# Patient Record
Sex: Male | Born: 1970 | Race: White | Hispanic: No | Smoking: Current some day smoker
Health system: Southern US, Community
[De-identification: ages and names within clinical notes are randomized; demographics above are authoritative.]

## PROBLEM LIST (undated history)

## (undated) ENCOUNTER — Emergency Department (HOSPITAL_COMMUNITY): Admission: EM | Payer: Self-pay

## (undated) DIAGNOSIS — E669 Obesity, unspecified: Secondary | ICD-10-CM

## (undated) DIAGNOSIS — C801 Malignant (primary) neoplasm, unspecified: Secondary | ICD-10-CM

## (undated) DIAGNOSIS — Z720 Tobacco use: Secondary | ICD-10-CM

## (undated) DIAGNOSIS — E119 Type 2 diabetes mellitus without complications: Secondary | ICD-10-CM

## (undated) HISTORY — PX: KNEE SURGERY: SHX244

## (undated) HISTORY — PX: TOE SURGERY: SHX1073

---

## 1999-06-02 ENCOUNTER — Encounter: Payer: Self-pay | Admitting: *Deleted

## 1999-06-02 ENCOUNTER — Emergency Department (HOSPITAL_COMMUNITY): Admission: EM | Admit: 1999-06-02 | Discharge: 1999-06-02 | Payer: Self-pay | Admitting: Podiatry

## 1999-06-13 ENCOUNTER — Emergency Department (HOSPITAL_COMMUNITY): Admission: EM | Admit: 1999-06-13 | Discharge: 1999-06-13 | Payer: Self-pay | Admitting: Emergency Medicine

## 1999-06-13 ENCOUNTER — Encounter: Payer: Self-pay | Admitting: Emergency Medicine

## 2000-01-15 ENCOUNTER — Encounter: Payer: Self-pay | Admitting: Emergency Medicine

## 2000-01-15 ENCOUNTER — Emergency Department (HOSPITAL_COMMUNITY): Admission: EM | Admit: 2000-01-15 | Discharge: 2000-01-15 | Payer: Self-pay | Admitting: Emergency Medicine

## 2000-10-24 ENCOUNTER — Emergency Department (HOSPITAL_COMMUNITY): Admission: EM | Admit: 2000-10-24 | Discharge: 2000-10-24 | Payer: Self-pay | Admitting: Emergency Medicine

## 2004-08-10 ENCOUNTER — Emergency Department: Payer: Self-pay | Admitting: General Practice

## 2004-08-27 ENCOUNTER — Emergency Department: Payer: Self-pay | Admitting: Emergency Medicine

## 2004-11-08 ENCOUNTER — Emergency Department: Payer: Self-pay | Admitting: Emergency Medicine

## 2005-02-14 ENCOUNTER — Emergency Department: Payer: Self-pay | Admitting: Emergency Medicine

## 2005-02-21 ENCOUNTER — Ambulatory Visit: Payer: Self-pay | Admitting: Orthopaedic Surgery

## 2005-03-16 ENCOUNTER — Ambulatory Visit: Payer: Self-pay | Admitting: Orthopaedic Surgery

## 2005-12-02 ENCOUNTER — Emergency Department: Payer: Self-pay | Admitting: Emergency Medicine

## 2006-05-10 ENCOUNTER — Emergency Department: Payer: Self-pay | Admitting: Emergency Medicine

## 2006-09-14 ENCOUNTER — Emergency Department: Payer: Self-pay

## 2007-07-15 ENCOUNTER — Emergency Department: Payer: Self-pay | Admitting: Emergency Medicine

## 2009-01-14 ENCOUNTER — Ambulatory Visit: Payer: Self-pay | Admitting: Internal Medicine

## 2009-02-21 ENCOUNTER — Emergency Department: Payer: Self-pay | Admitting: Emergency Medicine

## 2009-07-03 ENCOUNTER — Emergency Department: Payer: Self-pay | Admitting: Emergency Medicine

## 2010-04-17 ENCOUNTER — Emergency Department: Payer: Self-pay | Admitting: Emergency Medicine

## 2010-07-19 ENCOUNTER — Emergency Department: Payer: Self-pay | Admitting: Emergency Medicine

## 2011-04-12 ENCOUNTER — Emergency Department: Payer: Self-pay | Admitting: Emergency Medicine

## 2011-05-01 ENCOUNTER — Emergency Department (HOSPITAL_COMMUNITY): Payer: Self-pay

## 2011-05-01 ENCOUNTER — Observation Stay (HOSPITAL_COMMUNITY)
Admission: EM | Admit: 2011-05-01 | Discharge: 2011-05-02 | Disposition: A | Discharge status: Home / Self Care | Payer: Self-pay | Attending: Internal Medicine | Admitting: Internal Medicine

## 2011-05-01 DIAGNOSIS — R079 Chest pain, unspecified: Principal | ICD-10-CM | POA: Insufficient documentation

## 2011-05-01 DIAGNOSIS — F17200 Nicotine dependence, unspecified, uncomplicated: Secondary | ICD-10-CM | POA: Insufficient documentation

## 2011-05-01 DIAGNOSIS — E119 Type 2 diabetes mellitus without complications: Secondary | ICD-10-CM | POA: Insufficient documentation

## 2011-05-01 DIAGNOSIS — Z23 Encounter for immunization: Secondary | ICD-10-CM | POA: Insufficient documentation

## 2011-05-01 DIAGNOSIS — E785 Hyperlipidemia, unspecified: Secondary | ICD-10-CM | POA: Insufficient documentation

## 2011-05-01 LAB — CBC
HCT: 43.3 % (ref 39.0–52.0)
MCV: 81.7 fL (ref 78.0–100.0)
RBC: 5.3 MIL/uL (ref 4.22–5.81)
RDW: 14.3 % (ref 11.5–15.5)
WBC: 10.5 10*3/uL (ref 4.0–10.5)

## 2011-05-01 LAB — DIFFERENTIAL
Basophils Absolute: 0 10*3/uL (ref 0.0–0.1)
Basophils Relative: 0 % (ref 0–1)
Eosinophils Absolute: 0.1 10*3/uL (ref 0.0–0.7)
Eosinophils Relative: 1 % (ref 0–5)
Lymphocytes Relative: 26 % (ref 12–46)
Lymphs Abs: 2.7 10*3/uL (ref 0.7–4.0)
Monocytes Absolute: 0.5 10*3/uL (ref 0.1–1.0)
Monocytes Relative: 5 % (ref 3–12)
Neutro Abs: 7.2 10*3/uL (ref 1.7–7.7)
Neutrophils Relative %: 69 % (ref 43–77)

## 2011-05-01 LAB — POCT I-STAT TROPONIN I
Troponin i, poc: 0 ng/mL (ref 0.00–0.08)
Troponin i, poc: 0 ng/mL (ref 0.00–0.08)

## 2011-05-01 LAB — COMPREHENSIVE METABOLIC PANEL
BUN: 9 mg/dL (ref 6–23)
CO2: 23 mEq/L (ref 19–32)
Chloride: 96 mEq/L (ref 96–112)
Creatinine, Ser: 0.62 mg/dL (ref 0.50–1.35)
GFR calc non Af Amer: >90 mL/min (ref >90)
Total Bilirubin: 0.5 mg/dL (ref 0.3–1.2)

## 2011-05-01 MED ORDER — IOHEXOL 300 MG/ML  SOLN
100.0000 mL | Freq: Once | INTRAMUSCULAR | Status: AC | PRN
  Administered 2011-05-01: 100 mL via INTRAVENOUS

## 2011-05-02 DIAGNOSIS — R072 Precordial pain: Secondary | ICD-10-CM

## 2011-05-02 LAB — BASIC METABOLIC PANEL
BUN: 11 mg/dL (ref 6–23)
Chloride: 99 mEq/L (ref 96–112)
Creatinine, Ser: 0.77 mg/dL (ref 0.50–1.35)
GFR calc Af Amer: >90 mL/min (ref >90)
Glucose, Bld: 159 mg/dL — ABNORMAL HIGH (ref 70–99)

## 2011-05-02 LAB — CBC
HCT: 42.1 % (ref 39.0–52.0)
Hemoglobin: 14.2 g/dL (ref 13.0–17.0)
MCV: 82.2 fL (ref 78.0–100.0)
RDW: 14.5 % (ref 11.5–15.5)
WBC: 11.2 10*3/uL — ABNORMAL HIGH (ref 4.0–10.5)

## 2011-05-02 LAB — LIPID PANEL
Cholesterol: 203 mg/dL — ABNORMAL HIGH (ref 0–200)
LDL Cholesterol: 100 mg/dL — ABNORMAL HIGH (ref 0–99)
Triglycerides: 316 mg/dL — ABNORMAL HIGH (ref <150)

## 2011-05-02 LAB — CARDIAC PANEL(CRET KIN+CKTOT+MB+TROPI)
CK, MB: 1.3 ng/mL (ref 0.3–4.0)
Relative Index: INVALID (ref 0.0–2.5)
Troponin I: <0.3 ng/mL (ref <0.30)

## 2011-05-02 LAB — TSH: TSH: 1.104 u[IU]/mL (ref 0.350–4.500)

## 2011-05-02 LAB — DIFFERENTIAL
Eosinophils Relative: 2 % (ref 0–5)
Lymphocytes Relative: 35 % (ref 12–46)
Lymphs Abs: 3.9 10*3/uL (ref 0.7–4.0)
Monocytes Absolute: 0.8 10*3/uL (ref 0.1–1.0)

## 2011-05-02 LAB — APTT: aPTT: 29 seconds (ref 24–37)

## 2011-05-02 LAB — PROTIME-INR: INR: 0.96 (ref 0.00–1.49)

## 2011-05-03 NOTE — H&P (Signed)
NAME:  [name], [name] NO.:  [id number]  MEDICAL RECORD NO.:  [id number]  LOCATION:  WLED                         FACILITY:  [facility]  PHYSICIAN:  Ramiro Harvest, MD    DATE OF BIRTH:  1971/01/28  DATE OF ADMISSION:  05/01/2011 DATE OF DISCHARGE:                             HISTORY & PHYSICAL   The patient goes to the [facility] in [place].  CHIEF COMPLAINT:  Chest pain.  HISTORY OF PRESENT ILLNESS:  Zachary Schneider is a 40 year old Caucasian gentleman with history of occasional tobacco abuse, presenting to the ED with midsternal to left substernal chest pain, described as a pressure sensation, which occurred while at work, with some numbness and tingling in the right arm and forearm.  The patient states that the pain lasted anywhere from about 3 to 4 hours and occurred while he was sitting at his computer.  It started off in the midsternal region, then went to the left substernal region.  It was nonradiating and had some associated palpitations and shortness of breath.  The patient denies any paroxysmal nocturnal dyspnea.  No orthopnea.  No diaphoresis.  No nausea.  No vomiting.  No recent travel.  No recent surgeries.  No heavy lifting. No fever.  No chills.  No nausea.  No vomiting.  No abdominal pain.  No diarrhea.  No constipation.  No dysuria.  No generalized weakness.  The patient, however, states that he had been having upper respiratory symptoms for the past 2 to 3 weeks.  He has some associated fatigue, however, those have since resolved.  The patient presented to the ED, was given some aspirin.  The patient on the time of this admission and at the time of the interview, was chest pain free.  EKG showed a normal sinus rhythm.  Initial point of care troponin was negative.  Chest x-ray was negative.  We were called to admit the patient for further evaluation and management.  ALLERGIES:  No known drug allergies.  PAST MEDICAL  HISTORY: 1. History of testicular cancer at age 32 year old and treated at     [facility]. 2. He has had bilateral knee arthroscopies. 3. He had plastic surgery on his ears bilaterally when he was 6-year-     old.  SOCIAL HISTORY:  Occasional tobacco use.  Occasional alcohol use.  No IV drug use.  Patient works as a Clinical biochemist.  FAMILY HISTORY:  Mother alive, age 9 and healthy.  Father alive, age 30 with a questionable multiple sclerosis.  REVIEW OF SYSTEMS:  As per HPI, otherwise negative.  PHYSICAL EXAMINATION:  VITAL SIGNS:  Temperature 98.8; blood pressure 158/82, down to 136/73; pulse of 98; respirations 24, down to 18; saturating 98% on room air. GENERAL:  The patient is a well-developed, well-nourished gentleman, obese in no acute cardiopulmonary distress. HEENT:  Normocephalic, atraumatic.  Pupils equal, round, reactive to light and accommodation.  Extraocular movements intact.  Oropharynx is clear.  No lesions.  No exudates. Neck:  Supple.  No lymphadenopathy. Respiratory:  Lungs are clear to auscultation bilaterally.  No wheezes. No crackles.  No rhonchi. Cardiovascular:  Regular rhythm.  No murmurs, rubs, or gallops. Abdomen:  Soft, nontender, nondistended.  Positive bowel sounds. Extremities:  No clubbing, cyanosis, or edema. Neurologic:  The patient is alert and oriented x3.  Cranial nerves II- XII are grossly intact with no focal deficits.  ADMISSION LABORATORY DATA:  CMET:  Sodium 131, potassium 3.9, chloride 96, bicarb 23, glucose 239, BUN 9, creatinine 0.62, bilirubin 0.5, alkaline phosphatase 86, AST 14, ALT 23, protein 7.4, albumin 3.5, calcium of 9.3.  CBC with a white count of 10.5, hemoglobin 14.9, hematocrit 43.3, platelet count of 245, with an ANC of 7.2.  Point of care troponin I was 0.00.  EKG shows a normal sinus rhythm.  Chest x-ray shows no active lung disease.  Minimal atelectasis at the lung bases. CT of the head without contrast shows  normal noncontrast CT appearance of the brain.  Early metastatic disease to the brain cannot be excluded in the absence of IV contrast.  ASSESSMENT AND PLAN:  Mr. Zachary Schneider is a 40 year old gentleman, history of occasional tobacco abuse, history of obesity, presented to the ED with mid to left substernal chest pain. 1. Chest pain, questionable etiology.  Patient's risk factors include     occasional tobacco abuse and obesity.  The patient does not have     any family history of premature coronary artery disease.  The     patient denies any hypertension, hyperlipidemia, or diabetes;     however, on the patient's CMET, he does have an elevated glucose     level.  We will admit for observation, cycle cardiac enzymes q.8     hours x3.  Check a fasting lipid panel.  Check a D-dimer.  Check a     TSH.  Check a hemoglobin A1c.  Check a magnesium level.  Check     coags.  Check a 2D echo.  Place on oxygen, aspirin, morphine     sulfate, nitroglycerin and a proton pump inhibitor.  If the     patient's workup is negative, we will likely benefit from     outpatient stress test. 2. Hyperglycemia.  Check a hemoglobin A1c, follow. 3. Prophylaxis.  Lovenox for DVT prophylaxis.  Protonix for GI     prophylaxis.  It has been a pleasure taking care of Mr. Zachary Schneider.     Ramiro Harvest, MD     DT/MEDQ  D:  05/01/2011  T:  05/01/2011  Job:  [id number]  cc:   [facility]  Electronically Signed by Ramiro Harvest MD on 05/03/2011 10:39:38 AM

## 2011-05-05 NOTE — Discharge Summary (Signed)
NAMEISADOR, Zachary Schneider NO.:  [id number]  MEDICAL RECORD NO.:  [id number]  LOCATION:  1428                         FACILITY:  [facility]  PHYSICIAN:  Debbora Presto, MD DATE OF BIRTH:  [birth year]  DATE OF ADMISSION:  05/01/2011 DATE OF DISCHARGE:  05/02/2011                              DISCHARGE SUMMARY   DISCHARGE MEDICATIONS: 1. Aspirin 325 mg tablet daily. 2. Lipitor 20 mg tablet daily. 3. Metformin 500 mg tablet twice daily. 4. Nitroglycerin sublingual 0.4 mg tablet every 5 minutes as needed up     to 3 doses under the tongue.  DISCHARGE DIAGNOSES: 1. Chest pain - Acute chest syndrome ruled out. 2. Hyperlipidemia. 3. Diabetes.  DISPOSITION/FOLLOWUP:  The patient was discharged from the hospital in stable condition and will need to follow up with primary care physician in approximately 4 weeks.  He actually has a physician in [place], [facility].  On followup appointment, the patient will have to discuss with primary care physician, diabetes control.  Ensure that is EKGs are within normal limits and that no further adjustment in metformin if indicated.  In addition, he will have to follow up on blood pressure control in hospital.  There was no indication for initiating treatment, but this may be required in the future.  CONSULTATIONS:  None.  HISTORY OF PRESENT ILLNESS:  The patient is very pleasant, young Caucasian male with history of tobacco abuse who presents to emergency department with substernal chest pain that occurred while at work, with numbness and tingling in the right arm.  Pain lasted anywhere from about 3-4 hours, occurred at rest and it subsided.  No specific alleviating or aggravating factors.  No shortness of breath.  No PND.  No orthopnea. No diaphoresis.  No abdominal or urinary concerns.  PHYSICAL EXAMINATION:  VITAL SIGNS:  Temperature 97.7, blood pressure 116/72, heart rate 62, saturating 97% on room  air. GENERAL:  Not in acute distress. CARDIOVASCULAR:  Regular rate rhythm.  S1, S2 present. LUNGS:  Clear to auscultation bilaterally. ABDOMEN:  Soft, nontender, nondistended.  Bowel sounds present. EXTREMITIES:  No edema. NEUROLOGIC:  Grossly nonfocal.  LABORATORY DATA:  A1c 8.5.  Sodium 133, potassium 3.7, chloride 99, bicarb 24, BUN 11, creatinine 0.77, glucose 159.  WBC 11.2, hemoglobin 14.2, and platelets 244.  CTA negative for chest or acute cardiopulmonary events.  ASSESSMENT AND PLAN: 1. Chest pain, mostly consistent with stable angina.  ACS was ruled     out.  No events on tele overnight.  Cardiac enzymes x3 were within     normal limits.  No changes on EKG noted during the hospitalization.     The patient was provided nitroglycerin and aspirin and will need to     follow up with primary care physician for potential stress test to     be done for further evaluation, given his risk factors of tobacco     abuse, diabetes, and obesity. 2. Diabetes.  This is newly diagnosed, diabetes with A1c noted above     was 8.5.  We will initiate the patient on metformin as he is     reluctant to start with insulin.  This time  we will start 500 mg     tablet twice daily, and he will need to follow up in outpatient     setting to ensure that CBG is well controlled on current medication     regimen.  We will also provided diabetic education. 3. Hyperlipidemia.  LDL goal discussed with the patient of less than     70.  We will start Lipitor 20 mg tablet once daily.  The patient     will need a follow up to check liver function tests.  The patient     was stable to be discharged. Over 30 minutes was spent on discharging the patient.     Debbora Presto, MD     IM/MEDQ  D:  05/02/2011  T:  05/02/2011  Job:  [id number]  Electronically Signed by Debbora Presto MD on 05/05/2011 10:42:46 PM

## 2011-11-01 ENCOUNTER — Emergency Department (HOSPITAL_COMMUNITY)
Admission: EM | Admit: 2011-11-01 | Discharge: 2011-11-01 | Discharge status: Against Medical Advice | Payer: Self-pay | Attending: Emergency Medicine | Admitting: Emergency Medicine

## 2011-11-01 ENCOUNTER — Encounter (HOSPITAL_COMMUNITY): Payer: Self-pay | Admitting: Emergency Medicine

## 2011-11-01 DIAGNOSIS — Z87891 Personal history of nicotine dependence: Secondary | ICD-10-CM | POA: Insufficient documentation

## 2011-11-01 DIAGNOSIS — Z859 Personal history of malignant neoplasm, unspecified: Secondary | ICD-10-CM | POA: Insufficient documentation

## 2011-11-01 DIAGNOSIS — H538 Other visual disturbances: Secondary | ICD-10-CM | POA: Insufficient documentation

## 2011-11-01 HISTORY — DX: Obesity, unspecified: E66.9

## 2011-11-01 HISTORY — DX: Malignant (primary) neoplasm, unspecified: C80.1

## 2011-11-01 NOTE — ED Notes (Signed)
States that he has blurry vision since he woke up this am no pain he states no head injury no recent loc thought it would go away it has not

## 2011-11-01 NOTE — ED Notes (Signed)
Pt presents with onset of blurred vision to L eye when he awoke this morning.  Pt describes "like a film over my eye".  Pt denies any pain, denies any numbness to face.

## 2011-11-01 NOTE — ED Notes (Signed)
Checked patient eyes pts was 20/30 in both eyes and 20/30 in the right eye both patient was 20/70 left eye

## 2011-11-01 NOTE — ED Provider Notes (Signed)
History  Scribed for Hilario Quarry, MD, the patient was seen in room STRE7/STRE7. This chart was scribed by Candelaria Stagers. The patient's care started at 1:20 PM    CSN: [id number]  Arrival date & time 11/01/11  1202   First MD Initiated Contact with Patient 11/01/11 1319      Chief Complaint  Patient presents with  . Eye Pain    HPI Zachary Schneider is a 41 y.o. male who presents to the Emergency Department complaining of blurred vision of the left eye that started this morning.  He denies injury or eye pain.  He has h/o diabetes.  He currently does not have HTN.    Past Medical History  Diagnosis Date  . Cancer   . Obese     History reviewed. No pertinent past surgical history.  No family history on file.  History  Substance Use Topics  . Smoking status: Former Games developer  . Smokeless tobacco: Not on file  . Alcohol Use: Yes      Review of Systems  Eyes: Positive for visual disturbance (blurry). Negative for pain.  All other systems reviewed and are negative.    Allergies  Review of patient's allergies indicates no known allergies.  Home Medications  No current outpatient prescriptions on file.  BP 139/80  Pulse 76  Temp 98.5 F (36.9 C)  Resp 16  SpO2 96%  Physical Exam  Nursing note and vitals reviewed. Constitutional: He appears well-developed and well-nourished.  HENT:  Head: Normocephalic and atraumatic.  Eyes: EOM are normal. Pupils are equal, round, and reactive to light. Right eye exhibits no discharge. Left eye exhibits no discharge.  Neck: Normal range of motion. Neck supple.  Musculoskeletal: Normal range of motion.  Neurological: He is alert.  Psychiatric: He has a normal mood and affect. His behavior is normal.    ED Course  Procedures   DIAGNOSTIC STUDIES: Oxygen Saturation is 96% on room air, normal by my interpretation.    COORDINATION OF CARE:  Visual Acuity test: 20/70 Left; 20/30 Right 20/30 Both    Labs Reviewed - No  data to display No results found.   No diagnosis found.   I personally performed the services described in this documentation, which was scribed in my presence. The recorded information has been reviewed and considered.  MDM   Patient with order to remove to eye room. Patient left prior to being moved to eye room for full ophthalmologic exam      Hilario Quarry, MD 11/01/11 [phone]

## 2012-04-04 ENCOUNTER — Emergency Department: Payer: Self-pay | Admitting: Emergency Medicine

## 2012-04-23 ENCOUNTER — Encounter (HOSPITAL_COMMUNITY): Payer: Self-pay | Admitting: Emergency Medicine

## 2012-04-23 ENCOUNTER — Emergency Department (HOSPITAL_COMMUNITY)
Admission: EM | Admit: 2012-04-23 | Discharge: 2012-04-23 | Disposition: A | Discharge status: Home / Self Care | Payer: Self-pay | Attending: Emergency Medicine | Admitting: Emergency Medicine

## 2012-04-23 DIAGNOSIS — Y929 Unspecified place or not applicable: Secondary | ICD-10-CM | POA: Insufficient documentation

## 2012-04-23 DIAGNOSIS — L0390 Cellulitis, unspecified: Secondary | ICD-10-CM

## 2012-04-23 DIAGNOSIS — Y939 Activity, unspecified: Secondary | ICD-10-CM | POA: Insufficient documentation

## 2012-04-23 DIAGNOSIS — D499 Neoplasm of unspecified behavior of unspecified site: Secondary | ICD-10-CM | POA: Insufficient documentation

## 2012-04-23 DIAGNOSIS — X58XXXA Exposure to other specified factors, initial encounter: Secondary | ICD-10-CM | POA: Insufficient documentation

## 2012-04-23 DIAGNOSIS — Z87891 Personal history of nicotine dependence: Secondary | ICD-10-CM | POA: Insufficient documentation

## 2012-04-23 DIAGNOSIS — IMO0002 Reserved for concepts with insufficient information to code with codable children: Secondary | ICD-10-CM | POA: Insufficient documentation

## 2012-04-23 DIAGNOSIS — F1010 Alcohol abuse, uncomplicated: Secondary | ICD-10-CM | POA: Insufficient documentation

## 2012-04-23 MED ORDER — DIPHENHYDRAMINE HCL 25 MG PO CAPS
50.0000 mg | ORAL_CAPSULE | Freq: Once | ORAL | Status: AC
  Administered 2012-04-23: 50 mg via ORAL
  Filled 2012-04-23: qty 2

## 2012-04-23 MED ORDER — HYDROCODONE-ACETAMINOPHEN 5-325 MG PO TABS: 2.0000 | ORAL_TABLET | ORAL | Status: DC | PRN

## 2012-04-23 MED ORDER — DIPHENHYDRAMINE HCL 25 MG PO TABS: 25.0000 mg | ORAL_TABLET | Freq: Four times a day (QID) | ORAL | Status: DC

## 2012-04-23 MED ORDER — SULFAMETHOXAZOLE-TRIMETHOPRIM 800-160 MG PO TABS: 1.0000 | ORAL_TABLET | Freq: Two times a day (BID) | ORAL | Status: DC

## 2012-04-23 NOTE — ED Notes (Signed)
Pt states that around 1430 today, he began noticing a red, raised area on his arms that he thinks is an insect bite.  Pt does not remember being stung.

## 2012-04-23 NOTE — ED Provider Notes (Signed)
History  This chart was scribed for non-physician practitioner working with Richardean Canal, MD by Shari Heritage. This patient was seen in room WTR6/WTR6 and the patient's care was started at 1832.   CSN: [id number]  Arrival date & time 04/23/12  1557   First MD Initiated Contact with Patient 04/23/12 1832      Chief Complaint  Patient presents with  . Insect Bite    The history is provided by the patient. No language interpreter was used.   HPI Comments: Zachary Schneider is a 41 y.o. male who presents to the Emergency Department complaining of erythematous area to the right forearm with associated burning, moderate pain onset 4 hours ago. Patient states that the pain is localized to the forearm. Patient denies throat closing, trouble swallowing, SOB or wheezing. He may have been stung by something, but he did not see an insect so is unsure of the exact mechanism. Pain is aggravated mildly with palpation. Patient hasn't tried anything for symptom relief. Patient states that he has had a similar, more severe reaction after a spider bite. He was eventually treated with antibiotics at that time. Patient has a medical history of cancer. Patient has an allergy to bee venom.  Past Medical History  Diagnosis Date  . Cancer   . Obese     Past Surgical History  Procedure Date  . Knee surgery     No family history on file.  History  Substance Use Topics  . Smoking status: Former Games developer  . Smokeless tobacco: Not on file  . Alcohol Use: Yes      Review of Systems  HENT: Negative for trouble swallowing.   Respiratory: Negative for shortness of breath and wheezing.   Skin: Positive for rash.  All other systems reviewed and are negative.    Allergies  Review of patient's allergies indicates no known allergies.  Home Medications   Current Outpatient Rx  Name Route Sig Dispense Refill  . OVER THE COUNTER MEDICATION Oral Take 1 tablet by mouth once. OTC pain med.      BP 118/71   Pulse 103  Temp 99.1 F (37.3 C) (Oral)  Resp 23  SpO2 97%  Physical Exam  Nursing note and vitals reviewed. Constitutional: He is oriented to person, place, and time. He appears well-developed and well-nourished. No distress.  HENT:  Head: Normocephalic and atraumatic.  Eyes: Conjunctivae normal and EOM are normal.  Neck: Normal range of motion. Neck supple.  Cardiovascular: Normal rate and regular rhythm.  Exam reveals no gallop and no friction rub.   No murmur heard. Pulmonary/Chest: Effort normal and breath sounds normal. He has no wheezes. He has no rales. He exhibits no tenderness.  Abdominal: Soft. He exhibits no distension.  Musculoskeletal: Normal range of motion.       Strength and sensation is equal and intact bilaterally of upper extremities.  Neurological: He is alert and oriented to person, place, and time. Coordination normal.       Speech is goal-oriented. Moves limbs without ataxia.   Skin: Skin is warm and dry. He is not diaphoretic. There is erythema.       4x4 cm area of erythema to volar aspect of right forearm.  Psychiatric: He has a normal mood and affect. His behavior is normal.    ED Course  Procedures (including critical care time) DIAGNOSTIC STUDIES: Oxygen Saturation is 97% on room air, adequate by my interpretation.    COORDINATION OF CARE: 7:19pm- Patient informed  of current plan for treatment and evaluation and agrees with plan at this time. Will administer Benadryl in the ED and discharge patient home with prescriptions for Vicodin 5-325mg , Benadryl 25 mg and Septra DS 800-160 mg.   Labs Reviewed - No data to display  No results found.   1. Cellulitis       MDM  Patient likely has cellulitis without abscess. I will treat him with Bactrim and her should return with worsening or concerning symptoms. Patient is afebrile. No further evaluation needed at this time.   This chart was written by a scribe in my presence. I have reviewed and agree  with the note.    Emilia Beck, PA-C 04/24/12 0136

## 2012-04-24 NOTE — ED Provider Notes (Signed)
Medical screening examination/treatment/procedure(s) were performed by non-physician practitioner and as supervising physician I was immediately available for consultation/collaboration.   Richardean Canal, MD 04/24/12 [phone]

## 2012-07-10 ENCOUNTER — Emergency Department (HOSPITAL_COMMUNITY)
Admission: EM | Admit: 2012-07-10 | Discharge: 2012-07-10 | Disposition: A | Discharge status: Home / Self Care | Payer: Worker's Compensation | Attending: Emergency Medicine | Admitting: Emergency Medicine

## 2012-07-10 ENCOUNTER — Encounter (HOSPITAL_COMMUNITY): Payer: Self-pay | Admitting: Emergency Medicine

## 2012-07-10 ENCOUNTER — Emergency Department (HOSPITAL_COMMUNITY): Payer: Worker's Compensation

## 2012-07-10 DIAGNOSIS — S8390XA Sprain of unspecified site of unspecified knee, initial encounter: Secondary | ICD-10-CM

## 2012-07-10 DIAGNOSIS — Z87891 Personal history of nicotine dependence: Secondary | ICD-10-CM | POA: Insufficient documentation

## 2012-07-10 DIAGNOSIS — Y9389 Activity, other specified: Secondary | ICD-10-CM | POA: Insufficient documentation

## 2012-07-10 DIAGNOSIS — Y929 Unspecified place or not applicable: Secondary | ICD-10-CM | POA: Insufficient documentation

## 2012-07-10 DIAGNOSIS — W010XXA Fall on same level from slipping, tripping and stumbling without subsequent striking against object, initial encounter: Secondary | ICD-10-CM | POA: Insufficient documentation

## 2012-07-10 DIAGNOSIS — IMO0002 Reserved for concepts with insufficient information to code with codable children: Secondary | ICD-10-CM | POA: Insufficient documentation

## 2012-07-10 DIAGNOSIS — Z859 Personal history of malignant neoplasm, unspecified: Secondary | ICD-10-CM | POA: Insufficient documentation

## 2012-07-10 DIAGNOSIS — E669 Obesity, unspecified: Secondary | ICD-10-CM | POA: Insufficient documentation

## 2012-07-10 MED ORDER — IBUPROFEN 600 MG PO TABS: 600.0000 mg | ORAL_TABLET | Freq: Four times a day (QID) | ORAL | Status: DC | PRN

## 2012-07-10 NOTE — ED Provider Notes (Signed)
History     CSN: [id number]  Arrival date & time 07/10/12  1201   First MD Initiated Contact with Patient 07/10/12 1256      Chief Complaint  Patient presents with  . Knee Pain    (Consider location/radiation/quality/duration/timing/severity/associated sxs/prior treatment) HPI Comments: Patient presents with complaint of right knee pain beginning acutely yesterday after he tripped on a rug. Patient has been ambulatory. He states that his knee was swollen last night. He denies numbness, tingling, weakness of his lower extremity. He has been using ice and Tylenol without relief of pain. Onset acute. Course is constant. Walking and movement makes the pain worse. Nothing makes it better.  Patient is a 42 y.o. male presenting with knee pain. The history is provided by the patient.  Knee Pain Associated symptoms include arthralgias and joint swelling. Pertinent negatives include no neck pain, numbness or weakness.    Past Medical History  Diagnosis Date  . Cancer   . Obese     Past Surgical History  Procedure Date  . Knee surgery     History reviewed. No pertinent family history.  History  Substance Use Topics  . Smoking status: Former Games developer  . Smokeless tobacco: Not on file  . Alcohol Use: Yes      Review of Systems  Constitutional: Negative for activity change.  HENT: Negative for neck pain.   Musculoskeletal: Positive for joint swelling, arthralgias and gait problem. Negative for back pain.  Skin: Negative for wound.  Neurological: Negative for weakness and numbness.    Allergies  Bee venom and Mushroom extract complex  Home Medications   Current Outpatient Rx  Name  Route  Sig  Dispense  Refill  . ACETAMINOPHEN 500 MG PO TABS   Oral   Take 500 mg by mouth every 6 (six) hours as needed. pain         . IBUPROFEN 600 MG PO TABS   Oral   Take 1 tablet (600 mg total) by mouth every 6 (six) hours as needed for pain.   20 tablet   0     BP 148/81   Pulse 75  Temp 98.2 F (36.8 C) (Oral)  Resp 18  SpO2 96%  Physical Exam  Nursing note and vitals reviewed. Constitutional: He appears well-developed and well-nourished.  HENT:  Head: Normocephalic and atraumatic.  Eyes: Conjunctivae normal are normal.  Neck: Normal range of motion. Neck supple.  Cardiovascular: Normal pulses.   Musculoskeletal: He exhibits tenderness. He exhibits no edema.       Right hip: Normal.       Right knee: He exhibits effusion (Mild). He exhibits normal range of motion and no swelling. tenderness found. Medial joint line and lateral joint line tenderness noted.       Right ankle: Normal.  Neurological: He is alert. No sensory deficit.       Motor, sensation, and vascular distal to the injury is fully intact.   Skin: Skin is warm and dry.  Psychiatric: He has a normal mood and affect.    ED Course  Procedures (including critical care time)  Labs Reviewed - No data to display Dg Knee Complete 4 Views Right  07/10/2012  *RADIOLOGY REPORT*  Clinical Data: Knee pain and swelling  RIGHT KNEE - COMPLETE 4+ VIEW  Comparison: None.  Findings:  Frontal, lateral, and bilateral oblique views were obtained.  There is no fracture, dislocation, or effusion.  There is extensive calcification in the medial collateral ligament. There  is patellofemoral joint spurring and narrowing.  No erosive change or intra-articular calcifications.  IMPRESSION:  Medial collateral ligament calcification.  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  No fracture or effusion.   Original Report Authenticated By: Bretta Bang, M.D.      1. Knee sprain     1:17 PM Patient seen and examined. Work-up initiated. Medications ordered.   Vital signs reviewed and are as follows: Filed Vitals:   07/10/12 1219  BP: 148/81  Pulse: 75  Temp: 98.2 F (36.8 C)  Resp: 18   1:18 PM Patient was counseled on RICE protocol and told to rest  injury, use ice for no longer than 15 minutes every hour, compress the area, and elevate above the level of their heart as much as possible to reduce swelling.  Questions answered.  Patient verbalized understanding.    Knee sleeve by orthopedic tech.  MDM  Knee injury, x-rays negative, patient is ambulatory. He is refused crutches. Rice protocol and conservative treatment indicated. Orthopedic followup if not improved in one week.        Renne Crigler, [place] 07/10/12 1320

## 2012-07-10 NOTE — ED Notes (Signed)
Tripped and fell yesterday landing on right knee.

## 2012-07-11 NOTE — ED Provider Notes (Signed)
Medical screening examination/treatment/procedure(s) were performed by non-physician practitioner and as supervising physician I was immediately available for consultation/collaboration.   Ger Ringenberg, MD 07/11/12 0705 

## 2012-09-12 ENCOUNTER — Emergency Department: Payer: Self-pay | Admitting: Emergency Medicine

## 2017-03-18 ENCOUNTER — Emergency Department (HOSPITAL_BASED_OUTPATIENT_CLINIC_OR_DEPARTMENT_OTHER)
Admission: EM | Admit: 2017-03-18 | Discharge: 2017-03-18 | Disposition: A | Discharge status: Home / Self Care | Payer: Self-pay | Attending: Emergency Medicine | Admitting: Emergency Medicine

## 2017-03-18 ENCOUNTER — Encounter (HOSPITAL_BASED_OUTPATIENT_CLINIC_OR_DEPARTMENT_OTHER): Payer: Self-pay | Admitting: Emergency Medicine

## 2017-03-18 ENCOUNTER — Emergency Department (HOSPITAL_BASED_OUTPATIENT_CLINIC_OR_DEPARTMENT_OTHER): Payer: Self-pay

## 2017-03-18 DIAGNOSIS — J209 Acute bronchitis, unspecified: Secondary | ICD-10-CM | POA: Insufficient documentation

## 2017-03-18 DIAGNOSIS — Z8547 Personal history of malignant neoplasm of testis: Secondary | ICD-10-CM | POA: Insufficient documentation

## 2017-03-18 DIAGNOSIS — E119 Type 2 diabetes mellitus without complications: Secondary | ICD-10-CM | POA: Insufficient documentation

## 2017-03-18 DIAGNOSIS — Z79899 Other long term (current) drug therapy: Secondary | ICD-10-CM | POA: Insufficient documentation

## 2017-03-18 DIAGNOSIS — Z87891 Personal history of nicotine dependence: Secondary | ICD-10-CM | POA: Insufficient documentation

## 2017-03-18 DIAGNOSIS — Z794 Long term (current) use of insulin: Secondary | ICD-10-CM | POA: Insufficient documentation

## 2017-03-18 HISTORY — DX: Type 2 diabetes mellitus without complications: E11.9

## 2017-03-18 MED ORDER — IPRATROPIUM-ALBUTEROL 0.5-2.5 (3) MG/3ML IN SOLN
3.0000 mL | Freq: Four times a day (QID) | RESPIRATORY_TRACT | Status: DC
  Administered 2017-03-18: 3 mL via RESPIRATORY_TRACT
  Filled 2017-03-18: qty 3

## 2017-03-18 MED ORDER — ALBUTEROL SULFATE HFA 108 (90 BASE) MCG/ACT IN AERS: 1.0000 | INHALATION_SPRAY | Freq: Four times a day (QID) | RESPIRATORY_TRACT | 0 refills | Status: DC | PRN

## 2017-03-18 MED ORDER — PREDNISONE 10 MG PO TABS: 60.0000 mg | ORAL_TABLET | Freq: Every day | ORAL | 0 refills | Status: DC

## 2017-03-18 MED ORDER — PREDNISONE 50 MG PO TABS
60.0000 mg | ORAL_TABLET | Freq: Once | ORAL | Status: AC
  Administered 2017-03-18: 60 mg via ORAL
  Filled 2017-03-18: qty 1

## 2017-03-18 MED FILL — predniSONE 10 MG TABS: 10 | 5 days supply | Qty: 30 | Fill #0

## 2017-03-18 NOTE — ED Triage Notes (Signed)
Patient states that he has had a cough x 2 -3 days. The patient reports that he has a "wheeze" and tries to cough stuff up but sometimes it will not come up

## 2017-03-18 NOTE — ED Provider Notes (Signed)
[facility] DEPT [facility] Provider Note   CSN: [id number] Arrival date & time: 03/18/17  0757     History   Chief Complaint Chief Complaint  Patient presents with  . Cough    HPI Zachary Schneider is a 46 y.o. male.  HPI  SUBJECTIVE:  Zachary Schneider is a 46 y.o. male seen urgently with cough and shortness of breath for several days that is getting worse. Pt has hx of DM, remote hx of testicular CA and smokes 1/2 ppd. Pt denies any primary lung disease or cardiac disease. Pt has no hx of PE, DVT and denies any exogenous hormone (testosterone / estrogen) use, long distance travels or surgery in the past 6 weeks, active cancer, recent immobilization.   Past Medical History:  Diagnosis Date  . Cancer ([place])   . Diabetes mellitus without complication ([place])   . Obese     There are no active problems to display for this patient.   Past Surgical History:  Procedure Laterality Date  . KNEE SURGERY         Home Medications    Prior to Admission medications   Medication Sig Start Date End Date Taking? Authorizing Provider  insulin NPH-regular Human (NOVOLIN 70/30) (70-30) 100 UNIT/ML injection Inject into the skin.   Yes [provider]  acetaminophen (TYLENOL) 500 MG tablet Take 500 mg by mouth every 6 (six) hours as needed. pain    [provider]  albuterol (PROVENTIL HFA;VENTOLIN HFA) 108 (90 Base) MCG/ACT inhaler Inhale 1-2 puffs into the lungs every 6 (six) hours as needed for wheezing or shortness of breath. 03/18/17   Varney Biles, MD  ibuprofen (ADVIL,MOTRIN) 600 MG tablet Take 1 tablet (600 mg total) by mouth every 6 (six) hours as needed for pain. 07/10/12   Carlisle Cater, PA-C  predniSONE (DELTASONE) 10 MG tablet Take 6 tablets (60 mg total) by mouth daily. 03/18/17   Varney Biles, MD    Family History History reviewed. No pertinent family history.  Social History Social History  Substance Use Topics  . Smoking status: Former Research scientist (life sciences)  .  Smokeless tobacco: Never Used  . Alcohol use Yes     Allergies   Bee venom and Mushroom extract complex   Review of Systems Review of Systems  Constitutional: Positive for activity change.  Respiratory: Positive for cough and shortness of breath. Negative for chest tightness.   Cardiovascular: Negative for chest pain and leg swelling.  Allergic/Immunologic: Negative for immunocompromised state.  All other systems reviewed and are negative.    Physical Exam Updated Vital Signs BP (!) 146/86 (BP Location: Left Arm)   Pulse 96   Temp 98.4 F (36.9 C) (Oral)   Resp 20   Ht 6' (1.829 m)   Wt (!) 143 kg (315 lb 4.1 oz)   SpO2 98%   BMI 42.76 kg/m   Physical Exam  Constitutional: He is oriented to person, place, and time. He appears well-developed.  HENT:  Head: Atraumatic.  Neck: Neck supple.  Cardiovascular: Normal rate.   Pulmonary/Chest: Effort normal. He has wheezes. He exhibits no tenderness.  Musculoskeletal: He exhibits no edema or tenderness.  Neurological: He is alert and oriented to person, place, and time.  Skin: Skin is warm.  Nursing note and vitals reviewed. OBJECTIVE:  The patient appears alert, well appearing, and in no distress, overweight, acyanotic, in no respiratory distress and well hydrated. ENT: ENT exam normal, no neck nodes or sinus tenderness CHEST:wheezing noted in all lung  fields   ED Treatments / Results  Labs (all labs ordered are listed, but only abnormal results are displayed) Labs Reviewed - No data to display  EKG  EKG Interpretation None       Radiology Dg Chest 2 View  Result Date: 03/18/2017 CLINICAL DATA:  Cough and chest congestion.  Shortness of breath. EXAM: CHEST  2 VIEW COMPARISON:  05/01/2011 FINDINGS: The heart size and mediastinal contours are within normal limits. Both lungs are clear. The visualized skeletal structures are unremarkable. IMPRESSION: Normal exam. Electronically Signed   By: Lorriane Shire M.D.    On: 03/18/2017 08:30    Procedures Procedures (including critical care time) Smoking cessation instruction/counseling given:  counseled patient on the dangers of tobacco use, advised patient to stop smoking, and reviewed strategies to maximize success. Discussion 2-3 min    Medications Ordered in ED Medications  ipratropium-albuterol (DUONEB) 0.5-2.5 (3) MG/3ML nebulizer solution 3 mL (3 mLs Nebulization Given 03/18/17 0921)  predniSONE (DELTASONE) tablet 60 mg (60 mg Oral Given 03/18/17 0919)     Initial Impression / Assessment and Plan / ED Course  I have reviewed the triage vital signs and the nursing notes.  Pertinent labs & imaging results that were available during my care of the patient were reviewed by me and considered in my medical decision making (see chart for details).  Clinical Course as of Mar 19 1015  Mon Mar 18, 2017  1016 Repeat exam reveals clearing of wheezing in all lung fields. Patient is not in any respiratory distress nor is there hypoxia.  Strict ER return precautions have been discussed, and patient is agreeing with the plan and is comfortable with the workup done and the recommendations from the ER.   [AN]    Clinical Course User Index [AN] Varney Biles, MD   Pt comes in with cc of cough. He is noted to be wheezing. Pt is a smoker and has hx of HTN, DM. No cardiac dz. No rales on exam. Pt has no PE risk factors, pretest probability for PE is low and he is PERC neg. Given the smoking hx, this could be new diagnosis of COPD. Will give nebs, get CXR and reassess.    Final Clinical Impressions(s) / ED Diagnoses   Final diagnoses:  Acute bronchitis, unspecified organism    New Prescriptions New Prescriptions   ALBUTEROL (PROVENTIL HFA;VENTOLIN HFA) 108 (90 BASE) MCG/ACT INHALER    Inhale 1-2 puffs into the lungs every 6 (six) hours as needed for wheezing or shortness of breath.   PREDNISONE (DELTASONE) 10 MG TABLET    Take 6 tablets (60 mg total) by  mouth daily.     Varney Biles, MD 03/18/17 1019

## 2017-03-18 NOTE — Discharge Instructions (Signed)
We saw you in the ER for your breathing related complains. We gave you some breathing treatments in the ER, and seems like your symptoms have improved. °Please take albuterol as needed every 4 hours. °Please take the medications prescribed. °Please refrain from smoking or smoke exposure. °Please see a primary care doctor in 1 week. °Return to the ER if your symptoms worsen. ° °

## 2017-07-14 ENCOUNTER — Encounter (HOSPITAL_COMMUNITY): Payer: Self-pay | Admitting: Emergency Medicine

## 2017-07-14 ENCOUNTER — Observation Stay (HOSPITAL_COMMUNITY)
Admission: EM | Admit: 2017-07-14 | Discharge: 2017-07-15 | Disposition: A | Discharge status: Home / Self Care | Payer: Self-pay | Attending: Internal Medicine | Admitting: Internal Medicine

## 2017-07-14 ENCOUNTER — Other Ambulatory Visit: Payer: Self-pay

## 2017-07-14 ENCOUNTER — Emergency Department (HOSPITAL_COMMUNITY): Payer: No Typology Code available for payment source

## 2017-07-14 DIAGNOSIS — R739 Hyperglycemia, unspecified: Secondary | ICD-10-CM

## 2017-07-14 DIAGNOSIS — K567 Ileus, unspecified: Secondary | ICD-10-CM | POA: Diagnosis present

## 2017-07-14 DIAGNOSIS — Z794 Long term (current) use of insulin: Secondary | ICD-10-CM | POA: Insufficient documentation

## 2017-07-14 DIAGNOSIS — Z23 Encounter for immunization: Secondary | ICD-10-CM | POA: Insufficient documentation

## 2017-07-14 DIAGNOSIS — Z79899 Other long term (current) drug therapy: Secondary | ICD-10-CM | POA: Insufficient documentation

## 2017-07-14 DIAGNOSIS — K8020 Calculus of gallbladder without cholecystitis without obstruction: Secondary | ICD-10-CM | POA: Insufficient documentation

## 2017-07-14 DIAGNOSIS — F17210 Nicotine dependence, cigarettes, uncomplicated: Secondary | ICD-10-CM | POA: Insufficient documentation

## 2017-07-14 DIAGNOSIS — E1165 Type 2 diabetes mellitus with hyperglycemia: Secondary | ICD-10-CM | POA: Insufficient documentation

## 2017-07-14 DIAGNOSIS — E119 Type 2 diabetes mellitus without complications: Secondary | ICD-10-CM

## 2017-07-14 DIAGNOSIS — K56600 Partial intestinal obstruction, unspecified as to cause: Principal | ICD-10-CM | POA: Insufficient documentation

## 2017-07-14 DIAGNOSIS — Z859 Personal history of malignant neoplasm, unspecified: Secondary | ICD-10-CM | POA: Insufficient documentation

## 2017-07-14 DIAGNOSIS — K56609 Unspecified intestinal obstruction, unspecified as to partial versus complete obstruction: Secondary | ICD-10-CM | POA: Diagnosis present

## 2017-07-14 LAB — URINALYSIS, ROUTINE W REFLEX MICROSCOPIC
BACTERIA UA: NONE SEEN
Bilirubin Urine: NEGATIVE
Glucose, UA: >=500 mg/dL — AB
HGB URINE DIPSTICK: NEGATIVE
Ketones, ur: 80 mg/dL — AB
Leukocytes, UA: NEGATIVE
NITRITE: NEGATIVE
PH: 5 (ref 5.0–8.0)
Protein, ur: 30 mg/dL — AB
RBC / HPF: NONE SEEN RBC/hpf (ref 0–5)
SPECIFIC GRAVITY, URINE: 1.028 (ref 1.005–1.030)

## 2017-07-14 LAB — COMPREHENSIVE METABOLIC PANEL
ALBUMIN: 3.5 g/dL (ref 3.5–5.0)
ALK PHOS: 74 U/L (ref 38–126)
ALT: 48 U/L (ref 17–63)
AST: 35 U/L (ref 15–41)
BILIRUBIN TOTAL: 1.9 mg/dL — AB (ref 0.3–1.2)
BUN: 18 mg/dL (ref 6–20)
CO2: 20 mmol/L — ABNORMAL LOW (ref 22–32)
CREATININE: 0.97 mg/dL (ref 0.61–1.24)
Calcium: 8.3 mg/dL — ABNORMAL LOW (ref 8.9–10.3)
Chloride: 88 mmol/L — ABNORMAL LOW (ref 101–111)
GFR calc Af Amer: >60 mL/min (ref >60)
GFR calc non Af Amer: >60 mL/min (ref >60)
Glucose, Bld: 397 mg/dL — ABNORMAL HIGH (ref 65–99)
Potassium: 3.6 mmol/L (ref 3.5–5.1)
Sodium: 129 mmol/L — ABNORMAL LOW (ref 135–145)
TOTAL PROTEIN: 7.6 g/dL (ref 6.5–8.1)

## 2017-07-14 LAB — BLOOD GAS, VENOUS
Acid-base deficit: 6.3 mmol/L — ABNORMAL HIGH (ref 0.0–2.0)
BICARBONATE: 20.6 mmol/L (ref 20.0–28.0)
FIO2: 21
O2 SAT: 97.3 %
PCO2 VEN: 26.2 mmHg — AB (ref 44.0–60.0)
PO2 VEN: 94.5 mmHg — AB (ref 32.0–45.0)
pH, Ven: 7.433 — ABNORMAL HIGH (ref 7.250–7.430)

## 2017-07-14 LAB — CBC WITH DIFFERENTIAL/PLATELET
BASOS ABS: 0 10*3/uL (ref 0.0–0.1)
BASOS PCT: 0 %
EOS ABS: 0 10*3/uL (ref 0.0–0.7)
EOS PCT: 0 %
HEMATOCRIT: 50.2 % (ref 39.0–52.0)
Hemoglobin: 16.8 g/dL (ref 13.0–17.0)
Lymphocytes Relative: 7 %
Lymphs Abs: 0.7 10*3/uL (ref 0.7–4.0)
MCH: 27.6 pg (ref 26.0–34.0)
MCHC: 33.5 g/dL (ref 30.0–36.0)
MCV: 82.4 fL (ref 78.0–100.0)
MONO ABS: 0.7 10*3/uL (ref 0.1–1.0)
MONOS PCT: 8 %
Neutro Abs: 8 10*3/uL — ABNORMAL HIGH (ref 1.7–7.7)
Neutrophils Relative %: 85 %
PLATELETS: 186 10*3/uL (ref 150–400)
RBC: 6.09 MIL/uL — ABNORMAL HIGH (ref 4.22–5.81)
RDW: 14.9 % (ref 11.5–15.5)
WBC: 9.5 10*3/uL (ref 4.0–10.5)

## 2017-07-14 LAB — ETHANOL

## 2017-07-14 LAB — LIPASE, BLOOD: Lipase: 23 U/L (ref 11–51)

## 2017-07-14 MED ORDER — SODIUM CHLORIDE 0.9 % IV SOLN
INTRAVENOUS | Status: DC
  Administered 2017-07-14: 21:00:00 via INTRAVENOUS

## 2017-07-14 MED ORDER — ONDANSETRON HCL 4 MG/2ML IJ SOLN
4.0000 mg | Freq: Once | INTRAMUSCULAR | Status: AC
  Administered 2017-07-14: 4 mg via INTRAVENOUS
  Filled 2017-07-14: qty 2

## 2017-07-14 MED ORDER — LACTATED RINGERS IV BOLUS (SEPSIS)
500.0000 mL | Freq: Once | INTRAVENOUS | Status: AC
  Administered 2017-07-14: 500 mL via INTRAVENOUS

## 2017-07-14 MED ORDER — ONDANSETRON HCL 4 MG/2ML IJ SOLN: 4.0000 mg | Freq: Four times a day (QID) | INTRAMUSCULAR | Status: DC | PRN

## 2017-07-14 MED ORDER — SODIUM CHLORIDE 0.9 % IV BOLUS (SEPSIS)
1000.0000 mL | Freq: Once | INTRAVENOUS | Status: AC
  Administered 2017-07-14: 1000 mL via INTRAVENOUS

## 2017-07-14 MED ORDER — MORPHINE SULFATE (PF) 4 MG/ML IV SOLN
4.0000 mg | Freq: Once | INTRAVENOUS | Status: AC
Start: 2017-07-14 — End: 2017-07-14
  Administered 2017-07-14: 4 mg via INTRAVENOUS
  Filled 2017-07-14: qty 1

## 2017-07-14 MED ORDER — INSULIN ASPART 100 UNIT/ML ~~LOC~~ SOLN
0.0000 [IU] | Freq: Three times a day (TID) | SUBCUTANEOUS | Status: DC
  Administered 2017-07-15: 5 [IU] via SUBCUTANEOUS
  Administered 2017-07-15: 4 [IU] via SUBCUTANEOUS

## 2017-07-14 MED ORDER — IOPAMIDOL (ISOVUE-300) INJECTION 61%
100.0000 mL | Freq: Once | INTRAVENOUS | Status: AC | PRN
  Administered 2017-07-14: 100 mL via INTRAVENOUS

## 2017-07-14 MED ORDER — FAMOTIDINE IN NACL 20-0.9 MG/50ML-% IV SOLN
20.0000 mg | Freq: Once | INTRAVENOUS | Status: AC
  Administered 2017-07-14: 20 mg via INTRAVENOUS
  Filled 2017-07-14: qty 50

## 2017-07-14 MED ORDER — ENOXAPARIN SODIUM 40 MG/0.4ML ~~LOC~~ SOLN
40.0000 mg | SUBCUTANEOUS | Status: DC
  Filled 2017-07-14: qty 0.4

## 2017-07-14 MED ORDER — ACETAMINOPHEN 325 MG PO TABS: 650.0000 mg | ORAL_TABLET | Freq: Four times a day (QID) | ORAL | Status: DC | PRN

## 2017-07-14 MED ORDER — GI COCKTAIL ~~LOC~~
30.0000 mL | Freq: Once | ORAL | Status: AC
  Administered 2017-07-14: 30 mL via ORAL
  Filled 2017-07-14: qty 30

## 2017-07-14 MED ORDER — PNEUMOCOCCAL VAC POLYVALENT 25 MCG/0.5ML IJ INJ
0.5000 mL | INJECTION | INTRAMUSCULAR | Status: AC
  Administered 2017-07-15: 0.5 mL via INTRAMUSCULAR
  Filled 2017-07-14: qty 0.5

## 2017-07-14 MED ORDER — ONDANSETRON HCL 4 MG PO TABS: 4.0000 mg | ORAL_TABLET | Freq: Four times a day (QID) | ORAL | Status: DC | PRN

## 2017-07-14 MED ORDER — ACETAMINOPHEN 650 MG RE SUPP: 650.0000 mg | Freq: Four times a day (QID) | RECTAL | Status: DC | PRN

## 2017-07-14 MED ORDER — INFLUENZA VAC SPLIT QUAD 0.5 ML IM SUSY
0.5000 mL | PREFILLED_SYRINGE | INTRAMUSCULAR | Status: AC
  Administered 2017-07-15: 0.5 mL via INTRAMUSCULAR
  Filled 2017-07-14: qty 0.5

## 2017-07-14 NOTE — H&P (Signed)
[facility] H&P    Patient Demographics:    Zachary Schneider, is a 47 y.o. male  MRN: [id number]  DOB - Jan 31, 1971  Admit Date - 07/14/2017  Referring MD/NP/PA: Dr. Vanita Panda  Outpatient Primary MD for the patient is Default, Provider, MD  Patient coming from: home  Chief Complaint  Patient presents with  . Emesis      HPI:    Zachary Schneider  is a 47 y.o. male, with history of diabetes mellitus, insulin-dependent, came to hospital with chief complaints of nausea vomiting and diarrhea for the past three days. Patient says that the symptoms started on Friday when he developed nausea vomiting with diarrhea. Both vomiting and diarrhea  have resolved. He did not have any episodes since last night. But continues to have crampy abdominal pain and also felt dehydrated. He denies fever or chills. No chest pain or shortness of breath. He does complain of cough No dysuria urgency or frequency of urination. No history of syncope or dizziness.  In the ED, CT scan of the abdomen and pelvis showed partial SPO versus ileus Gen. surgery was consulted by the ED physician   Review of systems:     All other systems reviewed and are negative.   With Past History of the following :    Past Medical History:  Diagnosis Date  . Cancer ([place])   . Diabetes mellitus without complication ([place])   . Obese       Past Surgical History:  Procedure Laterality Date  . KNEE SURGERY    . TOE SURGERY Right       Social History:      Social History   Tobacco Use  . Smoking status: Current Every Day Smoker    Packs/day: 0.50    Types: Cigarettes  . Smokeless tobacco: Never Used  Substance Use Topics  . Alcohol use: No    Frequency: Never       Family History :   No family history of cancer or heart disease   Home Medications:   Prior to Admission medications   Medication Sig Start Date End Date Taking? Authorizing  Provider  ibuprofen (ADVIL,MOTRIN) 200 MG tablet Take 400 mg by mouth every 6 (six) hours as needed for fever or headache.   Yes [provider]  insulin NPH-regular Human (NOVOLIN 70/30) (70-30) 100 UNIT/ML injection Inject 50 Units into the skin 2 (two) times daily.     [provider]     Allergies:     Allergies  Allergen Reactions  . Bee Venom   . Mushroom Extract Complex      Physical Exam:   Vitals  Blood pressure 125/68, pulse 78, temperature 98.6 F (37 C), temperature source Oral, resp. rate 18, height 6\' 1"  (1.854 m), weight 132.7 kg (292 lb 8.8 oz), SpO2 94 %.  1.  General: appears in no acute distress  2. Psychiatric:  Intact judgement and  insight, awake alert, oriented x 3.  3. Neurologic: No focal neurological deficits, all cranial nerves intact.Strength 5/5 all 4 extremities,  sensation intact all 4 extremities, plantars down going.  4. Eyes :  anicteric sclerae, moist conjunctivae with no lid lag. PERRLA.  5. ENMT:  Oropharynx clear with moist mucous membranes and good dentition  6. Neck:  supple, no cervical lymphadenopathy appriciated, No thyromegaly  7. Respiratory : Normal respiratory effort, good air movement bilaterally,clear to  auscultation bilaterally  8. Cardiovascular : RRR, no gallops, rubs or murmurs, no leg edema  9. Gastrointestinal:  Positive bowel sounds, abdomen soft, non-tender to palpation,no hepatosplenomegaly, no rigidity or guarding       10. Skin:  No cyanosis, normal texture and turgor, no rash, lesions or ulcers  11.Musculoskeletal:  Good muscle tone,  joints appear normal , no effusions,  normal range of motion    Data Review:    CBC Recent Labs  Lab 07/14/17 1238  WBC 9.5  HGB 16.8  HCT 50.2  PLT 186  MCV 82.4  MCH 27.6  MCHC 33.5  RDW 14.9  LYMPHSABS 0.7  MONOABS 0.7  EOSABS 0.0  BASOSABS 0.0    ------------------------------------------------------------------------------------------------------------------  Chemistries  Recent Labs  Lab 07/14/17 1238  NA 129*  K 3.6  CL 88*  CO2 20*  GLUCOSE 397*  BUN 18  CREATININE 0.97  CALCIUM 8.3*  AST 35  ALT 48  ALKPHOS 74  BILITOT 1.9*   ------------------------------------------------------------------------------------------------------------------  ------------------------------------------------------------------------------------------------------------------ GFR: Estimated Creatinine Clearance: 135.9 mL/min (by C-G formula based on SCr of 0.97 mg/dL). Liver Function Tests: Recent Labs  Lab 07/14/17 1238  AST 35  ALT 48  ALKPHOS 74  BILITOT 1.9*  PROT 7.6  ALBUMIN 3.5   Recent Labs  Lab 07/14/17 1238  LIPASE 23    --------------------------------------------------------------------------------------------------------------- Urine analysis:    Component Value Date/Time   COLORURINE YELLOW 07/14/2017 1322   APPEARANCEUR CLEAR 07/14/2017 1322   LABSPEC 1.028 07/14/2017 1322   PHURINE 5.0 07/14/2017 1322   GLUCOSEU >=500 (A) 07/14/2017 1322   HGBUR NEGATIVE 07/14/2017 1322   BILIRUBINUR NEGATIVE 07/14/2017 1322   KETONESUR 80 (A) 07/14/2017 1322   PROTEINUR 30 (A) 07/14/2017 1322   NITRITE NEGATIVE 07/14/2017 1322   LEUKOCYTESUR NEGATIVE 07/14/2017 1322      Imaging Results:    Ct Abdomen Pelvis W Contrast  Result Date: 07/14/2017 CLINICAL DATA:  Nausea, vomiting, diarrhea, cough, chills and myalgias. Upper abdominal pain. EXAM: CT ABDOMEN AND PELVIS WITH CONTRAST TECHNIQUE: Multidetector CT imaging of the abdomen and pelvis was performed using the standard protocol following bolus administration of intravenous contrast. CONTRAST:  180mL ISOVUE-300 IOPAMIDOL (ISOVUE-300) INJECTION 61% COMPARISON:  07/19/2010. FINDINGS: Lower chest: Clear lung bases. Hepatobiliary: Faint, lobulated soft tissue  density in the dependent portion of the gallbladder. No gallbladder wall thickening or pericholecystic fluid. Normal appearing liver. Pancreas: Unremarkable. No pancreatic ductal dilatation or surrounding inflammatory changes. Spleen: Normal in size without focal abnormality. Adrenals/Urinary Tract: Adrenal glands are unremarkable. Kidneys are normal, without renal calculi, focal lesion, or hydronephrosis. Bladder is unremarkable. Stomach/Bowel: Mildly dilated stomach, proximal small bowel and mid small bowel. Normal caliber distal small bowel. Small number of colonic diverticula. Normal appearing appendix. Vascular/Lymphatic: Minimal atheromatous arterial calcification without aneurysm no enlarged lymph nodes. Reproductive: Prostate is unremarkable. Other: 3.5 cm lipoma posterolateral to the distal common femoral vein on the right. Tiny umbilical hernia containing fat. Musculoskeletal: Lumbar and lower thoracic spine degenerative changes. IMPRESSION: 1. Mild partial small bowel obstruction or ileus. 2. Probable sludge and/or noncalcified gallstones in the gallbladder without evidence of cholecystitis. A gallbladder mass is less likely. This  could be better delineated with a limited right upper quadrant abdomen ultrasound. 3. Mild colonic diverticulosis. Electronically Signed   By: Claudie Revering M.D.   On: 07/14/2017 17:48       Assessment & Plan:    Active Problems:   SBO (small bowel obstruction) ([place])   Ileus ([place])   1. Ileus versus partial SBO-likely from viral illness, symptoms have resolved. Patient does have positive bowel sounds and passing flatus. Will keep NPO at midnight. Continue IV fluids. Check KUB in a.m. If SBO has resolved. Consider starting him on clear liquid diet.  General surgery will see the patient in a.m. 2. Gallstones-CT scan shows noncalcified gallstones. Will obtain abdominal ultrasound in a.m. 3. Diabetes mellitus-keep NPO, check CBG Q6 hours. Hold insulin 70/30. Start  sliding scale insulin with NovoLog.   DVT Prophylaxis-   Lovenox   AM Labs Ordered, also please review Full Orders  Family Communication: Admission, patients condition and plan of care including tests being ordered have been discussed with the patient and his father on phone who indicate understanding and agree with the plan and Code Status.  Code Status: full code  Admission status: observation  Time spent in minutes : 60 minutes   Oswald Hillock M.D on 07/14/2017 at 8:39 PM  Between 7am to 7pm - Pager - [phone]. After 7pm go to [email or web address] - password [facility]  [facility] - Office  [phone]

## 2017-07-14 NOTE — ED Notes (Signed)
ED Provider at bedside. 

## 2017-07-14 NOTE — ED Provider Notes (Signed)
[facility] EMERGENCY DEPARTMENT Provider Note   CSN: [id number] Arrival date & time: 07/14/17  1151     History   Chief Complaint Chief Complaint  Patient presents with  . Emesis    HPI [name] is a 47 y.o. male.  HPI  Patient with a history of insulin dependent diabetes presents with 3 days of illness. Patient was well prior to the onset of illness. Patient states that since onset he has had persistent anorexia, nausea, multiple episodes of vomiting, and inability to tolerate oral intake of any kind. Discomfort of the abdomen is primarily in the epigastrium, nonradiating, sore, crampy, severe, worse after eating. There is associated fever, subjective, chills, diaphoresis. No chest pain, no dyspnea. No clear relief with anything currently patient has been unable to take his oral medication since onset of the illness. Patient does not drink, smoke cigarettes, but is trying to quit.   Past Medical History:  Diagnosis Date  . Cancer ([place])   . Diabetes mellitus without complication ([place])   . Obese     There are no active problems to display for this patient.   Past Surgical History:  Procedure Laterality Date  . KNEE SURGERY    . TOE SURGERY Right        Home Medications    Prior to Admission medications   Medication Sig Start Date End Date Taking? Authorizing Provider  ibuprofen (ADVIL,MOTRIN) 200 MG tablet Take 400 mg by mouth every 6 (six) hours as needed for fever or headache.   Yes [provider]  insulin NPH-regular Human (NOVOLIN 70/30) (70-30) 100 UNIT/ML injection Inject 50 Units into the skin 2 (two) times daily.     [provider]    Family History No family history on file.  Social History Social History   Tobacco Use  . Smoking status: Current Every Day Smoker    Packs/day: 0.50    Types: Cigarettes  . Smokeless tobacco: Never Used  Substance Use Topics  . Alcohol use: No    Frequency: Never  . Drug use: No       Allergies   Bee venom and Mushroom extract complex   Review of Systems Review of Systems  Constitutional:       Per HPI, otherwise negative  HENT:       Per HPI, otherwise negative  Respiratory:       Per HPI, otherwise negative  Cardiovascular:       Per HPI, otherwise negative  Gastrointestinal: Positive for abdominal pain, nausea and vomiting.  Endocrine:       Negative aside from HPI  Genitourinary:       Neg aside from HPI   Musculoskeletal:       Per HPI, otherwise negative  Skin: Negative.   Neurological: Positive for weakness. Negative for syncope.     Physical Exam Updated Vital Signs BP 123/72   Pulse 67   Temp 98.1 F (36.7 C) (Oral)   Resp 18   Ht 6\' 1"  (1.854 m)   Wt (!) 142.9 kg (315 lb)   SpO2 95%   BMI 41.56 kg/m   Physical Exam  Constitutional: He is oriented to person, place, and time. He appears well-developed. No distress.  Obese, well-appearing male speaking clearly, interacting appropriately  HENT:  Head: Normocephalic and atraumatic.  Eyes: Conjunctivae and EOM are normal.  Cardiovascular: Normal rate and regular rhythm.  Pulmonary/Chest: Effort normal. No stridor. No respiratory distress.  Abdominal: He exhibits no distension.  Musculoskeletal: He exhibits no edema.  Neurological: He is alert and oriented to person, place, and time.  Skin: Skin is warm and dry.  Psychiatric: He has a normal mood and affect.  Nursing note and vitals reviewed.    ED Treatments / Results  Labs (all labs ordered are listed, but only abnormal results are displayed) Labs Reviewed  COMPREHENSIVE METABOLIC PANEL - Abnormal; Notable for the following components:      Result Value   Sodium 129 (*)    Chloride 88 (*)    CO2 20 (*)    Glucose, Bld 397 (*)    Calcium 8.3 (*)    Total Bilirubin 1.9 (*)    All other components within normal limits  CBC WITH DIFFERENTIAL/PLATELET - Abnormal; Notable for the following components:   RBC 6.09 (*)     Neutro Abs 8.0 (*)    All other components within normal limits  URINALYSIS, ROUTINE W REFLEX MICROSCOPIC - Abnormal; Notable for the following components:   Glucose, UA >=500 (*)    Ketones, ur 80 (*)    Protein, ur 30 (*)    Squamous Epithelial / LPF 0-5 (*)    All other components within normal limits  BLOOD GAS, VENOUS - Abnormal; Notable for the following components:   pH, Ven 7.433 (*)    pCO2, Ven 26.2 (*)    pO2, Ven 94.5 (*)    Acid-base deficit 6.3 (*)    All other components within normal limits  ETHANOL  LIPASE, BLOOD    Radiology Ct Abdomen Pelvis W Contrast  Result Date: 07/14/2017 CLINICAL DATA:  Nausea, vomiting, diarrhea, cough, chills and myalgias. Upper abdominal pain. EXAM: CT ABDOMEN AND PELVIS WITH CONTRAST TECHNIQUE: Multidetector CT imaging of the abdomen and pelvis was performed using the standard protocol following bolus administration of intravenous contrast. CONTRAST:  151mL ISOVUE-300 IOPAMIDOL (ISOVUE-300) INJECTION 61% COMPARISON:  07/19/2010. FINDINGS: Lower chest: Clear lung bases. Hepatobiliary: Faint, lobulated soft tissue density in the dependent portion of the gallbladder. No gallbladder wall thickening or pericholecystic fluid. Normal appearing liver. Pancreas: Unremarkable. No pancreatic ductal dilatation or surrounding inflammatory changes. Spleen: Normal in size without focal abnormality. Adrenals/Urinary Tract: Adrenal glands are unremarkable. Kidneys are normal, without renal calculi, focal lesion, or hydronephrosis. Bladder is unremarkable. Stomach/Bowel: Mildly dilated stomach, proximal small bowel and mid small bowel. Normal caliber distal small bowel. Small number of colonic diverticula. Normal appearing appendix. Vascular/Lymphatic: Minimal atheromatous arterial calcification without aneurysm no enlarged lymph nodes. Reproductive: Prostate is unremarkable. Other: 3.5 cm lipoma posterolateral to the distal common femoral vein on the right. Tiny  umbilical hernia containing fat. Musculoskeletal: Lumbar and lower thoracic spine degenerative changes. IMPRESSION: 1. Mild partial small bowel obstruction or ileus. 2. Probable sludge and/or noncalcified gallstones in the gallbladder without evidence of cholecystitis. A gallbladder mass is less likely. This could be better delineated with a limited right upper quadrant abdomen ultrasound. 3. Mild colonic diverticulosis. Electronically Signed   By: Claudie Revering M.D.   On: 07/14/2017 17:48    Procedures Procedures (including critical care time)  Medications Ordered in ED Medications  lactated ringers bolus 500 mL (500 mLs Intravenous New Bag/Given 07/14/17 1657)  sodium chloride 0.9 % bolus 1,000 mL (0 mLs Intravenous Stopped 07/14/17 1359)  ondansetron (ZOFRAN) injection 4 mg (4 mg Intravenous Given 07/14/17 1236)  morphine 4 MG/ML injection 4 mg (4 mg Intravenous Given 07/14/17 1236)  sodium chloride 0.9 % bolus 1,000 mL (0 mLs Intravenous Stopped 07/14/17 1706)  famotidine (  PEPCID) IVPB 20 mg premix (0 mg Intravenous Stopped 07/14/17 1642)  gi cocktail (Maalox,Lidocaine,Donnatal) (30 mLs Oral Given 07/14/17 1658)  iopamidol (ISOVUE-300) 61 % injection 100 mL (100 mLs Intravenous Contrast Given 07/14/17 1712)     Initial Impression / Assessment and Plan / ED Course  I have reviewed the triage vital signs and the nursing notes.  Pertinent labs & imaging results that were available during my care of the patient were reviewed by me and considered in my medical decision making (see chart for details).     6:21 PM Patient in no distress, but continues to complain of pain in his upper abdomen. Labs notable for hyperglycemia, hypochloremia, hyponatremia, and substantial ketonuria.  6:21 PM On repeat exam the patient continues to complain of pain in the upper abdomen. CT results reviewed with the patient. No evidence for diverticulitis, but there is some evidence for ileus versus bowel obstruction.   On given the patient's persistent nausea, pain, p.o. intolerance beyond minor sips of water, the patient will be admitted. I discussed patient's case with our surgeon on call, and he will evaluate the patient tomorrow morning. Patient has received multiple liters of fluid, both normal saline and lactated Ringer's, has received antiemetics, GI cocktail, morphine, but will require continued fluid resuscitation given his hyperglycemia, ketonuria as well.  Final Clinical Impressions(s) / ED Diagnoses  Partial bowel obstruction Hyperglycemia Abdominal pain   Carmin Muskrat, MD 07/14/17 [place]

## 2017-07-14 NOTE — ED Triage Notes (Signed)
Patient c/o nausea, vomiting, diarrhea, cough, chills, and body aches.Patient states symptoms started on Friday. Unsure of any fevers.

## 2017-07-15 ENCOUNTER — Observation Stay (HOSPITAL_COMMUNITY): Payer: No Typology Code available for payment source

## 2017-07-15 DIAGNOSIS — K56600 Partial intestinal obstruction, unspecified as to cause: Principal | ICD-10-CM

## 2017-07-15 LAB — GLUCOSE, CAPILLARY
GLUCOSE-CAPILLARY: 248 mg/dL — AB (ref 65–99)
GLUCOSE-CAPILLARY: 294 mg/dL — AB (ref 65–99)
Glucose-Capillary: 272 mg/dL — ABNORMAL HIGH (ref 65–99)
Glucose-Capillary: 294 mg/dL — ABNORMAL HIGH (ref 65–99)

## 2017-07-15 LAB — CBC
HCT: 47.1 % (ref 39.0–52.0)
HEMOGLOBIN: 15 g/dL (ref 13.0–17.0)
MCH: 27.2 pg (ref 26.0–34.0)
MCHC: 31.8 g/dL (ref 30.0–36.0)
MCV: 85.3 fL (ref 78.0–100.0)
Platelets: 198 10*3/uL (ref 150–400)
RBC: 5.52 MIL/uL (ref 4.22–5.81)
RDW: 14.2 % (ref 11.5–15.5)
WBC: 7.7 10*3/uL (ref 4.0–10.5)

## 2017-07-15 LAB — COMPREHENSIVE METABOLIC PANEL
ALBUMIN: 2.9 g/dL — AB (ref 3.5–5.0)
ALK PHOS: 59 U/L (ref 38–126)
ALT: 34 U/L (ref 17–63)
ANION GAP: 14 (ref 5–15)
AST: 23 U/L (ref 15–41)
BUN: 12 mg/dL (ref 6–20)
CALCIUM: 7.9 mg/dL — AB (ref 8.9–10.3)
CO2: 20 mmol/L — AB (ref 22–32)
Chloride: 98 mmol/L — ABNORMAL LOW (ref 101–111)
Creatinine, Ser: 0.85 mg/dL (ref 0.61–1.24)
GFR calc Af Amer: >60 mL/min (ref >60)
GFR calc non Af Amer: >60 mL/min (ref >60)
GLUCOSE: 276 mg/dL — AB (ref 65–99)
Potassium: 3.8 mmol/L (ref 3.5–5.1)
SODIUM: 132 mmol/L — AB (ref 135–145)
Total Bilirubin: 1.3 mg/dL — ABNORMAL HIGH (ref 0.3–1.2)
Total Protein: 6.3 g/dL — ABNORMAL LOW (ref 6.5–8.1)

## 2017-07-15 LAB — HEMOGLOBIN A1C
HEMOGLOBIN A1C: 12.2 % — AB (ref 4.8–5.6)
Mean Plasma Glucose: 303.44 mg/dL

## 2017-07-15 NOTE — Discharge Summary (Signed)
Physician Discharge Summary  Zachary Schneider XAJ:[id number] DOB: 1970/11/16 DOA: 07/14/2017  PCP: Default, Provider, MD  Admit date: 07/14/2017 Discharge date: 07/15/2017  Time spent: 45 minutes  Recommendations for Outpatient Follow-up:  -Will be discharged home today. -Advised to follow up with PCP in 2 weeks.   Discharge Diagnoses:  Active Problems:   SBO (small bowel obstruction) ([place])   Ileus ([place])   Discharge Condition: Stable and improved  Filed Weights   07/14/17 1158 07/14/17 2020  Weight: (!) 142.9 kg (315 lb) 132.7 kg (292 lb 8.8 oz)    History of present illness:  As per Dr. Darrick Meigs on 1/13: Zachary Schneider  is a 47 y.o. male, with history of diabetes mellitus, insulin-dependent, came to hospital with chief complaints of nausea vomiting and diarrhea for the past three days. Patient says that the symptoms started on Friday when he developed nausea vomiting with diarrhea. Both vomiting and diarrhea  have resolved. He did not have any episodes since last night. But continues to have crampy abdominal pain and also felt dehydrated. He denies fever or chills. No chest pain or shortness of breath. He does complain of cough No dysuria urgency or frequency of urination. No history of syncope or dizziness.  In the ED, CT scan of the abdomen and pelvis showed partial SPO versus ileus Gen. surgery was consulted by the ED physician    Hospital Course:   Abdominal pain/emesis -Resolved, tolerating solid diet. -Initial CT scan with SBO versus ileus. -Plain abdominal film today shows nondistended gas filled bowel loops most consistent with enteritis. -Do not feel that he needs antibiotics at this time. -Discussed with general surgery, he agrees.  Diabetes -Continue 7030 regimen.  Procedures:  None  Consultations:  None  Discharge Instructions  Discharge Instructions    Diet - low sodium heart healthy   Complete by:  As directed    Increase activity slowly    Complete by:  As directed      Allergies as of 07/15/2017      Reactions   Bee Venom    Mushroom Extract Complex       Medication List    TAKE these medications   ibuprofen 200 MG tablet Commonly known as:  ADVIL,MOTRIN Take 400 mg by mouth every 6 (six) hours as needed for fever or headache.   insulin NPH-regular Human (70-30) 100 UNIT/ML injection Commonly known as:  NOVOLIN 70/30 Inject 50 Units into the skin 2 (two) times daily.      Allergies  Allergen Reactions  . Bee Venom   . Mushroom Extract Complex       The results of significant diagnostics from this hospitalization (including imaging, microbiology, ancillary and laboratory) are listed below for reference.    Significant Diagnostic Studies: Abd 1 View (kub)  Result Date: 07/15/2017 CLINICAL DATA:  Abdominal pain.  Small-bowel obstruction. EXAM: ABDOMEN - 1 VIEW COMPARISON:  CT abdomen and pelvis 07/14/2016. FINDINGS: Scattered gas-filled but nondistended loops of small bowel are identified. There is gas and stool in the colon. IMPRESSION: Gas-filled but nondilated loops of small bowel are most suggestive enteritis rather than small bowel obstruction or ileus. Electronically Signed   By: Inge Rise M.D.   On: 07/15/2017 09:54   US Abdomen Complete  Result Date: 07/15/2017 CLINICAL DATA:  Recent episodes of nausea and vomiting and upper abdominal pain associated with chills. History of obesity, diabetes. Gallstones and sludge were seen on CT scan yesterday. EXAM: ABDOMEN ULTRASOUND COMPLETE COMPARISON:  Abdominal and pelvic CT scan of July 14, 2017 FINDINGS: Gallbladder: The gallbladder is only partially distended. A large stone measuring up to 1.4 cm in diameter is observed. There may be sludge present. There is no gallbladder wall thickening, pericholecystic fluid, or positive sonographic Murphy's sign. Common bile duct: Diameter: 5.5 mm Liver: The hepatic echotexture is subjectively increased. The surface  contour is smooth where visualized. No focal mass or ductal dilation is observed. Portal vein is patent on color Doppler imaging with normal direction of blood flow towards the liver. IVC: No abnormality visualized. Pancreas: Bowel gas limits evaluation of the pancreas. Spleen: Size and appearance within normal limits. Right Kidney: Length: 13.4 cm. Echogenicity within normal limits. No mass or hydronephrosis visualized. Left Kidney: Length: 13.9 cm. Echogenicity within normal limits. No mass or hydronephrosis visualized. Abdominal aorta: Bowel gas limits evaluation of the abdominal aorta. Other findings: There is no ascites. IMPRESSION: The study is somewhat limited due to the patient's body habitus and excessive bowel gas as well as the patient's inability to tolerate palpation with the ultrasound probe. Gallstones without sonographic evidence of acute cholecystitis. Probable fatty infiltrative change of the liver. Electronically Signed   By: David  Martinique M.D.   On: 07/15/2017 09:51   Ct Abdomen Pelvis W Contrast  Result Date: 07/14/2017 CLINICAL DATA:  Nausea, vomiting, diarrhea, cough, chills and myalgias. Upper abdominal pain. EXAM: CT ABDOMEN AND PELVIS WITH CONTRAST TECHNIQUE: Multidetector CT imaging of the abdomen and pelvis was performed using the standard protocol following bolus administration of intravenous contrast. CONTRAST:  190mL ISOVUE-300 IOPAMIDOL (ISOVUE-300) INJECTION 61% COMPARISON:  07/19/2010. FINDINGS: Lower chest: Clear lung bases. Hepatobiliary: Faint, lobulated soft tissue density in the dependent portion of the gallbladder. No gallbladder wall thickening or pericholecystic fluid. Normal appearing liver. Pancreas: Unremarkable. No pancreatic ductal dilatation or surrounding inflammatory changes. Spleen: Normal in size without focal abnormality. Adrenals/Urinary Tract: Adrenal glands are unremarkable. Kidneys are normal, without renal calculi, focal lesion, or hydronephrosis. Bladder  is unremarkable. Stomach/Bowel: Mildly dilated stomach, proximal small bowel and mid small bowel. Normal caliber distal small bowel. Small number of colonic diverticula. Normal appearing appendix. Vascular/Lymphatic: Minimal atheromatous arterial calcification without aneurysm no enlarged lymph nodes. Reproductive: Prostate is unremarkable. Other: 3.5 cm lipoma posterolateral to the distal common femoral vein on the right. Tiny umbilical hernia containing fat. Musculoskeletal: Lumbar and lower thoracic spine degenerative changes. IMPRESSION: 1. Mild partial small bowel obstruction or ileus. 2. Probable sludge and/or noncalcified gallstones in the gallbladder without evidence of cholecystitis. A gallbladder mass is less likely. This could be better delineated with a limited right upper quadrant abdomen ultrasound. 3. Mild colonic diverticulosis. Electronically Signed   By: Claudie Revering M.D.   On: 07/14/2017 17:48    Microbiology: No results found for this or any previous visit (from the past 240 hour(s)).   Labs: Basic Metabolic Panel: Recent Labs  Lab 07/14/17 1238 07/15/17 0555  NA 129* 132*  K 3.6 3.8  CL 88* 98*  CO2 20* 20*  GLUCOSE 397* 276*  BUN 18 12  CREATININE 0.97 0.85  CALCIUM 8.3* 7.9*   Liver Function Tests: Recent Labs  Lab 07/14/17 1238 07/15/17 0555  AST 35 23  ALT 48 34  ALKPHOS 74 59  BILITOT 1.9* 1.3*  PROT 7.6 6.3*  ALBUMIN 3.5 2.9*   Recent Labs  Lab 07/14/17 1238  LIPASE 23   No results for input(s): AMMONIA in the last 168 hours. CBC: Recent Labs  Lab 07/14/17 1238 07/15/17 0555  WBC 9.5 7.7  NEUTROABS 8.0*  --   HGB 16.8 15.0  HCT 50.2 47.1  MCV 82.4 85.3  PLT 186 198   Cardiac Enzymes: No results for input(s): CKTOTAL, CKMB, CKMBINDEX, TROPONINI in the last 168 hours. BNP: BNP (last 3 results) No results for input(s): BNP in the last 8760 hours.  ProBNP (last 3 results) No results for input(s): PROBNP in the last 8760  hours.  CBG: Recent Labs  Lab 07/15/17 0016 07/15/17 0543 07/15/17 1113  GLUCAP 272* 248* 294*       Signed:  Lelon Frohlich  [facility] Pager: [phone] 07/15/2017, 4:01 PM

## 2017-07-15 NOTE — Progress Notes (Signed)
Patient was able to eat solid food, which consisted of chicken tenders, chips and some yogurt.  Waited 90 minutes.  Patient has no complaints of abdominal pain, nausea, vomiting, or diarrhea.  Patient is therefore discharged home with his mom and dad.

## 2017-07-15 NOTE — Progress Notes (Signed)
Inpatient Diabetes Program Recommendations  AACE/ADA: New Consensus Statement on Inpatient Glycemic Control (2015)  Target Ranges:  Prepandial:   less than 140 mg/dL      Peak postprandial:   less than 180 mg/dL (1-2 hours)      Critically ill patients:  140 - 180 mg/dL  Results for THOREN, HOSANG (MRN [id number]) as of 07/15/2017 09:33  Ref. Range 07/15/2017 00:16 07/15/2017 05:43  Glucose-Capillary Latest Ref Range: 65 - 99 mg/dL 272 (H) 248 (H)   Results for JUNIPER, SNYDERS (MRN [id number]) as of 07/15/2017 09:33  Ref. Range 05/01/2011 23:40  Hemoglobin A1C Latest Ref Range: <5.7 % 8.5 (H)   Review of Glycemic Control  Diabetes history: DM2 Outpatient Diabetes medications: 70/30 50 units BID Current orders for Inpatient glycemic control: Novolog 0-9 units TID with meals  Inpatient Diabetes Program Recommendations: Insulin - Basal: Please consider ordering Lantus 13 units Q24H (based on 132 kg x 0.1 units). Correction (SSI): If patient will remain NPO, please consider changing frequency of CBGs and Novolog correction to Q4H. HgbA1C: A1C in process.  Thanks, Barnie Alderman, RN, MSN, CDE Diabetes Coordinator Inpatient Diabetes Program [phone] (Team Pager from 8am to 5pm)

## 2017-07-16 LAB — HIV ANTIBODY (ROUTINE TESTING W REFLEX): HIV Screen 4th Generation wRfx: NONREACTIVE

## 2017-12-16 ENCOUNTER — Emergency Department (HOSPITAL_BASED_OUTPATIENT_CLINIC_OR_DEPARTMENT_OTHER)
Admission: EM | Admit: 2017-12-16 | Discharge: 2017-12-16 | Disposition: A | Discharge status: Home / Self Care | Payer: 59 | Source: Home / Self Care

## 2017-12-16 ENCOUNTER — Emergency Department (HOSPITAL_COMMUNITY): Payer: 59

## 2017-12-16 ENCOUNTER — Other Ambulatory Visit: Payer: Self-pay

## 2017-12-16 ENCOUNTER — Encounter (HOSPITAL_BASED_OUTPATIENT_CLINIC_OR_DEPARTMENT_OTHER): Payer: Self-pay | Admitting: *Deleted

## 2017-12-16 ENCOUNTER — Inpatient Hospital Stay (HOSPITAL_COMMUNITY)
Admission: EM | Admit: 2017-12-16 | Discharge: 2017-12-20 | DRG: 854 | Disposition: A | Discharge status: Home / Self Care | Payer: 59 | Attending: Internal Medicine | Admitting: Internal Medicine

## 2017-12-16 ENCOUNTER — Encounter (HOSPITAL_COMMUNITY): Payer: Self-pay | Admitting: Emergency Medicine

## 2017-12-16 DIAGNOSIS — L03115 Cellulitis of right lower limb: Secondary | ICD-10-CM | POA: Diagnosis not present

## 2017-12-16 DIAGNOSIS — E1165 Type 2 diabetes mellitus with hyperglycemia: Secondary | ICD-10-CM

## 2017-12-16 DIAGNOSIS — Z91018 Allergy to other foods: Secondary | ICD-10-CM

## 2017-12-16 DIAGNOSIS — Z794 Long term (current) use of insulin: Secondary | ICD-10-CM

## 2017-12-16 DIAGNOSIS — F17210 Nicotine dependence, cigarettes, uncomplicated: Secondary | ICD-10-CM | POA: Diagnosis present

## 2017-12-16 DIAGNOSIS — L97518 Non-pressure chronic ulcer of other part of right foot with other specified severity: Secondary | ICD-10-CM

## 2017-12-16 DIAGNOSIS — Z716 Tobacco abuse counseling: Secondary | ICD-10-CM

## 2017-12-16 DIAGNOSIS — L089 Local infection of the skin and subcutaneous tissue, unspecified: Secondary | ICD-10-CM

## 2017-12-16 DIAGNOSIS — Z720 Tobacco use: Secondary | ICD-10-CM | POA: Diagnosis present

## 2017-12-16 DIAGNOSIS — Z5321 Procedure and treatment not carried out due to patient leaving prior to being seen by health care provider: Secondary | ICD-10-CM | POA: Insufficient documentation

## 2017-12-16 DIAGNOSIS — Z833 Family history of diabetes mellitus: Secondary | ICD-10-CM | POA: Diagnosis not present

## 2017-12-16 DIAGNOSIS — Z91030 Bee allergy status: Secondary | ICD-10-CM

## 2017-12-16 DIAGNOSIS — E11621 Type 2 diabetes mellitus with foot ulcer: Secondary | ICD-10-CM | POA: Diagnosis not present

## 2017-12-16 DIAGNOSIS — E119 Type 2 diabetes mellitus without complications: Secondary | ICD-10-CM

## 2017-12-16 DIAGNOSIS — E876 Hypokalemia: Secondary | ICD-10-CM | POA: Diagnosis present

## 2017-12-16 DIAGNOSIS — E871 Hypo-osmolality and hyponatremia: Secondary | ICD-10-CM | POA: Diagnosis not present

## 2017-12-16 DIAGNOSIS — Z8547 Personal history of malignant neoplasm of testis: Secondary | ICD-10-CM

## 2017-12-16 DIAGNOSIS — L97509 Non-pressure chronic ulcer of other part of unspecified foot with unspecified severity: Secondary | ICD-10-CM

## 2017-12-16 DIAGNOSIS — A419 Sepsis, unspecified organism: Secondary | ICD-10-CM

## 2017-12-16 DIAGNOSIS — Z6839 Body mass index (BMI) 39.0-39.9, adult: Secondary | ICD-10-CM | POA: Diagnosis not present

## 2017-12-16 DIAGNOSIS — R739 Hyperglycemia, unspecified: Secondary | ICD-10-CM

## 2017-12-16 DIAGNOSIS — T148XXA Other injury of unspecified body region, initial encounter: Secondary | ICD-10-CM

## 2017-12-16 DIAGNOSIS — L97519 Non-pressure chronic ulcer of other part of right foot with unspecified severity: Secondary | ICD-10-CM | POA: Diagnosis present

## 2017-12-16 DIAGNOSIS — L84 Corns and callosities: Secondary | ICD-10-CM | POA: Diagnosis present

## 2017-12-16 DIAGNOSIS — E1143 Type 2 diabetes mellitus with diabetic autonomic (poly)neuropathy: Secondary | ICD-10-CM | POA: Diagnosis not present

## 2017-12-16 DIAGNOSIS — E11628 Type 2 diabetes mellitus with other skin complications: Secondary | ICD-10-CM | POA: Diagnosis present

## 2017-12-16 DIAGNOSIS — L97419 Non-pressure chronic ulcer of right heel and midfoot with unspecified severity: Secondary | ICD-10-CM | POA: Insufficient documentation

## 2017-12-16 HISTORY — DX: Tobacco use: Z72.0

## 2017-12-16 LAB — COMPREHENSIVE METABOLIC PANEL
ALBUMIN: 3.6 g/dL (ref 3.5–5.0)
ALK PHOS: 93 U/L (ref 38–126)
ALT: 19 U/L (ref 17–63)
ANION GAP: 14 (ref 5–15)
AST: 11 U/L — AB (ref 15–41)
BILIRUBIN TOTAL: 1.4 mg/dL — AB (ref 0.3–1.2)
BUN: 10 mg/dL (ref 6–20)
CALCIUM: 8.7 mg/dL — AB (ref 8.9–10.3)
CO2: 22 mmol/L (ref 22–32)
CREATININE: 0.61 mg/dL (ref 0.61–1.24)
Chloride: 95 mmol/L — ABNORMAL LOW (ref 101–111)
GFR calc Af Amer: >60 mL/min (ref >60)
GFR calc non Af Amer: >60 mL/min (ref >60)
Glucose, Bld: 328 mg/dL — ABNORMAL HIGH (ref 65–99)
Potassium: 3.6 mmol/L (ref 3.5–5.1)
Sodium: 131 mmol/L — ABNORMAL LOW (ref 135–145)
TOTAL PROTEIN: 7.7 g/dL (ref 6.5–8.1)

## 2017-12-16 LAB — CBC WITH DIFFERENTIAL/PLATELET
BASOS ABS: 0 10*3/uL (ref 0.0–0.1)
BASOS PCT: 0 %
EOS ABS: 0.1 10*3/uL (ref 0.0–0.7)
EOS PCT: 0 %
HCT: 47.4 % (ref 39.0–52.0)
Hemoglobin: 15.9 g/dL (ref 13.0–17.0)
Lymphocytes Relative: 13 %
Lymphs Abs: 2 10*3/uL (ref 0.7–4.0)
MCH: 27.6 pg (ref 26.0–34.0)
MCHC: 33.5 g/dL (ref 30.0–36.0)
MCV: 82.3 fL (ref 78.0–100.0)
MONOS PCT: 8 %
Monocytes Absolute: 1.3 10*3/uL — ABNORMAL HIGH (ref 0.1–1.0)
Neutro Abs: 12.2 10*3/uL — ABNORMAL HIGH (ref 1.7–7.7)
Neutrophils Relative %: 79 %
PLATELETS: 220 10*3/uL (ref 150–400)
RBC: 5.76 MIL/uL (ref 4.22–5.81)
RDW: 15 % (ref 11.5–15.5)
WBC: 15.6 10*3/uL — ABNORMAL HIGH (ref 4.0–10.5)

## 2017-12-16 LAB — URINALYSIS, ROUTINE W REFLEX MICROSCOPIC
Bacteria, UA: NONE SEEN
Bilirubin Urine: NEGATIVE
Hgb urine dipstick: NEGATIVE
KETONES UR: 80 mg/dL — AB
LEUKOCYTES UA: NEGATIVE
Nitrite: NEGATIVE
PH: 6 (ref 5.0–8.0)
Protein, ur: NEGATIVE mg/dL
SPECIFIC GRAVITY, URINE: 1.036 — AB (ref 1.005–1.030)

## 2017-12-16 LAB — SEDIMENTATION RATE: Sed Rate: 24 mm/hr — ABNORMAL HIGH (ref 0–16)

## 2017-12-16 LAB — MAGNESIUM: Magnesium: 2 mg/dL (ref 1.7–2.4)

## 2017-12-16 LAB — CBG MONITORING, ED: GLUCOSE-CAPILLARY: 321 mg/dL — AB (ref 65–99)

## 2017-12-16 LAB — PROTIME-INR
INR: 0.96
Prothrombin Time: 12.7 seconds (ref 11.4–15.2)

## 2017-12-16 LAB — LACTIC ACID, PLASMA: Lactic Acid, Venous: 1.2 mmol/L (ref 0.5–1.9)

## 2017-12-16 LAB — PHOSPHORUS: PHOSPHORUS: 3.6 mg/dL (ref 2.5–4.6)

## 2017-12-16 MED ORDER — SODIUM CHLORIDE 0.9 % IV BOLUS
1000.0000 mL | Freq: Once | INTRAVENOUS | Status: AC
  Administered 2017-12-16: 1000 mL via INTRAVENOUS

## 2017-12-16 MED ORDER — SODIUM CHLORIDE 0.9 % IV SOLN
2.0000 g | Freq: Three times a day (TID) | INTRAVENOUS | Status: DC
  Administered 2017-12-16 – 2017-12-17 (×2): 2 g via INTRAVENOUS
  Filled 2017-12-16 (×5): qty 2

## 2017-12-16 MED ORDER — INSULIN ASPART 100 UNIT/ML ~~LOC~~ SOLN
0.0000 [IU] | Freq: Three times a day (TID) | SUBCUTANEOUS | Status: DC
  Administered 2017-12-17: 15 [IU] via SUBCUTANEOUS
  Administered 2017-12-17: 11 [IU] via SUBCUTANEOUS
  Administered 2017-12-17: 15 [IU] via SUBCUTANEOUS
  Administered 2017-12-18: 7 [IU] via SUBCUTANEOUS
  Administered 2017-12-18: 11 [IU] via SUBCUTANEOUS
  Administered 2017-12-19: 4 [IU] via SUBCUTANEOUS
  Administered 2017-12-19 – 2017-12-20 (×3): 7 [IU] via SUBCUTANEOUS
  Filled 2017-12-16: qty 1

## 2017-12-16 MED ORDER — SODIUM CHLORIDE 0.9 % IV SOLN
INTRAVENOUS | Status: AC
  Administered 2017-12-16: 23:00:00 via INTRAVENOUS

## 2017-12-16 MED ORDER — VANCOMYCIN HCL 10 G IV SOLR
INTRAVENOUS | Status: AC
  Filled 2017-12-16: qty 1500

## 2017-12-16 MED ORDER — VANCOMYCIN HCL IN DEXTROSE 1-5 GM/200ML-% IV SOLN
1000.0000 mg | Freq: Once | INTRAVENOUS | Status: AC
  Administered 2017-12-16: 1000 mg via INTRAVENOUS
  Filled 2017-12-16: qty 200

## 2017-12-16 MED ORDER — KETOROLAC TROMETHAMINE 30 MG/ML IJ SOLN: 30.0000 mg | Freq: Four times a day (QID) | INTRAMUSCULAR | Status: AC | PRN

## 2017-12-16 MED ORDER — METRONIDAZOLE IN NACL 5-0.79 MG/ML-% IV SOLN
500.0000 mg | Freq: Three times a day (TID) | INTRAVENOUS | Status: DC
  Administered 2017-12-16 – 2017-12-17 (×2): 500 mg via INTRAVENOUS
  Filled 2017-12-16 (×2): qty 100

## 2017-12-16 MED ORDER — INSULIN ASPART PROT & ASPART (70-30 MIX) 100 UNIT/ML ~~LOC~~ SUSP
50.0000 [IU] | Freq: Two times a day (BID) | SUBCUTANEOUS | Status: DC
  Administered 2017-12-17 – 2017-12-18 (×3): 50 [IU] via SUBCUTANEOUS
  Filled 2017-12-16 (×2): qty 10

## 2017-12-16 MED ORDER — VANCOMYCIN HCL 10 G IV SOLR
1500.0000 mg | Freq: Three times a day (TID) | INTRAVENOUS | Status: AC
  Administered 2017-12-17 – 2017-12-19 (×9): 1500 mg via INTRAVENOUS
  Filled 2017-12-16 (×12): qty 1500

## 2017-12-16 NOTE — ED Triage Notes (Signed)
Wound to his right foot that has been treated for years. He is diabetic. This am the ulcer opened and has an odor. Foot is red and hot.

## 2017-12-16 NOTE — ED Notes (Signed)
Report given to 300 RN. 

## 2017-12-16 NOTE — ED Notes (Signed)
Pt is mad because we have a 2 hour wait.

## 2017-12-16 NOTE — ED Notes (Signed)
Pt very upset about wait time.  States he is going to call 911 to come back and be seen sooner.  Would not let lab obtain blood as they did not get him on the first stick.  Advised pt we needed labwork and were moving as fast as possible to get him back.

## 2017-12-16 NOTE — ED Notes (Signed)
Pt not in waiting area 

## 2017-12-16 NOTE — Progress Notes (Signed)
Pharmacy Antibiotic Note  Zachary Schneider is a 47 y.o. male admitted on 12/16/2017 with Diabetic Foot Infection.  Pharmacy has been consulted for Vancomycin and Cefepime  dosing.  Plan: Vancomycin 2gm Load Vancomycin 1.5gm IV every 8 hours.  Goal trough 15-20 mcg/mL.  Cefepime 2gm IV every 8 hours. Monitor labs, micro and vitals.   Height: 6' (182.9 cm) Weight: 289 lb (131.1 kg) IBW/kg (Calculated) : 77.6  Temp (24hrs), Avg:98.6 F (37 C), Min:98.1 F (36.7 C), Max:98.9 F (37.2 C)  Recent Labs  Lab 12/16/17 1827 12/16/17 1833  WBC 15.6*  --   CREATININE 0.61  --   LATICACIDVEN  --  1.2    Estimated Creatinine Clearance: 159.8 mL/min (by C-G formula based on SCr of 0.61 mg/dL).    Allergies  Allergen Reactions  . Bee Venom Swelling  . Mushroom Extract Complex Nausea And Vomiting    Antimicrobials this admission: Vanc 6/17 >>  Cefepime 6/17 >>  Flagyl 6/17 >>  Dose adjustments this admission: Obesity/Normalized CrCl Vancomycin dosing protocol will be initiated with an estimated normalized CrCl = 152 ml/min.  Microbiology results: 6/17 BCx: pending  UCx:    Sputum:    MRSA PCR:   Thank you for allowing pharmacy to be a part of this patient's care.  Pricilla Larsson 12/16/2017 8:42 PM

## 2017-12-16 NOTE — ED Provider Notes (Signed)
[facility] EMERGENCY DEPARTMENT Provider Note   CSN: [id number] Arrival date & time: 12/16/17  1456     History   Chief Complaint Chief Complaint  Patient presents with  . Wound Infection    HPI Zachary Schneider is a 47 y.o. male with a history of insulin dependent diabetes,with no pcp but is followed by Dr. Lenn Sink of podiatry in [place], presenting with an acute on chronic ulceration to his right foot.  He reports he has had chronic ulceration of this right great toe with occasional need of debridement, has noticed it becoming a little more sore than normal and became quickly worsened today, opening up this morning with development of purulent drainage and now has had redness spreading up his leg.  He denies fevers, chills, n/v or other complaints.  His blood glucose levels have never been well controlled per his report, stating they hover around 200 range, was 209 this am.   The history is provided by the patient.    Past Medical History:  Diagnosis Date  . Cancer ([place])    testicular  . Diabetes mellitus without complication ([place])   . Obese     Patient Active Problem List   Diagnosis Date Noted  . Diabetic foot ulcer ([place]) 12/16/2017  . SBO (small bowel obstruction) ([place]) 07/14/2017  . Ileus ([place]) 07/14/2017    Past Surgical History:  Procedure Laterality Date  . KNEE SURGERY    . TOE SURGERY Right         Home Medications    Prior to Admission medications   Medication Sig Start Date End Date Taking? Authorizing Provider  ibuprofen (ADVIL,MOTRIN) 200 MG tablet Take 400 mg by mouth every 6 (six) hours as needed for fever or headache.    [provider]  insulin NPH-regular Human (NOVOLIN 70/30) (70-30) 100 UNIT/ML injection Inject 50 Units into the skin 2 (two) times daily.     [provider]    Family History History reviewed. No pertinent family history.  Social History Social History   Tobacco Use  . Smoking status: Current Every Day  Smoker    Packs/day: 0.50    Types: Cigarettes  . Smokeless tobacco: Never Used  Substance Use Topics  . Alcohol use: No    Frequency: Never  . Drug use: No     Allergies   Bee venom and Mushroom extract complex   Review of Systems Review of Systems  Constitutional: Negative for chills and fever.  HENT: Negative.   Eyes: Negative.   Respiratory: Negative for chest tightness and shortness of breath.   Cardiovascular: Negative for chest pain.  Gastrointestinal: Negative for abdominal pain, nausea and vomiting.  Genitourinary: Negative.   Musculoskeletal: Negative for arthralgias, joint swelling and neck pain.  Skin: Positive for color change and wound. Negative for rash.  Neurological: Negative for weakness and numbness.  Psychiatric/Behavioral: Negative.      Physical Exam Updated Vital Signs BP 130/87   Pulse 100   Temp 98.1 F (36.7 C) (Oral)   Resp 17   Ht 6' (1.829 m)   Wt 131.1 kg (289 lb)   SpO2 93%   BMI 39.20 kg/m   Physical Exam  Constitutional: He appears well-developed and well-nourished. No distress.  HENT:  Head: Normocephalic.  Eyes: Conjunctivae are normal.  Neck: Neck supple.  Cardiovascular: Normal rate.  Pulmonary/Chest: Effort normal. No respiratory distress.  Abdominal: Soft. He exhibits no distension. There is no tenderness.  Musculoskeletal: Normal range of motion.  He exhibits edema.  Skin: Lesion noted. No rash noted. There is erythema.  Right great toe infection with thick callous right plantar toe radiating to the 1st metatarsal area several areas of purulent seepage around the callous which is green/gray in coloration. Edema of great toe and dorsal foot, erythema with radiation of lacy erythema to popliteal area.       ED Treatments / Results  Labs (all labs ordered are listed, but only abnormal results are displayed) Labs Reviewed  COMPREHENSIVE METABOLIC PANEL - Abnormal; Notable for the following components:      Result Value    Sodium 131 (*)    Chloride 95 (*)    Glucose, Bld 328 (*)    Calcium 8.7 (*)    AST 11 (*)    Total Bilirubin 1.4 (*)    All other components within normal limits  CBC WITH DIFFERENTIAL/PLATELET - Abnormal; Notable for the following components:   WBC 15.6 (*)    Neutro Abs 12.2 (*)    Monocytes Absolute 1.3 (*)    All other components within normal limits  URINALYSIS, ROUTINE W REFLEX MICROSCOPIC - Abnormal; Notable for the following components:   Specific Gravity, Urine 1.036 (*)    Glucose, UA >=500 (*)    Ketones, ur 80 (*)    All other components within normal limits  CULTURE, BLOOD (ROUTINE X 2)  CULTURE, BLOOD (ROUTINE X 2)  MRSA PCR SCREENING  PROTIME-INR  LACTIC ACID, PLASMA  MAGNESIUM  PHOSPHORUS  HEMOGLOBIN J5K  BASIC METABOLIC PANEL  CBC WITH DIFFERENTIAL/PLATELET  SEDIMENTATION RATE  C-REACTIVE PROTEIN  PREALBUMIN    EKG None  Radiology Dg Chest 2 View  Result Date: 12/16/2017 CLINICAL DATA:  Right great toe ulcer. EXAM: CHEST - 2 VIEW COMPARISON:  Chest x-ray dated March 18, 2017. FINDINGS: The heart size and mediastinal contours are within normal limits. Both lungs are clear. The visualized skeletal structures are unremarkable. IMPRESSION: No active cardiopulmonary disease. Electronically Signed   By: Titus Dubin M.D.   On: 12/16/2017 15:41   Dg Foot Complete Right  Result Date: 12/16/2017 CLINICAL DATA:  Right great toe ulcer. EXAM: RIGHT FOOT COMPLETE - 3+ VIEW COMPARISON:  Right foot x-ray report dated February 21, 2004. FINDINGS: No acute fracture or dislocation. Well corticated, chronic appearing irregularity of the first proximal phalanx head and base of the first distal phalanx. Mild degenerative changes of the first MTP joint. Joint spaces are preserved. Bone mineralization is normal. Mild forefoot soft tissue swelling. IMPRESSION: 1. Well corticated, chronic appearing bony irregularity around the first IP joint, which could be related to old  trauma or prior infection. No definite acute bony destructive changes to suggest osteomyelitis. Consider further evaluation with MRI. Electronically Signed   By: Titus Dubin M.D.   On: 12/16/2017 15:50    Procedures Procedures (including critical care time)  Medications Ordered in ED Medications  vancomycin (VANCOCIN) IVPB 1000 mg/200 mL premix (1,000 mg Intravenous New Bag/Given 12/16/17 2004)  sodium chloride 0.9 % bolus 1,000 mL (1,000 mLs Intravenous New Bag/Given 12/16/17 2004)  ketorolac (TORADOL) 30 MG/ML injection 30 mg (has no administration in time range)  insulin aspart (novoLOG) injection 0-20 Units (has no administration in time range)  0.9 %  sodium chloride infusion (has no administration in time range)  metroNIDAZOLE (FLAGYL) IVPB 500 mg (has no administration in time range)     Initial Impression / Assessment and Plan / ED Course  I have reviewed the triage vital signs  and the nursing notes.  Pertinent labs & imaging results that were available during my care of the patient were reviewed by me and considered in my medical decision making (see chart for details).     Pt with diabetic wound infection with expanding cellulitis. Uncontrolled DM.  Discussed with Dr. Olevia Bowens who agrees with admission.  Final Clinical Impressions(s) / ED Diagnoses   Final diagnoses:  Wound infection  Cellulitis of right lower extremity  Hyperglycemia    ED Discharge Orders    None       Landis Martins 12/16/17 2014    Margette Fast, MD 12/17/17 1045

## 2017-12-16 NOTE — H&P (Signed)
History and Physical    Zachary Schneider XLK:[id number] DOB: 1971-03-14 DOA: 12/16/2017  PCP: Default, Provider, MD   Podiatrist: Dr. Lenn Sink in [place].  Patient coming from: Home.  I have personally briefly reviewed patient's old medical records in [place]  Chief Complaint: Right foot wound.  HPI: Zachary Schneider is a 47 y.o. male with medical history significant of testicular cancer, type 2 diabetes, morbid obesity, tobacco use who is coming to the emergency department due to his chronic right foot callus becoming ulcerated, tender with a malodorous discharge.  Per patient, he has required debridement in the past of his calluses, but he has never been this severe.  Over the weekend, he noticed that this was bothering a little bit more than usual, but he was able to get up and put up some shoes this morning.  He made an appointment to see podiatry tomorrow.  However around noontime today, he noticed he developed intense pain in the area.  He subsequently took his shoe and noticed that there was a lot more erythema, edema and foul-smelling discharge than earlier.  So he decided not to wait until he sees podiatry tomorrow and came to the emergency department.  He mentions that his blood glucoses have been in the 200 range, but over the last few days they have been higher than usual.  He denies fever, chills, sore throat, dyspnea, chest pain, palpitations, dizziness, orthopnea, recent pitting edema lower extremities, abdominal pain, nausea, emesis, diarrhea, melena, constipation, hematochezia, dysuria, frequency, hematuria or oliguria.  Denies polyuria, polydipsia or polyphagia.  No heat or cold intolerance.  ED Course: Initial vital signs temperature 98.8 F, pulse 114, respirations 18, blood pressure 147/100 mmHg and O2 sat 100% on room air.  The patient received a 1 L normal saline bolus.  Blood cultures were drawn.  He received vancomycin IVPB.  His lab work shows a white count of 15.6 with  79% neutrophils, 20% lymphocytes and 8% monocytes.  Hemoglobin 15.9 g/dL and platelets 220.  PT was 12.7 and INR 0.96.  Lactic acid was normal at 1.2, sodium is 131, potassium 3.6, chloride 95 and CO2 22 mmol/L.  Glucose was 328, BUN 10, creatinine 0.61 and calcium 8.7 mg/dL.  AST decreased at 11 units/L and total bilirubin was mildly elevated at 1.4 mg/dL.  Imaging: A 3 view right foot x-ray series show well-corticated, chronic appearing bony irregularity around the first IP joint, which could be related to old trauma or prior infection.  There was no signs of osteomyelitis.  However MRI should be considered.  Review of Systems: As per HPI otherwise 10 point review of systems negative.    Past Medical History:  Diagnosis Date  . Cancer ([place])    testicular  . Diabetes mellitus without complication ([place])   . Obese   . Tobacco use     Past Surgical History:  Procedure Laterality Date  . KNEE SURGERY    . TOE SURGERY Right      reports that he has been smoking cigarettes.  He has been smoking about 0.50 packs per day. He has never used smokeless tobacco. He reports that he does not drink alcohol or use drugs.  Allergies  Allergen Reactions  . Bee Venom Swelling  . Mushroom Extract Complex Nausea And Vomiting   Family History  Problem Relation Age of Onset  . Breast cancer Mother   . Multiple sclerosis Father   . Diabetes Mellitus II Paternal Grandmother   . Lupus  Paternal Grandmother    Prior to Admission medications   Medication Sig Start Date End Date Taking? Authorizing Provider  insulin NPH-regular Human (NOVOLIN 70/30) (70-30) 100 UNIT/ML injection Inject 50 Units into the skin 2 (two) times daily.    Yes [provider]    Physical Exam: Vitals:   12/16/17 1503 12/16/17 1815 12/16/17 1932 12/16/17 2032  BP:  132/77 130/87 (!) 143/86  Pulse:  70 100 91  Resp:  18 17 (!) 23  Temp:  98.1 F (36.7 C)    TempSrc:  Oral    SpO2:  100% 93% 97%  Weight: 131.1 kg  (289 lb)     Height: 6' (1.829 m)       Constitutional: NAD, calm, comfortable Eyes: PERRL, lids and conjunctivae normal ENMT: Mucous membranes are moist. Posterior pharynx clear of any exudate or lesions. Neck: normal, supple, no masses, no thyromegaly Respiratory: clear to auscultation bilaterally, no wheezing, no crackles. Normal respiratory effort. No accessory muscle use.  Cardiovascular: Regular rate and rhythm, no murmurs / rubs / gallops. No extremity edema. 2+ pedal pulses. No carotid bruits.  Abdomen: Obese, soft, no tenderness, no masses palpated. No hepatosplenomegaly. Bowel sounds positive.  Musculoskeletal: no clubbing / cyanosis. Good ROM, no contractures. Normal muscle tone.  Skin: Positive callus with surrounding erythema, edema, foul smelling purulent discharge, calor and tenderness to palpation on medial aspect of plantar area just before great toe.  Please see pictures below. Neurologic: CN 2-12 grossly intact. Sensation intact, DTR normal. Strength 5/5 in all 4.  Psychiatric: Normal judgment and insight. Alert and oriented x 3. Normal mood.        Labs on Admission: I have personally reviewed following labs and imaging studies  CBC: Recent Labs  Lab 12/16/17 1827  WBC 15.6*  NEUTROABS 12.2*  HGB 15.9  HCT 47.4  MCV 82.3  PLT 841   Basic Metabolic Panel: Recent Labs  Lab 12/16/17 1827 12/16/17 1843  NA 131*  --   K 3.6  --   CL 95*  --   CO2 22  --   GLUCOSE 328*  --   BUN 10  --   CREATININE 0.61  --   CALCIUM 8.7*  --   MG  --  2.0  PHOS  --  3.6   GFR: Estimated Creatinine Clearance: 159.8 mL/min (by C-G formula based on SCr of 0.61 mg/dL). Liver Function Tests: Recent Labs  Lab 12/16/17 1827  AST 11*  ALT 19  ALKPHOS 93  BILITOT 1.4*  PROT 7.7  ALBUMIN 3.6   No results for input(s): LIPASE, AMYLASE in the last 168 hours. No results for input(s): AMMONIA in the last 168 hours. Coagulation Profile: Recent Labs  Lab  12/16/17 1827  INR 0.96   Cardiac Enzymes: No results for input(s): CKTOTAL, CKMB, CKMBINDEX, TROPONINI in the last 168 hours. BNP (last 3 results) No results for input(s): PROBNP in the last 8760 hours. HbA1C: No results for input(s): HGBA1C in the last 72 hours. CBG: No results for input(s): GLUCAP in the last 168 hours. Lipid Profile: No results for input(s): CHOL, HDL, LDLCALC, TRIG, CHOLHDL, LDLDIRECT in the last 72 hours. Thyroid Function Tests: No results for input(s): TSH, T4TOTAL, FREET4, T3FREE, THYROIDAB in the last 72 hours. Anemia Panel: No results for input(s): VITAMINB12, FOLATE, FERRITIN, TIBC, IRON, RETICCTPCT in the last 72 hours. Urine analysis:    Component Value Date/Time   COLORURINE YELLOW 12/16/2017 1900   APPEARANCEUR CLEAR 12/16/2017 1900  LABSPEC 1.036 (H) 12/16/2017 1900   PHURINE 6.0 12/16/2017 1900   GLUCOSEU >=500 (A) 12/16/2017 1900   HGBUR NEGATIVE 12/16/2017 1900   BILIRUBINUR NEGATIVE 12/16/2017 1900   KETONESUR 80 (A) 12/16/2017 1900   PROTEINUR NEGATIVE 12/16/2017 1900   NITRITE NEGATIVE 12/16/2017 1900   LEUKOCYTESUR NEGATIVE 12/16/2017 1900    Radiological Exams on Admission: Dg Chest 2 View  Result Date: 12/16/2017 CLINICAL DATA:  Right great toe ulcer. EXAM: CHEST - 2 VIEW COMPARISON:  Chest x-ray dated March 18, 2017. FINDINGS: The heart size and mediastinal contours are within normal limits. Both lungs are clear. The visualized skeletal structures are unremarkable. IMPRESSION: No active cardiopulmonary disease. Electronically Signed   By: Titus Dubin M.D.   On: 12/16/2017 15:41   Dg Foot Complete Right  Result Date: 12/16/2017 CLINICAL DATA:  Right great toe ulcer. EXAM: RIGHT FOOT COMPLETE - 3+ VIEW COMPARISON:  Right foot x-ray report dated February 21, 2004. FINDINGS: No acute fracture or dislocation. Well corticated, chronic appearing irregularity of the first proximal phalanx head and base of the first distal phalanx.  Mild degenerative changes of the first MTP joint. Joint spaces are preserved. Bone mineralization is normal. Mild forefoot soft tissue swelling. IMPRESSION: 1. Well corticated, chronic appearing bony irregularity around the first IP joint, which could be related to old trauma or prior infection. No definite acute bony destructive changes to suggest osteomyelitis. Consider further evaluation with MRI. Electronically Signed   By: Titus Dubin M.D.   On: 12/16/2017 15:50    EKG: Independently reviewed.   Assessment/Plan Principal Problem:   Diabetic foot ulcer ([place]) Admit to MedSurg/inpatient. Continue IV fluids. Analgesics as needed. Start cefepime 2 g IVPB every 8 hours. Start metronidazole 500 mg IVPB every 8 hours. Continue vancomycin per pharmacy. Check ESR, CRP and prealbumin. Check MRI of the foot to rule out osteomyelitis in the morning. Consult podiatry.  Active Problems:   Type 2 diabetes mellitus ([facility]) Check hemoglobin A1c. Carbohydrate modified diet. Continue 70/30 insulin 50 units SQ twice daily. I stressed the importance of establishing with a PCP for regular medical care. The patient voiced understanding.  He will establish with PCP and become more involved/proactive in his health care    Tobacco use Per patient, he quit about over a week ago. Nicotine replacement therapy as needed order. Tobacco cessation information to be provided by the staff.    BMI 39.0-39.9,adult Discussed briefly with the patient. He will establish with PCP and modify lifestyle substantially.    DVT prophylaxis: Lovenox SQ. Code Status: Full code. Family Communication:  Disposition Plan: Admit for IV antibiotic therapy and further work-up. Consults called: Routine Podiatry and/or Orthopedic surgery consult. Admission status: Inpatient/telemetry.   Reubin Milan MD [facility] Pager [phone].  If 7PM-7AM, please contact night-coverage [email or web address] Password  [facility]  12/16/2017, 8:49 PM   This document was prepared using Dragon voice recognition software and may contain some unintended transcription errors.

## 2017-12-16 NOTE — ED Triage Notes (Signed)
Pt reports a wound to his right foot since 2014.  Today the wound opened with odor and redness spreading up foot.

## 2017-12-16 NOTE — ED Notes (Signed)
Patient up to this RN to state " I have an infection in my foot and I am a diabetic, and the wound is bleeding - I am concerned and think I should go back to a room right away" - Patient updated at this time about wait time and how many patients are in the department and the severity of the patients - Patient states that he should be a priority. Patient states that maybe he should go to another ER and maybe it will be a shorter wait. Patient encouraged to stay. Patient upset because he was not able to go straight to a room. Patient tells this RN that he is going to sign out and go to another ER. Once again patient encouraged to stay but walks out the door upset.

## 2017-12-17 ENCOUNTER — Inpatient Hospital Stay (HOSPITAL_COMMUNITY): Payer: 59

## 2017-12-17 ENCOUNTER — Encounter (HOSPITAL_COMMUNITY): Payer: Self-pay | Admitting: Internal Medicine

## 2017-12-17 DIAGNOSIS — E871 Hypo-osmolality and hyponatremia: Secondary | ICD-10-CM

## 2017-12-17 DIAGNOSIS — E1143 Type 2 diabetes mellitus with diabetic autonomic (poly)neuropathy: Secondary | ICD-10-CM

## 2017-12-17 DIAGNOSIS — Z794 Long term (current) use of insulin: Secondary | ICD-10-CM

## 2017-12-17 LAB — CBC WITH DIFFERENTIAL/PLATELET
BASOS PCT: 0 %
Basophils Absolute: 0 10*3/uL (ref 0.0–0.1)
Eosinophils Absolute: 0.2 10*3/uL (ref 0.0–0.7)
Eosinophils Relative: 1 %
HEMATOCRIT: 42.4 % (ref 39.0–52.0)
Hemoglobin: 13.7 g/dL (ref 13.0–17.0)
Lymphocytes Relative: 21 %
Lymphs Abs: 2.9 10*3/uL (ref 0.7–4.0)
MCH: 27.2 pg (ref 26.0–34.0)
MCHC: 32.3 g/dL (ref 30.0–36.0)
MCV: 84.3 fL (ref 78.0–100.0)
MONO ABS: 1.3 10*3/uL — AB (ref 0.1–1.0)
Monocytes Relative: 9 %
NEUTROS ABS: 9.6 10*3/uL — AB (ref 1.7–7.7)
Neutrophils Relative %: 69 %
Platelets: 243 10*3/uL (ref 150–400)
RBC: 5.03 MIL/uL (ref 4.22–5.81)
RDW: 15.3 % (ref 11.5–15.5)
WBC: 14 10*3/uL — ABNORMAL HIGH (ref 4.0–10.5)

## 2017-12-17 LAB — GLUCOSE, CAPILLARY
GLUCOSE-CAPILLARY: 292 mg/dL — AB (ref 65–99)
Glucose-Capillary: 306 mg/dL — ABNORMAL HIGH (ref 65–99)
Glucose-Capillary: 322 mg/dL — ABNORMAL HIGH (ref 65–99)
Glucose-Capillary: 271 mg/dL — ABNORMAL HIGH (ref 65–99)

## 2017-12-17 LAB — HEMOGLOBIN A1C
Hgb A1c MFr Bld: 13.1 % — ABNORMAL HIGH (ref 4.8–5.6)
Mean Plasma Glucose: 329.27 mg/dL

## 2017-12-17 LAB — BASIC METABOLIC PANEL
Anion gap: 9 (ref 5–15)
BUN: 11 mg/dL (ref 6–20)
CALCIUM: 8 mg/dL — AB (ref 8.9–10.3)
CO2: 26 mmol/L (ref 22–32)
Chloride: 100 mmol/L — ABNORMAL LOW (ref 101–111)
Creatinine, Ser: 0.64 mg/dL (ref 0.61–1.24)
GFR calc Af Amer: >60 mL/min (ref >60)
GLUCOSE: 337 mg/dL — AB (ref 65–99)
Potassium: 3.7 mmol/L (ref 3.5–5.1)
Sodium: 135 mmol/L (ref 135–145)

## 2017-12-17 LAB — MRSA PCR SCREENING: MRSA by PCR: NEGATIVE

## 2017-12-17 LAB — PREALBUMIN: PREALBUMIN: 16.9 mg/dL — AB (ref 18–38)

## 2017-12-17 LAB — C-REACTIVE PROTEIN: CRP: 18.8 mg/dL — ABNORMAL HIGH (ref <1.0)

## 2017-12-17 MED ORDER — PIPERACILLIN-TAZOBACTAM 3.375 G IVPB
3.3750 g | Freq: Three times a day (TID) | INTRAVENOUS | Status: AC
  Administered 2017-12-17: 3.375 g via INTRAVENOUS
  Filled 2017-12-17: qty 50

## 2017-12-17 MED ORDER — NICOTINE 21 MG/24HR TD PT24: 21.0000 mg | MEDICATED_PATCH | Freq: Every day | TRANSDERMAL | Status: DC | PRN

## 2017-12-17 MED ORDER — PIPERACILLIN-TAZOBACTAM 3.375 G IVPB
3.3750 g | Freq: Three times a day (TID) | INTRAVENOUS | Status: DC
Start: 2017-12-17 — End: 2017-12-17

## 2017-12-17 MED ORDER — OXYCODONE HCL 5 MG PO TABS
5.0000 mg | ORAL_TABLET | ORAL | Status: DC | PRN
  Administered 2017-12-18 (×2): 5 mg via ORAL
  Filled 2017-12-17 (×3): qty 1

## 2017-12-17 MED ORDER — PIPERACILLIN-TAZOBACTAM 3.375 G IVPB: 3.3750 g | Freq: Three times a day (TID) | INTRAVENOUS | Status: DC

## 2017-12-17 MED ORDER — ADULT MULTIVITAMIN W/MINERALS CH
1.0000 | ORAL_TABLET | Freq: Every day | ORAL | Status: DC
  Administered 2017-12-17 – 2017-12-20 (×4): 1 via ORAL
  Filled 2017-12-17 (×4): qty 1

## 2017-12-17 MED ORDER — PIPERACILLIN-TAZOBACTAM 3.375 G IVPB
3.3750 g | Freq: Three times a day (TID) | INTRAVENOUS | Status: DC
  Administered 2017-12-17 – 2017-12-19 (×6): 3.375 g via INTRAVENOUS
  Filled 2017-12-17 (×6): qty 50

## 2017-12-17 MED ORDER — GADOBENATE DIMEGLUMINE 529 MG/ML IV SOLN
20.0000 mL | Freq: Once | INTRAVENOUS | Status: AC | PRN
  Administered 2017-12-17: 20 mL via INTRAVENOUS

## 2017-12-17 MED ORDER — HEPARIN SODIUM (PORCINE) 5000 UNIT/ML IJ SOLN
5000.0000 [IU] | Freq: Three times a day (TID) | INTRAMUSCULAR | Status: DC
  Administered 2017-12-17 – 2017-12-20 (×8): 5000 [IU] via SUBCUTANEOUS
  Filled 2017-12-17 (×8): qty 1

## 2017-12-17 MED ORDER — JUVEN PO PACK
1.0000 | PACK | Freq: Two times a day (BID) | ORAL | Status: DC
  Administered 2017-12-17 – 2017-12-19 (×3): 1 via ORAL
  Filled 2017-12-17 (×4): qty 1

## 2017-12-17 NOTE — Consult Note (Signed)
[place] Nurse wound consult note Reason for Consult: Right great toe diabetic foot wound I spoke with Dr. Dyann Kief via phone to update him on my recommendations for surgical consult.  Dr. Dyann Kief will f/u as indicated. Val Riles, RN, MSN, CWOCN, CNS-BC, pager [phone]

## 2017-12-17 NOTE — Progress Notes (Signed)
PROGRESS NOTE    Zachary Schneider  SKA:[id number] DOB: 26-Nov-1970 DOA: 12/16/2017 PCP: Default, Provider, MD   Brief Narrative:  47 y.o. male with medical history significant of testicular cancer, type 2 diabetes, morbid obesity, tobacco use who is coming to the emergency department due to his chronic right foot callus becoming ulcerated, tender with a malodorous discharge.  Per patient, he has required debridement in the past of his calluses, but he has never been this severe.  Over the weekend, he noticed that this was bothering a little bit more than usual, but he was able to get up and put up some shoes this morning.  He made an appointment to see podiatry tomorrow.  However around noontime today, he noticed he developed intense pain in the area.  He subsequently took his shoe and noticed that there was a lot more erythema, edema and foul-smelling discharge than earlier.  So he decided not to wait until he sees podiatry tomorrow and came to the emergency department.  He mentions that his blood glucoses have been in the 200 range, but over the last few days they have been higher than usual.  He denies fever, chills, sore throat, dyspnea, chest pain, palpitations, dizziness, orthopnea, recent pitting edema lower extremities, abdominal pain, nausea, emesis, diarrhea, melena, constipation, hematochezia, dysuria, frequency, hematuria or oliguria.  Denies polyuria, polydipsia or polyphagia.  No heat or cold intolerance.  Assessment & Plan: 1-diabetic foot ulcer ([place]) -Continue IV antibiotics -Podiatry service has been consulted and planning to perform surgical debridement on 6/19 -Good ankle-brachial indices on arterial ultrasound and negative MRI for osteomyelitis.  2-Type 2 diabetes mellitus (HCC) -A1c pending -CBGs 29 region 300 range. -Continue sliding scale insulin on 7030 -Modified carbohydrates diet has been ordered.  3-Tobacco use -I have discussed tobacco cessation with the patient.  I  have counseled the patient regarding the negative impacts of continued tobacco use including but not limited to lung cancer, COPD, and cardiovascular disease.  I have discussed alternatives to tobacco and modalities that may help facilitate tobacco cessation including but not limited to biofeedback, hypnosis, and medications.  Total time spent with tobacco counseling was 5 minutes. -nicotine patch ordered   4-morbid obesity: class 2, due to excess calorie intake. -BMI 39.20 Kg/m2 -low calorie diet, increase physical activity and   5-sepsis: patient met sepsis criteria on admission -due to diabetic foot ulcer -will continue IV antibiotics -follow sepsis features improvement  -continue supportive care  6-pseudohyponatremia -improved/resolved after IVF's and CBG's control. -follow electrolytes trend.  DVT prophylaxis: heparin Code Status: Full code Family Communication: No family at bedside. Disposition Plan: Continue IV antibiotics, continue IVF's; following Dr. Caprice Beaver recommendations plan is for surgical debridement on 12/18/2017.  Will follow further wound care recommendations.  Consultants:   Podiatry (Dr. Caprice Beaver)  Procedures:   See below for x-ray reports.  MRI without osteomyelitis appreciated  Lower extremity arterial ultrasound: Normal resting ankle-brachial indices suggesting good blood flow.  Antimicrobials:  Anti-infectives (From admission, onward)   Start     Dose/Rate Route Frequency Ordered Stop   12/17/17 2000  piperacillin-tazobactam (ZOSYN) IVPB 3.375 g     3.375 g 12.5 mL/hr over 240 Minutes Intravenous Every 8 hours 12/17/17 0905     12/17/17 1800  piperacillin-tazobactam (ZOSYN) IVPB 3.375 g  Status:  Discontinued     3.375 g 12.5 mL/hr over 240 Minutes Intravenous Every 8 hours 12/17/17 0901 12/17/17 0905   12/17/17 1200  piperacillin-tazobactam (ZOSYN) IVPB 3.375 g  3.375 g 100 mL/hr over 30 Minutes Intravenous Every 8 hours 12/17/17 0904 12/17/17  1220   12/17/17 1000  piperacillin-tazobactam (ZOSYN) IVPB 3.375 g  Status:  Discontinued     3.375 g 100 mL/hr over 30 Minutes Intravenous Every 8 hours 12/17/17 0843 12/17/17 0904   12/17/17 0500  vancomycin (VANCOCIN) 1,500 mg in sodium chloride 0.9 % 500 mL IVPB     1,500 mg 250 mL/hr over 120 Minutes Intravenous Every 8 hours 12/16/17 2046     12/16/17 2200  ceFEPIme (MAXIPIME) 2 g in sodium chloride 0.9 % 100 mL IVPB  Status:  Discontinued     2 g 200 mL/hr over 30 Minutes Intravenous Every 8 hours 12/16/17 2046 12/17/17 0843   12/16/17 2100  metroNIDAZOLE (FLAGYL) IVPB 500 mg  Status:  Discontinued     500 mg 100 mL/hr over 60 Minutes Intravenous Every 8 hours 12/16/17 2009 12/17/17 0843   12/16/17 2100  vancomycin (VANCOCIN) IVPB 1000 mg/200 mL premix     1,000 mg 200 mL/hr over 60 Minutes Intravenous  Once 12/16/17 2046 12/17/17 0039   12/16/17 1945  vancomycin (VANCOCIN) IVPB 1000 mg/200 mL premix     1,000 mg 200 mL/hr over 60 Minutes Intravenous  Once 12/16/17 1938 12/16/17 2124      Subjective: Afebrile, no chest pain, no nausea, no vomiting.  Patient complaining of  swelling, erythema and opening/draining wound on his right foot.  Objective: Vitals:   12/16/17 1932 12/16/17 2032 12/16/17 2140 12/17/17 0615  BP: 130/87 (!) 143/86 137/88 120/72  Pulse: 100 91 92 74  Resp: 17 (!) _0 Temp:   97.7 F (36.5 C) 98.6 F (37 C)  TempSrc:   Oral Oral  SpO2: 93% 97% 99% 95%  Weight:      Height:        Intake/Output Summary (Last 24 hours) at 12/17/2017 1255 Last data filed at 12/17/2017 0900 Gross per 24 hour  Intake 2530 ml  Output 2 ml  Net 2528 ml   Filed Weights   12/16/17 1503  Weight: 131.1 kg (289 lb)    Examination: General exam: Alert, awake, oriented x 3; denies CP and SOB. In no acute distress.  Respiratory system: Clear to auscultation. Respiratory effort normal. Cardiovascular system:RRR. No murmurs, rubs, gallops. Gastrointestinal system:  Abdomen is Obese, nondistended, soft and nontender. No organomegaly or masses felt. Normal bowel sounds heard. Central nervous system: Alert and oriented. No focal neurological deficits. Patient reported chronic numbness and decrease proprioception in his feet bilaterally.  Extremities: right pedal edema appreciated; no cyanosis, no clubbing.  Skin: right foot with open wound, purulent discharge, bad odor and fluctuance appreciated. Able to feel pressure, but no pain. Psychiatry: Judgement and insight appear normal. Mood & affect appropriate.    Data Reviewed: I have personally reviewed following labs and imaging studies  CBC: Recent Labs  Lab 12/16/17 1827 12/17/17 0505  WBC 15.6* 14.0*  NEUTROABS 12.2* 9.6*  HGB 15.9 13.7  HCT 47.4 42.4  MCV 82.3 84.3  PLT 220 382   Basic Metabolic Panel: Recent Labs  Lab 12/16/17 1827 12/16/17 1843 12/17/17 0505  NA 131*  --  135  K 3.6  --  3.7  CL 95*  --  100*  CO2 22  --  26  GLUCOSE 328*  --  337*  BUN 10  --  11  CREATININE 0.61  --  0.64  CALCIUM 8.7*  --  8.0*  MG  --  2.0  --   PHOS  --  3.6  --    GFR: Estimated Creatinine Clearance: 159.8 mL/min (by C-G formula based on SCr of 0.64 mg/dL).   Liver Function Tests: Recent Labs  Lab 12/16/17 1827  AST 11*  ALT 19  ALKPHOS 93  BILITOT 1.4*  PROT 7.7  ALBUMIN 3.6   Coagulation Profile: Recent Labs  Lab 12/16/17 1827  INR 0.96   CBG: Recent Labs  Lab 12/16/17 2108 12/17/17 0848 12/17/17 1144  GLUCAP 321* 292* 306*   Urine analysis:    Component Value Date/Time   COLORURINE YELLOW 12/16/2017 1900   APPEARANCEUR CLEAR 12/16/2017 1900   LABSPEC 1.036 (H) 12/16/2017 1900   PHURINE 6.0 12/16/2017 1900   GLUCOSEU >=500 (A) 12/16/2017 1900   HGBUR NEGATIVE 12/16/2017 1900   BILIRUBINUR NEGATIVE 12/16/2017 1900   KETONESUR 80 (A) 12/16/2017 1900   PROTEINUR NEGATIVE 12/16/2017 1900   NITRITE NEGATIVE 12/16/2017 1900   LEUKOCYTESUR NEGATIVE 12/16/2017 1900     Recent Results (from the past 240 hour(s))  Culture, blood (Routine x 2)     Status: None (Preliminary result)   Collection Time: 12/16/17  6:32 PM  Result Value Ref Range Status   Specimen Description LEFT ANTECUBITAL  Final   Special Requests   Final    BOTTLES DRAWN AEROBIC AND ANAEROBIC Blood Culture adequate volume   Culture   Final    NO GROWTH < 24 HOURS Performed at [facility], [address] [place], [place] [address]    Report Status PENDING  Incomplete  Culture, blood (Routine x 2)     Status: None (Preliminary result)   Collection Time: 12/16/17  6:39 PM  Result Value Ref Range Status   Specimen Description RIGHT ANTECUBITAL  Final   Special Requests   Final    BOTTLES DRAWN AEROBIC AND ANAEROBIC Blood Culture adequate volume   Culture   Final    NO GROWTH < 24 HOURS Performed at [facility], [address], [place], [place] [address]    Report Status PENDING  Incomplete  MRSA PCR Screening     Status: None   Collection Time: 12/17/17  1:17 AM  Result Value Ref Range Status   MRSA by PCR NEGATIVE NEGATIVE Final    Comment:        The GeneXpert MRSA Assay (FDA approved for NASAL specimens only), is one component of a comprehensive MRSA colonization surveillance program. It is not intended to diagnose MRSA infection nor to guide or monitor treatment for MRSA infections. Performed at [facility], [address], [place], [place] [address]      Radiology Studies: Dg Chest 2 View  Result Date: 12/16/2017 CLINICAL DATA:  Right great toe ulcer. EXAM: CHEST - 2 VIEW COMPARISON:  Chest x-ray dated March 18, 2017. FINDINGS: The heart size and mediastinal contours are within normal limits. Both lungs are clear. The visualized skeletal structures are unremarkable. IMPRESSION: No active cardiopulmonary disease. Electronically Signed   By: Titus Dubin M.D.   On: 12/16/2017 15:41   Mri Right Foot With And Without Contrast  Result Date: 12/17/2017 CLINICAL  DATA:  Great toe ulcer.  Evaluate for osteomyelitis. EXAM: MRI OF THE RIGHT FOREFOOT WITHOUT AND WITH CONTRAST TECHNIQUE: Multiplanar, multisequence MR imaging of the right forefoot was performed before and after the administration of intravenous contrast. CONTRAST:  39m MULTIHANCE GADOBENATE DIMEGLUMINE 529 MG/ML IV SOLN COMPARISON:  Right foot x-rays from yesterday. FINDINGS: Bones/Joint/Cartilage No marrow signal abnormality. No fracture  or dislocation. Normal alignment. No joint effusion. Mild osteoarthritis of the first MTP joint. Irregularity of the first IP joint, unchanged. Ligaments Collateral ligaments are intact.  Lisfranc ligament is intact. Muscles and Tendons Flexor, peroneal and extensor compartment tendons are intact. Diffuse fatty atrophy of the intrinsic muscles of the forefoot. Soft tissue Small superficial plantar soft tissue ulceration at the level of the first proximal phalanx base with adjacent mild, enhancing soft tissue swelling involving the great toe. No fluid collection or hematoma. No soft tissue mass. Mild dorsal forefoot soft tissue swelling without enhancement. IMPRESSION: 1. Small superficial soft tissue ulceration along the plantar great toe with adjacent enhancing soft tissue swelling, consistent with cellulitis. No evidence of osteomyelitis or drainable fluid collection. 2. Chronic irregularity of the first IP joint again noted, likely related to prior trauma or old infection. Electronically Signed   By: Titus Dubin M.D.   On: 12/17/2017 08:43   US Arterial Abi (screening Lower Extremity)  Result Date: 12/17/2017 CLINICAL DATA:  Right big toe ulcer. EXAM: NONINVASIVE PHYSIOLOGIC VASCULAR STUDY OF BILATERAL LOWER EXTREMITIES TECHNIQUE: Evaluation of both lower extremities were performed at rest, including calculation of ankle-brachial indices with single level Doppler, pressure and pulse volume recording. COMPARISON:  None. FINDINGS: Right ABI:  1.07 Left ABI:  1.07 Right  Lower Extremity:  Normal arterial waveforms at the ankle. Left Lower Extremity:  Normal arterial waveforms at the ankle. IMPRESSION: Normal resting ankle-brachial indices. Electronically Signed   By: Markus Daft M.D.   On: 12/17/2017 09:02   Dg Foot Complete Right  Result Date: 12/16/2017 CLINICAL DATA:  Right great toe ulcer. EXAM: RIGHT FOOT COMPLETE - 3+ VIEW COMPARISON:  Right foot x-ray report dated February 21, 2004. FINDINGS: No acute fracture or dislocation. Well corticated, chronic appearing irregularity of the first proximal phalanx head and base of the first distal phalanx. Mild degenerative changes of the first MTP joint. Joint spaces are preserved. Bone mineralization is normal. Mild forefoot soft tissue swelling. IMPRESSION: 1. Well corticated, chronic appearing bony irregularity around the first IP joint, which could be related to old trauma or prior infection. No definite acute bony destructive changes to suggest osteomyelitis. Consider further evaluation with MRI. Electronically Signed   By: Titus Dubin M.D.   On: 12/16/2017 15:50    Scheduled Meds: . heparin injection (subcutaneous)  5,000 Units Subcutaneous Q8H  . insulin aspart  0-20 Units Subcutaneous TID WC  . insulin aspart protamine- aspart  50 Units Subcutaneous BID WC  . nutrition supplement (JUVEN)  1 packet Oral BID BM   Continuous Infusions: . sodium chloride 125 mL/hr at 12/16/17 2249  . piperacillin-tazobactam    . vancomycin Stopped (12/17/17 0730)     LOS: 1 day    Time spent: 35 minutes. Greater than 50% of this time was spent in direct contact with the patient, coordinating care and discussing relevant ongoing clinical issues, including education about diabetes control, importance of checking skin/feet aspect on daily basis, diet/portion management to assist with weight loss and also the need for surgical debridement to help with wound care/healing.    Barton Dubois, MD [facility] Pager  [phone]  If 7PM-7AM, please contact night-coverage [email or web address] Password [facility] 12/17/2017, 12:55 PM

## 2017-12-17 NOTE — Clinical Social Work Note (Signed)
CSW consult entered for medication assistance at dc. RN CM will address this concern. Will clear the consult.

## 2017-12-17 NOTE — Care Management (Signed)
CM consulted for medication needs?, HH and DME. Pt is ind from home, has insurance and PCP. Pt says he has no issues with medications. He is not homebound and not eligible for [facility] services. No specific DME needs identified at this time. CM may be re-consulted if specific needs arise. Marland Kitchen

## 2017-12-17 NOTE — Consult Note (Addendum)
[place] Nurse wound consult note Reason for Consult:  Right foot diabetic ulcer involving the great toe.  Consult was performed by remote camera with assistance of bedside RN. Wound type: Diabetic Foot wound POA: Yes Measurement: Entire discolored area measures 6.2 cm x 6.0 cm  The dorsum of the toe has an open area that measures 0.4 cm x 0.3 cm x 0.2 cm and is oozing fluid.  The plantar aspect of the toe has a darkened area that measures 1.4 cm x 0.8 cm x unknown depth.  Much of the toe, according to the RN assisting with the remote assessment, is boggy.  Photos in the note by Dr. Olevia Bowens and his note indicate the area is heavily callused. Plan of care:  Apply betadine to the entire toe area.  Cover with dry gauze.  Change each shift.  I RECOMMEND A SURGICAL CONSULT FOR FURTHER EVALUATION and potential surgical intervention.  I have paged Dr. Dyann Kief to discuss my recommendation.  I am currently awaiting a call back. Monitor the wound area(s) for worsening of condition such as: Signs/symptoms of infection,  Increase in size,  Development of or worsening of odor, Development of pain, or increased pain at the affected locations.  Notify the medical team if any of these develop.  Thank you for the consult.  Discussed plan of care with the patient and bedside nurse.  [place] nurse will not follow at this time.  Please re-consult the [place] team if needed.  Val Riles, RN, MSN, CWOCN, CNS-BC, pager [phone]

## 2017-12-17 NOTE — Progress Notes (Signed)
Initial Nutrition Assessment  DOCUMENTATION CODES:   Obesity unspecified  INTERVENTION:   Juven po BID, each supplement provides 80 kcal and 14 grams amino acids  Multivitamin daily  NUTRITION DIAGNOSIS:   Increased nutrient needs related to wound healing as evidenced by chronic diabetic ulcer on great right toe.  GOAL:   Patient will meet greater than or equal to 90% of their needs   MONITOR:   PO intake, Supplement acceptance, Skin  REASON FOR ASSESSMENT:   Consult Wound healing  ASSESSMENT:  47 y/o male with Hx of testicular cancer, DM2, obesity, and tobacco use.  Admitted for DM2 ulcer.  Consult Dietitian for wound healing.  Pt reported very good appetite.  Pt does not manage DM2 through diet and moderately manages his blood sugars with insulin.  Morning blood sugar is usually 215-230.  Pt reports the following as a typical diet: B-eggs, sausage with orange juice or bottled flavored coffee, L- either a couple bags of chips OR basic salad with ranch dressing, drinks include regular soda, water, or bottled green tea (sweetened), S-(lives with Aunt, so anything she makes) fried or grilled chicken, hamburgers, ham steaks, sometimes just snacks (chips and queso), or sometimes takeout which happens 3-4 times/week (ex: [place], [employer]), and drinks beverages Aunt keeps in house - mostly diet soda.  Drinks "a lot" of milk before bed.  Pt does not take any supplements or vitamins at home and denies any nausea, vomiting, constipation, or diarrhea.    Pt wt went down since last year (315 in September), but pt said this was intentional in attempts to improve his health.  No are no concerns for malnutrition.    RD intern asked pt if he knew how to manage his diabetes through his diet.  Pt answered no and expressed how he wished his Grandmother were alive because she "had her diabetes down to a science".  RD intern offered education for DM2, but pt declined saying he had had it before.   Conversation did lead to some educational topics and RD intern briefly dicussed label reading, carb counting, cutting out sugar-sweetened beverages from diet, and utilizing electronic sources [employer], [email or web address]) for independent education after discharge. Pt seemed receptive to lifestyle changes as he already has lost some weight and mentioned if he lost more that his diabetes "might go away".    Currently pt is eating well, so interventions to improve wound healing will be Juven BID and a MVI.     Labs reviewed: Cl: 100, Glucose 337, A1C (Jan 2019): 12.2 Recent Labs  Lab 12/16/17 1827 12/16/17 1843 12/17/17 0505  NA 131*  --  135  K 3.6  --  3.7  CL 95*  --  100*  CO2 22  --  26  BUN 10  --  11  CREATININE 0.61  --  0.64  CALCIUM 8.7*  --  8.0*  MG  --  2.0  --   PHOS  --  3.6  --   GLUCOSE 328*  --  337*   Medications: Heparin, insulin, saline, zosyn, vancocin   Past Medical History:  Diagnosis Date  . Cancer ([place])    testicular  . Diabetes mellitus without complication ([place])   . Obese   . Tobacco use    NUTRITION - FOCUSED PHYSICAL EXAM:  Deferred: no signs of malnutrition in wt hx or eating habits  Diet Order:   Diet Order           Diet heart healthy/carb  modified Room service appropriate? Yes; Fluid consistency: Thin  Diet effective now         EDUCATION NEEDS:   Not appropriate for education at this time(pt said he has had education before and declined going through it again.)  Skin:  Skin Assessment: Skin Integrity Issues: Skin Integrity Issues:: Incisions Incisions: Wound/Blister: diabetic ulcer on great right toe - clean, dry, pink, red  Last BM:  6/17 (last recorded)  Height:   Ht Readings from Last 1 Encounters:  12/16/17 6' (1.829 m)   Weight:   Wt Readings from Last 1 Encounters:  12/16/17 289 lb (131.1 kg)   Ideal Body Weight:  80.7 kg  BMI:  Body mass index is 39.2 kg/m.  Estimated Nutritional Needs:   Kcal:   2100-2340 kcal (26-29 kcal/kg IBW)  Protein:  105-117 g (20% kcal)  Fluid:  2100-2300 mL (53mL/kcal)

## 2017-12-17 NOTE — Consult Note (Signed)
Podiatry Consult Note  Reason for Consultation:  Diabetic foot ulcer  History of Present Illness: Zachary Schneider is a 47 y.o. male who presented to emergency department yesterday complaining of malodorous discharge from his right foot.  He relates that the callus on his right foot has been present for an extended duration requiring debridements in the past by his podiatrist (Add[facility]).  The callus started to bother him and he scheduled an appointment with his podiatrist.  He noticed more erythema and drainage and decided not to wait until his scheduled appointment.  His blood glucose readings have been elevated compared to his historic norms.    He was admitted to hospitalist service.  Radiographs, noninvasive arterial studies and an MRI has been performed.  He was placed on vancomycin and Zosyn.  Podiatry service consulted for management of diabetic foot ulcer.   Past Medical History:  Diagnosis Date  . Cancer ([place])    testicular  . Diabetes mellitus without complication ([place])   . Obese   . Tobacco use    Scheduled Meds: . heparin injection (subcutaneous)  5,000 Units Subcutaneous Q8H  . insulin aspart  0-20 Units Subcutaneous TID WC  . insulin aspart protamine- aspart  50 Units Subcutaneous BID WC   Continuous Infusions: . sodium chloride 125 mL/hr at 12/16/17 2249  . piperacillin-tazobactam 3.375 g (12/17/17 1150)  . piperacillin-tazobactam    . vancomycin Stopped (12/17/17 0730)   PRN Meds:.ketorolac, nicotine, oxyCODONE  Allergies  Allergen Reactions  . Bee Venom Swelling  . Mushroom Extract Complex Nausea And Vomiting   Past Surgical History:  Procedure Laterality Date  . KNEE SURGERY    . TOE SURGERY Right    Family History  Problem Relation Age of Onset  . Breast cancer Mother   . Multiple sclerosis Father   . Diabetes Mellitus II Paternal Grandmother   . Lupus Paternal Grandmother    Social History:  reports that he has quit smoking. His smoking  use included cigarettes. He smoked 0.50 packs per day. He has never used smokeless tobacco. He reports that he does not drink alcohol or use drugs.  Review of Systems: Significant for findings described in HPI.  Denies nausea, vomiting, fever or chills.   Physical Examination: Vital signs in last 24 hours:   Temp:  [97.7 F (36.5 C)-98.9 F (37.2 C)] 98.6 F (37 C) (06/18 0615) Pulse Rate:  [70-114] 74 (06/18 0615) Resp:  [17-23] 18 (06/18 0615) BP: (120-147)/(72-100) 120/72 (06/18 0615) SpO2:  [93 %-100 %] 95 % (06/18 0615) Weight:  [289 lb (131.1 kg)-289 lb 11 oz (131.4 kg)] 289 lb (131.1 kg) (06/17 1503)  GENERAL:  Alert, NAD. EQUIPMENT: Darco shoe at bedside.   DRESSING: Intact on right foot. INTEGUMENT: Large hyperkeratotic lesion plantar to the right first metatarsal head extending to the plantar surface of the right hallux and medial aspect of the right hallux.  Fluctuance present.  Malodor present.  Periwound erythema present extending to the dorsum of the foot. VASCULAR: Dorsalis pedis pulses palpable.  Posterior tibial pulse is obscured by edema.  There is edema of the right foot. MUSCULOSKELETAL: Calves are soft and nontender.  Lab/Test Results:   Recent Labs    12/16/17 1827 12/17/17 0505  WBC 15.6* 14.0*  HGB 15.9 13.7  HCT 47.4 42.4  PLT 220 243  NA 131* 135  K 3.6 3.7  CL 95* 100*  CO2 22 26  BUN 10 11  CREATININE 0.61 0.64  GLUCOSE 328*  337*  CALCIUM 8.7* 8.0*    Recent Results (from the past 240 hour(s))  Culture, blood (Routine x 2)     Status: None (Preliminary result)   Collection Time: 12/16/17  6:32 PM  Result Value Ref Range Status   Specimen Description LEFT ANTECUBITAL  Final   Special Requests   Final    BOTTLES DRAWN AEROBIC AND ANAEROBIC Blood Culture adequate volume   Culture   Final    NO GROWTH < 24 HOURS Performed at [facility], [address] [place], [place] [address]    Report Status PENDING  Incomplete  Culture, blood  (Routine x 2)     Status: None (Preliminary result)   Collection Time: 12/16/17  6:39 PM  Result Value Ref Range Status   Specimen Description RIGHT ANTECUBITAL  Final   Special Requests   Final    BOTTLES DRAWN AEROBIC AND ANAEROBIC Blood Culture adequate volume   Culture   Final    NO GROWTH < 24 HOURS Performed at [facility], [address], [place], [place] [address]    Report Status PENDING  Incomplete  MRSA PCR Screening     Status: None   Collection Time: 12/17/17  1:17 AM  Result Value Ref Range Status   MRSA by PCR NEGATIVE NEGATIVE Final    Comment:        The GeneXpert MRSA Assay (FDA approved for NASAL specimens only), is one component of a comprehensive MRSA colonization surveillance program. It is not intended to diagnose MRSA infection nor to guide or monitor treatment for MRSA infections. Performed at [facility], [address] [place], [place] [address]      Dg Chest 2 View  Result Date: 12/16/2017 CLINICAL DATA:  Right great toe ulcer. EXAM: CHEST - 2 VIEW COMPARISON:  Chest x-ray dated March 18, 2017. FINDINGS: The heart size and mediastinal contours are within normal limits. Both lungs are clear. The visualized skeletal structures are unremarkable. IMPRESSION: No active cardiopulmonary disease. Electronically Signed   By: Titus Dubin M.D.   On: 12/16/2017 15:41   Mri Right Foot With And Without Contrast  Result Date: 12/17/2017 CLINICAL DATA:  Great toe ulcer.  Evaluate for osteomyelitis. EXAM: MRI OF THE RIGHT FOREFOOT WITHOUT AND WITH CONTRAST TECHNIQUE: Multiplanar, multisequence MR imaging of the right forefoot was performed before and after the administration of intravenous contrast. CONTRAST:  57mL MULTIHANCE GADOBENATE DIMEGLUMINE 529 MG/ML IV SOLN COMPARISON:  Right foot x-rays from yesterday. FINDINGS: Bones/Joint/Cartilage No marrow signal abnormality. No fracture or dislocation. Normal alignment. No joint effusion. Mild osteoarthritis of  the first MTP joint. Irregularity of the first IP joint, unchanged. Ligaments Collateral ligaments are intact.  Lisfranc ligament is intact. Muscles and Tendons Flexor, peroneal and extensor compartment tendons are intact. Diffuse fatty atrophy of the intrinsic muscles of the forefoot. Soft tissue Small superficial plantar soft tissue ulceration at the level of the first proximal phalanx base with adjacent mild, enhancing soft tissue swelling involving the great toe. No fluid collection or hematoma. No soft tissue mass. Mild dorsal forefoot soft tissue swelling without enhancement. IMPRESSION: 1. Small superficial soft tissue ulceration along the plantar great toe with adjacent enhancing soft tissue swelling, consistent with cellulitis. No evidence of osteomyelitis or drainable fluid collection. 2. Chronic irregularity of the first IP joint again noted, likely related to prior trauma or old infection. Electronically Signed   By: Titus Dubin M.D.   On: 12/17/2017 08:43   US Arterial Abi (screening Lower Extremity)  Result Date: 12/17/2017 CLINICAL DATA:  Right big toe ulcer. EXAM: NONINVASIVE PHYSIOLOGIC VASCULAR STUDY OF BILATERAL LOWER EXTREMITIES TECHNIQUE: Evaluation of both lower extremities were performed at rest, including calculation of ankle-brachial indices with single level Doppler, pressure and pulse volume recording. COMPARISON:  None. FINDINGS: Right ABI:  1.07 Left ABI:  1.07 Right Lower Extremity:  Normal arterial waveforms at the ankle. Left Lower Extremity:  Normal arterial waveforms at the ankle. IMPRESSION: Normal resting ankle-brachial indices. Electronically Signed   By: Markus Daft M.D.   On: 12/17/2017 09:02   Dg Foot Complete Right  Result Date: 12/16/2017 CLINICAL DATA:  Right great toe ulcer. EXAM: RIGHT FOOT COMPLETE - 3+ VIEW COMPARISON:  Right foot x-ray report dated February 21, 2004. FINDINGS: No acute fracture or dislocation. Well corticated, chronic appearing irregularity of  the first proximal phalanx head and base of the first distal phalanx. Mild degenerative changes of the first MTP joint. Joint spaces are preserved. Bone mineralization is normal. Mild forefoot soft tissue swelling. IMPRESSION: 1. Well corticated, chronic appearing bony irregularity around the first IP joint, which could be related to old trauma or prior infection. No definite acute bony destructive changes to suggest osteomyelitis. Consider further evaluation with MRI. Electronically Signed   By: Titus Dubin M.D.   On: 12/16/2017 15:50    Assessment: Infected diabetic ulcer, right foot  Plan: I recommended surgical debridement of the ulceration of his right foot.  Cultures will be obtained.  The benefits and risk of the procedure were explained.  The patient agrees with this course of care.  Order placed to obtain informed consent.  N.P.O. after midnight.  Surgery is planned for tomorrow morning at 7:30 AM.  Brita Romp 12/17/2017, 12:16 PM

## 2017-12-17 NOTE — Progress Notes (Signed)
Inpatient Diabetes Program Recommendations  AACE/ADA: New Consensus Statement on Inpatient Glycemic Control (2015)  Target Ranges:  Prepandial:   less than 140 mg/dL      Peak postprandial:   less than 180 mg/dL (1-2 hours)      Critically ill patients:  140 - 180 mg/dL   Lab Results  Component Value Date   GLUCAP 306 (H) 12/17/2017   HGBA1C 12.2 (H) 07/15/2017    Referral received.  Spoke with patient regarding his DM management.  Pt states that he takes 50 units of 70/30 bid and checks his blood sugar twice a day.  Discussed basic DM information.  Pt drinks sweetened drinks throughout the day at work.  Discussed importance of eliminating those.  Also, discussed importance of finding a PCP.  Encouraged pt to make those contacts while in the hospital. Pt has a new job and expressed concern about missing work for MD visits. Will follow.    [place], CDE. M.Ed. Pager [phone] Inpatient Diabetes Coordinator

## 2017-12-18 ENCOUNTER — Inpatient Hospital Stay (HOSPITAL_COMMUNITY): Payer: 59 | Admitting: Anesthesiology

## 2017-12-18 ENCOUNTER — Encounter (HOSPITAL_COMMUNITY)
Admission: EM | Disposition: A | Discharge status: Home / Self Care | Payer: Self-pay | Source: Home / Self Care | Attending: Internal Medicine

## 2017-12-18 DIAGNOSIS — Z720 Tobacco use: Secondary | ICD-10-CM

## 2017-12-18 DIAGNOSIS — R739 Hyperglycemia, unspecified: Secondary | ICD-10-CM

## 2017-12-18 DIAGNOSIS — L03115 Cellulitis of right lower limb: Secondary | ICD-10-CM

## 2017-12-18 DIAGNOSIS — L089 Local infection of the skin and subcutaneous tissue, unspecified: Secondary | ICD-10-CM

## 2017-12-18 DIAGNOSIS — E11628 Type 2 diabetes mellitus with other skin complications: Secondary | ICD-10-CM

## 2017-12-18 DIAGNOSIS — E1165 Type 2 diabetes mellitus with hyperglycemia: Secondary | ICD-10-CM

## 2017-12-18 DIAGNOSIS — A419 Sepsis, unspecified organism: Secondary | ICD-10-CM

## 2017-12-18 DIAGNOSIS — Z6839 Body mass index (BMI) 39.0-39.9, adult: Secondary | ICD-10-CM

## 2017-12-18 HISTORY — PX: INCISION AND DRAINAGE OF WOUND: SHX1803

## 2017-12-18 LAB — CBC WITH DIFFERENTIAL/PLATELET
BASOS PCT: 0 %
Basophils Absolute: 0 10*3/uL (ref 0.0–0.1)
EOS ABS: 0.2 10*3/uL (ref 0.0–0.7)
Eosinophils Relative: 2 %
HEMATOCRIT: 41.8 % (ref 39.0–52.0)
HEMOGLOBIN: 13.5 g/dL (ref 13.0–17.0)
LYMPHS ABS: 2.5 10*3/uL (ref 0.7–4.0)
Lymphocytes Relative: 25 %
MCH: 27.1 pg (ref 26.0–34.0)
MCHC: 32.3 g/dL (ref 30.0–36.0)
MCV: 83.9 fL (ref 78.0–100.0)
MONOS PCT: 9 %
Monocytes Absolute: 0.9 10*3/uL (ref 0.1–1.0)
NEUTROS ABS: 6.5 10*3/uL (ref 1.7–7.7)
NEUTROS PCT: 64 %
Platelets: 236 10*3/uL (ref 150–400)
RBC: 4.98 MIL/uL (ref 4.22–5.81)
RDW: 15.2 % (ref 11.5–15.5)
WBC: 10.1 10*3/uL (ref 4.0–10.5)

## 2017-12-18 LAB — SURGICAL PCR SCREEN
MRSA, PCR: NEGATIVE
Staphylococcus aureus: POSITIVE — AB

## 2017-12-18 LAB — BASIC METABOLIC PANEL WITH GFR
Anion gap: 8 (ref 5–15)
BUN: 9 mg/dL (ref 6–20)
CO2: 26 mmol/L (ref 22–32)
Calcium: 8 mg/dL — ABNORMAL LOW (ref 8.9–10.3)
Chloride: 103 mmol/L (ref 101–111)
Creatinine, Ser: 0.43 mg/dL — ABNORMAL LOW (ref 0.61–1.24)
GFR calc Af Amer: >60 mL/min
GFR calc non Af Amer: >60 mL/min
Glucose, Bld: 308 mg/dL — ABNORMAL HIGH (ref 65–99)
Potassium: 3.5 mmol/L (ref 3.5–5.1)
Sodium: 137 mmol/L (ref 135–145)

## 2017-12-18 LAB — GLUCOSE, CAPILLARY
GLUCOSE-CAPILLARY: 233 mg/dL — AB (ref 65–99)
Glucose-Capillary: 305 mg/dL — ABNORMAL HIGH (ref 65–99)
Glucose-Capillary: 297 mg/dL — ABNORMAL HIGH (ref 65–99)
Glucose-Capillary: 265 mg/dL — ABNORMAL HIGH (ref 65–99)
Glucose-Capillary: 236 mg/dL — ABNORMAL HIGH (ref 65–99)
Glucose-Capillary: 273 mg/dL — ABNORMAL HIGH (ref 65–99)

## 2017-12-18 SURGERY — IRRIGATION AND DEBRIDEMENT WOUND
Anesthesia: Monitor Anesthesia Care | Site: Foot | Laterality: Right

## 2017-12-18 MED ORDER — MUPIROCIN 2 % EX OINT
1.0000 "application " | TOPICAL_OINTMENT | Freq: Two times a day (BID) | CUTANEOUS | Status: DC
  Administered 2017-12-19: 1 via NASAL

## 2017-12-18 MED ORDER — INSULIN ASPART 100 UNIT/ML ~~LOC~~ SOLN
10.0000 [IU] | Freq: Once | SUBCUTANEOUS | Status: AC
  Administered 2017-12-18: 100 [IU] via SUBCUTANEOUS
  Filled 2017-12-18: qty 0.1

## 2017-12-18 MED ORDER — MIDAZOLAM HCL 5 MG/5ML IJ SOLN
INTRAMUSCULAR | Status: DC | PRN
  Administered 2017-12-18: 2 mg via INTRAVENOUS

## 2017-12-18 MED ORDER — SODIUM CHLORIDE 0.9 % IR SOLN
Status: DC | PRN
  Administered 2017-12-18: 3000 mL

## 2017-12-18 MED ORDER — MUPIROCIN 2 % EX OINT
1.0000 "application " | TOPICAL_OINTMENT | Freq: Two times a day (BID) | CUTANEOUS | Status: AC
  Administered 2017-12-18 (×2): 1 via NASAL
  Filled 2017-12-18: qty 22

## 2017-12-18 MED ORDER — MIDAZOLAM HCL 2 MG/2ML IJ SOLN
INTRAMUSCULAR | Status: AC
  Filled 2017-12-18: qty 2

## 2017-12-18 MED ORDER — 0.9 % SODIUM CHLORIDE (POUR BTL) OPTIME
TOPICAL | Status: DC | PRN
  Administered 2017-12-18: 1000 mL

## 2017-12-18 MED ORDER — LACTATED RINGERS IV SOLN
INTRAVENOUS | Status: DC
  Administered 2017-12-19: 03:00:00 via INTRAVENOUS

## 2017-12-18 MED ORDER — FENTANYL CITRATE (PF) 100 MCG/2ML IJ SOLN
INTRAMUSCULAR | Status: AC
  Filled 2017-12-18: qty 2

## 2017-12-18 MED ORDER — CHLORHEXIDINE GLUCONATE CLOTH 2 % EX PADS
6.0000 | MEDICATED_PAD | Freq: Every day | CUTANEOUS | Status: DC
  Administered 2017-12-18 – 2017-12-20 (×3): 6 via TOPICAL

## 2017-12-18 MED ORDER — INSULIN ASPART PROT & ASPART (70-30 MIX) 100 UNIT/ML ~~LOC~~ SUSP
60.0000 [IU] | Freq: Two times a day (BID) | SUBCUTANEOUS | Status: DC
  Administered 2017-12-19: 60 [IU] via SUBCUTANEOUS
  Filled 2017-12-18: qty 10

## 2017-12-18 MED ORDER — PROPOFOL 500 MG/50ML IV EMUL
INTRAVENOUS | Status: DC | PRN
  Administered 2017-12-18: 50 ug/kg/min via INTRAVENOUS

## 2017-12-18 MED ORDER — FENTANYL CITRATE (PF) 100 MCG/2ML IJ SOLN
INTRAMUSCULAR | Status: DC | PRN
  Administered 2017-12-18: 50 ug via INTRAVENOUS

## 2017-12-18 MED ORDER — LACTATED RINGERS IV SOLN
INTRAVENOUS | Status: DC | PRN
  Administered 2017-12-18: 07:00:00 via INTRAVENOUS

## 2017-12-18 MED ORDER — FENTANYL CITRATE (PF) 100 MCG/2ML IJ SOLN: 25.0000 ug | INTRAMUSCULAR | Status: DC | PRN

## 2017-12-18 MED ORDER — INSULIN ASPART 100 UNIT/ML ~~LOC~~ SOLN
11.0000 [IU] | Freq: Once | SUBCUTANEOUS | Status: AC
  Administered 2017-12-18: 11 [IU] via SUBCUTANEOUS
  Filled 2017-12-18: qty 0.11

## 2017-12-18 MED ORDER — BUPIVACAINE HCL (PF) 0.5 % IJ SOLN
INTRAMUSCULAR | Status: DC | PRN
  Administered 2017-12-18: 20 mL

## 2017-12-18 MED ORDER — BUPIVACAINE HCL (PF) 0.5 % IJ SOLN
INTRAMUSCULAR | Status: AC
  Filled 2017-12-18: qty 30

## 2017-12-18 MED ORDER — PROPOFOL 10 MG/ML IV BOLUS
INTRAVENOUS | Status: AC
  Filled 2017-12-18: qty 40

## 2017-12-18 MED ORDER — HYDROCODONE-ACETAMINOPHEN 7.5-325 MG PO TABS: 1.0000 | ORAL_TABLET | Freq: Once | ORAL | Status: DC | PRN

## 2017-12-18 SURGICAL SUPPLY — 46 items
APL SKNCLS STERI-STRIP NONHPOA (GAUZE/BANDAGES/DRESSINGS) ×1
BANDAGE ELASTIC 4 LF NS (GAUZE/BANDAGES/DRESSINGS) ×2 IMPLANT
BANDAGE ESMARK 4X12 BL STRL LF (DISPOSABLE) ×1 IMPLANT
BENZOIN TINCTURE PRP APPL 2/3 (GAUZE/BANDAGES/DRESSINGS) ×2 IMPLANT
BLADE SURG 15 STRL LF DISP TIS (BLADE) ×2 IMPLANT
BLADE SURG 15 STRL SS (BLADE) ×4
BNDG CMPR 12X4 ELC STRL LF (DISPOSABLE) ×1
BNDG CMPR MED 5X4 ELC HKLP NS (GAUZE/BANDAGES/DRESSINGS) ×1
BNDG CONFORM 2 STRL LF (GAUZE/BANDAGES/DRESSINGS) ×1 IMPLANT
BNDG ESMARK 4X12 BLUE STRL LF (DISPOSABLE) ×2
BNDG GAUZE ELAST 4 BULKY (GAUZE/BANDAGES/DRESSINGS) ×2 IMPLANT
CLOTH BEACON ORANGE TIMEOUT ST (SAFETY) ×2 IMPLANT
COVER LIGHT HANDLE STERIS (MISCELLANEOUS) ×4 IMPLANT
CUFF TOURNIQUET SINGLE 18IN (TOURNIQUET CUFF) IMPLANT
DECANTER SPIKE VIAL GLASS SM (MISCELLANEOUS) ×2 IMPLANT
DRAPE OEC MINIVIEW 54X84 (DRAPES) ×2 IMPLANT
DRSG ADAPTIC 3X8 NADH LF (GAUZE/BANDAGES/DRESSINGS) ×2 IMPLANT
ELECT REM PT RETURN 9FT ADLT (ELECTROSURGICAL) ×2
ELECTRODE REM PT RTRN 9FT ADLT (ELECTROSURGICAL) ×1 IMPLANT
GAUZE SPONGE 4X4 12PLY STRL (GAUZE/BANDAGES/DRESSINGS) ×2 IMPLANT
GLOVE BIO SURGEON STRL SZ7.5 (GLOVE) ×2 IMPLANT
GLOVE BIOGEL M 6.5 STRL (GLOVE) ×1 IMPLANT
GLOVE BIOGEL PI IND STRL 7.0 (GLOVE) ×2 IMPLANT
GLOVE BIOGEL PI INDICATOR 7.0 (GLOVE) ×2
GLOVE ECLIPSE 6.5 STRL STRAW (GLOVE) ×1 IMPLANT
GLOVE ECLIPSE 7.0 STRL STRAW (GLOVE) ×2 IMPLANT
GOWN STRL REUS W/TWL LRG LVL3 (GOWN DISPOSABLE) ×4 IMPLANT
HANDPIECE INTERPULSE COAX TIP (DISPOSABLE) ×2
IV NS IRRIG 3000ML ARTHROMATIC (IV SOLUTION) ×1 IMPLANT
KIT TURNOVER CYSTO (KITS) ×2 IMPLANT
MANIFOLD NEPTUNE II (INSTRUMENTS) ×2 IMPLANT
NDL HYPO 27GX1-1/4 (NEEDLE) ×2 IMPLANT
NEEDLE HYPO 27GX1-1/4 (NEEDLE) ×4 IMPLANT
NS IRRIG 1000ML POUR BTL (IV SOLUTION) ×2 IMPLANT
PACK BASIC LIMB (CUSTOM PROCEDURE TRAY) ×2 IMPLANT
PAD ARMBOARD 7.5X6 YLW CONV (MISCELLANEOUS) ×2 IMPLANT
SET BASIN LINEN APH (SET/KITS/TRAYS/PACK) ×2 IMPLANT
SET HNDPC FAN SPRY TIP SCT (DISPOSABLE) ×1 IMPLANT
STRIP CLOSURE SKIN 1/2X4 (GAUZE/BANDAGES/DRESSINGS) ×4 IMPLANT
SUT VIC AB 2-0 CT2 27 (SUTURE) ×2 IMPLANT
SUT VIC AB 4-0 PS2 27 (SUTURE) ×2 IMPLANT
SUT VICRYL AB 3-0 FS1 BRD 27IN (SUTURE) ×2 IMPLANT
SWAB CULTURE LIQ STUART DBL (MISCELLANEOUS) ×2 IMPLANT
SYR BULB IRRIGATION 50ML (SYRINGE) ×2 IMPLANT
SYR CONTROL 10ML LL (SYRINGE) ×4 IMPLANT
TUBE ANAEROBIC PORT A CUL  W/M (MISCELLANEOUS) ×1 IMPLANT

## 2017-12-18 NOTE — Progress Notes (Signed)
HISTORY AND PHYSICAL INTERVAL NOTE:  12/18/2017  7:25 AM  Zachary Schneider  has presented today for surgery, with the diagnosis of infected diabetic right foot ulcer, diabetes mellitus.  The various methods of treatment have been discussed with the patient.  No guarantees were given.  After consideration of risks, benefits and other options for treatment, the patient has consented to surgery.  I have reviewed the patients' chart and labs.    Patient Vitals for the past 24 hrs:  BP Temp Temp src Pulse Resp SpO2  12/18/17 0713 126/76 - - - - -  12/18/17 0706 - 98.3 F (36.8 C) Oral 79 16 94 %  12/18/17 0552 127/86 98.3 F (36.8 C) Oral 73 18 97 %  12/17/17 2102 114/77 98.3 F (36.8 C) Oral 72 18 94 %  12/17/17 2046 - - - - - 97 %  12/17/17 1450 116/73 97.8 F (36.6 C) Oral 76 20 98 %    A history and physical examination was performed in my office.  The patient was reexamined.  There have been no changes to this history and physical examination.  Marcheta Grammes, DPM

## 2017-12-18 NOTE — Transfer of Care (Signed)
Immediate Anesthesia Transfer of Care Note  Patient: Zachary Schneider  Procedure(s) Performed: DEBRIDEMENT OF INFECTED DIABETIC FOOT ULCER RIGHT FOOT (Right Foot)  Patient Location: PACU  Anesthesia Type:MAC  Level of Consciousness: awake, alert  and patient cooperative  Airway & Oxygen Therapy: Patient Spontanous Breathing and Patient connected to nasal cannula oxygen  Post-op Assessment: Report given to RN, Post -op Vital signs reviewed and stable and Patient moving all extremities  Post vital signs: Reviewed and stable  Last Vitals:  Vitals Value Taken Time  BP    Temp    Pulse 77 12/18/2017  8:19 AM  Resp    SpO2 96 % 12/18/2017  8:19 AM  Vitals shown include unvalidated device data.  Last Pain:  Vitals:   12/18/17 0706  TempSrc: Oral  PainSc: 0-No pain         Complications: No apparent anesthesia complications

## 2017-12-18 NOTE — Brief Op Note (Signed)
BRIEF OPERATIVE NOTE  DATE OF PROCEDURE 12/18/2017  SURGEON Marcheta Grammes, DPM  ASSISTANT SURGEON Jilda Panda, DPM  OR STAFF Circulator: Glory Rosebush, RN Scrub Person: Meade Maw Circulator Assistant: Edrick Kins, RN   PREOPERATIVE DIAGNOSIS 1.  Infected diabetic ulcer, right foot 2.  Diabetes mellitus  POSTOPERATIVE DIAGNOSIS Same  PROCEDURE Excision debridement of infected diabetic ulcer, right foot  ANESTHESIA Monitor Anesthesia Care   HEMOSTASIS Pneumatic ankle tourniquet set at 250 mmHg  ESTIMATED BLOOD LOSS Minimal (<5 cc)  MATERIALS USED None  INJECTABLES 0.5% Marcaine plain  PATHOLOGY Aerobic culture Anaerobic culture  COMPLICATIONS None

## 2017-12-18 NOTE — Progress Notes (Signed)
PROGRESS NOTE  Zachary Schneider:[id number] DOB: 1971/03/02 DOA: 12/16/2017 PCP: Default, Provider, MD  Brief History:  47 y.o.malewith medical history significant oftesticular cancer, type 2 diabetes, morbid obesity, tobacco use who is coming to the emergency department due to his chronic right foot callus becoming ulcerated, tender with a malodorous discharge.  Per patient, he has required debridement in the past of hiscalluses, but he has never been this severe. Over the weekend, he noticed that this was bothering a little bit more than usual, but he was able to get up and put up some shoes this morning. He made an appointment to see podiatry tomorrow. However around noontime today, he noticed he developed intense pain in the area. He subsequently took his shoe and noticed that there was a lot more erythema, edema and foul-smelling discharge than earlier. So he decided not to wait until he sees podiatry 6/18 and came to the emergency department.   He mentions that his blood glucoses have been in the 200 range, but over the last few days they have been higher than usual. He denies fever, chills, sore throat, dyspnea, chest pain, palpitations, dizziness, orthopnea, recent pitting edema lower extremities, abdominal pain, nausea, emesis, diarrhea, melena, constipation, hematochezia, dysuria, frequency, hematuria or oliguria. Denies polyuria, polydipsia or polyphagia. No heat or cold intolerance  Assessment/Plan: Diabetic foot infection -Continue vancomycin and Zosyn pending culture data -Appreciate podiatry -ABIs--normal -12/18/2017--excisional debridement of diabetic foot ulcers -12/17/2017 MRI foot negative for osteomyelitis or drainable fluid collection  Diabetes mellitus type 2, uncontrolled with hyperglycemia -12/17/2017 hemoglobin A1c 13.1 -Increase 70/30 insulin to 60 units bid -Patient has been seen by nutrition and diabetic educator  Sepsis -Secondary to  diabetic foot infection -Sepsis physiology improved -Continue IV antibiotics pending culture data -Lactic acid 1.2  Morbid obesity -BMI 39.20 -Lifestyle modification  Tobacco abuse -I have discussed tobacco cessation with the patient.  I have counseled the patient regarding the negative impacts of continued tobacco use including but not limited to lung cancer, COPD, and cardiovascular disease.  I have discussed alternatives to tobacco and modalities that may help facilitate tobacco cessation including but not limited to biofeedback, hypnosis, and medications.  Total time spent with tobacco counseling was 4 minutes.    Disposition Plan:   Home in 1-2 days  Family Communication:   Aunt updated at bedside 6/19--Total time spent 35 minutes.  Greater than 50% spent face to face counseling and coordinating care.   Consultants:  podiatry  Code Status:  FULL DVT Prophylaxis:  [place] Heparin    Procedures: As Listed in Progress Note Above  Antibiotics: vanco 6/17>>> Zosyn 6/18>>>    Subjective: Patient denies fevers, chills, headache, chest pain, dyspnea, nausea, vomiting, diarrhea, abdominal pain, dysuria, hematuria, hematochezia, and melena.   Objective: Vitals:   12/18/17 0900 12/18/17 0928 12/18/17 1030 12/18/17 1613  BP: 120/74 107/65 120/75 (!) 143/94  Pulse: 68 69 67 74  Resp: 15 16 16 18   Temp:  97.7 F (36.5 C) 97.9 F (36.6 C)   TempSrc:  Oral    SpO2: 96% 99% 99% 95%  Weight:      Height:        Intake/Output Summary (Last 24 hours) at 12/18/2017 1805 Last data filed at 12/18/2017 1716 Gross per 24 hour  Intake 3597.71 ml  Output -  Net 3597.71 ml   Weight change:  Exam:   General:  Pt is alert, follows commands appropriately, not in  acute distress  HEENT: No icterus, No thrush, No neck mass, [place]/AT  Cardiovascular: RRR, S1/S2, no rubs, no gallops  Respiratory: CTA bilaterally, no wheezing, no crackles, no rhonchi  Abdomen: Soft/+BS, non tender, non  distended, no guarding  Extremities: No edema, No lymphangitis, No petechiae, No rashes, no synovitis; bulky dressing on right foot   Data Reviewed: I have personally reviewed following labs and imaging studies Basic Metabolic Panel: Recent Labs  Lab 12/16/17 1827 12/16/17 1843 12/17/17 0505 12/18/17 0512  NA 131*  --  135 137  K 3.6  --  3.7 3.5  CL 95*  --  100* 103  CO2 22  --  26 26  GLUCOSE 328*  --  337* 308*  BUN 10  --  11 9  CREATININE 0.61  --  0.64 0.43*  CALCIUM 8.7*  --  8.0* 8.0*  MG  --  2.0  --   --   PHOS  --  3.6  --   --    Liver Function Tests: Recent Labs  Lab 12/16/17 1827  AST 11*  ALT 19  ALKPHOS 93  BILITOT 1.4*  PROT 7.7  ALBUMIN 3.6   No results for input(s): LIPASE, AMYLASE in the last 168 hours. No results for input(s): AMMONIA in the last 168 hours. Coagulation Profile: Recent Labs  Lab 12/16/17 1827  INR 0.96   CBC: Recent Labs  Lab 12/16/17 1827 12/17/17 0505 12/18/17 0512  WBC 15.6* 14.0* 10.1  NEUTROABS 12.2* 9.6* 6.5  HGB 15.9 13.7 13.5  HCT 47.4 42.4 41.8  MCV 82.3 84.3 83.9  PLT 220 243 236   Cardiac Enzymes: No results for input(s): CKTOTAL, CKMB, CKMBINDEX, TROPONINI in the last 168 hours. BNP: Invalid input(s): POCBNP CBG: Recent Labs  Lab 12/18/17 0707 12/18/17 0822 12/18/17 0911 12/18/17 1113 12/18/17 1649  GLUCAP 305* 297* 265* 236* 273*   HbA1C: Recent Labs    12/17/17 0505  HGBA1C 13.1*   Urine analysis:    Component Value Date/Time   COLORURINE YELLOW 12/16/2017 1900   APPEARANCEUR CLEAR 12/16/2017 1900   LABSPEC 1.036 (H) 12/16/2017 1900   PHURINE 6.0 12/16/2017 1900   GLUCOSEU >=500 (A) 12/16/2017 1900   HGBUR NEGATIVE 12/16/2017 1900   BILIRUBINUR NEGATIVE 12/16/2017 1900   KETONESUR 80 (A) 12/16/2017 1900   PROTEINUR NEGATIVE 12/16/2017 1900   NITRITE NEGATIVE 12/16/2017 1900   LEUKOCYTESUR NEGATIVE 12/16/2017 1900   Sepsis  Labs: @LABRCNTIP (procalcitonin:4,lacticidven:4) ) Recent Results (from the past 240 hour(s))  Culture, blood (Routine x 2)     Status: None (Preliminary result)   Collection Time: 12/16/17  6:32 PM  Result Value Ref Range Status   Specimen Description LEFT ANTECUBITAL  Final   Special Requests   Final    BOTTLES DRAWN AEROBIC AND ANAEROBIC Blood Culture adequate volume   Culture   Final    NO GROWTH 2 DAYS Performed at [facility], [address] [place], [place] [address]    Report Status PENDING  Incomplete  Culture, blood (Routine x 2)     Status: None (Preliminary result)   Collection Time: 12/16/17  6:39 PM  Result Value Ref Range Status   Specimen Description RIGHT ANTECUBITAL  Final   Special Requests   Final    BOTTLES DRAWN AEROBIC AND ANAEROBIC Blood Culture adequate volume   Culture   Final    NO GROWTH 2 DAYS Performed at [facility], [address], [place], [place] [address]    Report Status PENDING  Incomplete  MRSA PCR Screening     Status: None   Collection Time: 12/17/17  1:17 AM  Result Value Ref Range Status   MRSA by PCR NEGATIVE NEGATIVE Final    Comment:        The GeneXpert MRSA Assay (FDA approved for NASAL specimens only), is one component of a comprehensive MRSA colonization surveillance program. It is not intended to diagnose MRSA infection nor to guide or monitor treatment for MRSA infections. Performed at [facility], [address], [place], [place] [address]   Surgical pcr screen     Status: Abnormal   Collection Time: 12/17/17  9:12 PM  Result Value Ref Range Status   MRSA, PCR NEGATIVE NEGATIVE Final   Staphylococcus aureus POSITIVE (A) NEGATIVE Final    Comment: RESULT CALLED TO, READ BACK BY AND VERIFIED WITH: BREE,RN @ 0413 ON 6.19.19 BY BOWMAN,L (NOTE) The Xpert SA Assay (FDA approved for NASAL specimens in patients 26 years of age and older), is one component of a comprehensive surveillance program. It is not intended to  diagnose infection nor to guide or monitor treatment. Performed at [facility], [address] [place], [place] [address]   Anaerobic culture     Status: None (Preliminary result)   Collection Time: 12/18/17  7:59 AM  Result Value Ref Range Status   Specimen Description   Final    WOUND RIGHT FOOT ULCER Performed at [facility] [address], [place], [place] [address]    Special Requests   Final    RIGHT FOOT,ULCER Performed at [facility], [address], [place], [place] [address]    Gram Stain PENDING  Incomplete   Culture PENDING  Incomplete   Report Status PENDING  Incomplete  Aerobic Culture (superficial specimen)     Status: None (Preliminary result)   Collection Time: 12/18/17  7:59 AM  Result Value Ref Range Status   Specimen Description   Final    WOUND RIGHT FOOT ULCER Performed at [facility], [address] [place], [place] [address]    Special Requests   Final    RIGHT FOOT ULCER Performed at [facility], [address], [place], [place] [address]    Gram Stain   Final    RARE WBC PRESENT, PREDOMINANTLY PMN NO ORGANISMS SEEN Performed at [facility], [place] [address], [place],  [address]    Culture PENDING  Incomplete   Report Status PENDING  Incomplete     Scheduled Meds: . Chlorhexidine Gluconate Cloth  6 each Topical Daily  . heparin injection (subcutaneous)  5,000 Units Subcutaneous Q8H  . insulin aspart  0-20 Units Subcutaneous TID WC  . insulin aspart protamine- aspart  50 Units Subcutaneous BID WC  . multivitamin with minerals  1 tablet Oral Daily  . mupirocin ointment  1 application Nasal BID  . mupirocin ointment  1 application Nasal BID  . nutrition supplement (JUVEN)  1 packet Oral BID BM   Continuous Infusions: . lactated ringers 50 mL/hr at 12/18/17 1716  . piperacillin-tazobactam Stopped (12/18/17 1627)  . vancomycin Stopped (12/18/17 1716)    Procedures/Studies: Dg Chest 2 View  Result Date:  12/16/2017 CLINICAL DATA:  Right great toe ulcer. EXAM: CHEST - 2 VIEW COMPARISON:  Chest x-ray dated March 18, 2017. FINDINGS: The heart size and mediastinal contours are within normal limits. Both lungs are clear. The visualized skeletal structures are unremarkable. IMPRESSION: No active cardiopulmonary disease. Electronically Signed   By:  Titus Dubin M.D.   On: 12/16/2017 15:41   Mri Right Foot With And Without Contrast  Result Date: 12/17/2017 CLINICAL DATA:  Great toe ulcer.  Evaluate for osteomyelitis. EXAM: MRI OF THE RIGHT FOREFOOT WITHOUT AND WITH CONTRAST TECHNIQUE: Multiplanar, multisequence MR imaging of the right forefoot was performed before and after the administration of intravenous contrast. CONTRAST:  65mL MULTIHANCE GADOBENATE DIMEGLUMINE 529 MG/ML IV SOLN COMPARISON:  Right foot x-rays from yesterday. FINDINGS: Bones/Joint/Cartilage No marrow signal abnormality. No fracture or dislocation. Normal alignment. No joint effusion. Mild osteoarthritis of the first MTP joint. Irregularity of the first IP joint, unchanged. Ligaments Collateral ligaments are intact.  Lisfranc ligament is intact. Muscles and Tendons Flexor, peroneal and extensor compartment tendons are intact. Diffuse fatty atrophy of the intrinsic muscles of the forefoot. Soft tissue Small superficial plantar soft tissue ulceration at the level of the first proximal phalanx base with adjacent mild, enhancing soft tissue swelling involving the great toe. No fluid collection or hematoma. No soft tissue mass. Mild dorsal forefoot soft tissue swelling without enhancement. IMPRESSION: 1. Small superficial soft tissue ulceration along the plantar great toe with adjacent enhancing soft tissue swelling, consistent with cellulitis. No evidence of osteomyelitis or drainable fluid collection. 2. Chronic irregularity of the first IP joint again noted, likely related to prior trauma or old infection. Electronically Signed   By: Titus Dubin M.D.   On: 12/17/2017 08:43   US Arterial Abi (screening Lower Extremity)  Result Date: 12/17/2017 CLINICAL DATA:  Right big toe ulcer. EXAM: NONINVASIVE PHYSIOLOGIC VASCULAR STUDY OF BILATERAL LOWER EXTREMITIES TECHNIQUE: Evaluation of both lower extremities were performed at rest, including calculation of ankle-brachial indices with single level Doppler, pressure and pulse volume recording. COMPARISON:  None. FINDINGS: Right ABI:  1.07 Left ABI:  1.07 Right Lower Extremity:  Normal arterial waveforms at the ankle. Left Lower Extremity:  Normal arterial waveforms at the ankle. IMPRESSION: Normal resting ankle-brachial indices. Electronically Signed   By: Markus Daft M.D.   On: 12/17/2017 09:02   Dg Foot Complete Right  Result Date: 12/16/2017 CLINICAL DATA:  Right great toe ulcer. EXAM: RIGHT FOOT COMPLETE - 3+ VIEW COMPARISON:  Right foot x-ray report dated February 21, 2004. FINDINGS: No acute fracture or dislocation. Well corticated, chronic appearing irregularity of the first proximal phalanx head and base of the first distal phalanx. Mild degenerative changes of the first MTP joint. Joint spaces are preserved. Bone mineralization is normal. Mild forefoot soft tissue swelling. IMPRESSION: 1. Well corticated, chronic appearing bony irregularity around the first IP joint, which could be related to old trauma or prior infection. No definite acute bony destructive changes to suggest osteomyelitis. Consider further evaluation with MRI. Electronically Signed   By: Titus Dubin M.D.   On: 12/16/2017 15:50    Orson Eva, DO  [facility] Pager [phone]  If 7PM-7AM, please contact night-coverage [email or web address] Password [email or web address] 12/18/2017, 6:05 PM   LOS: 2 days

## 2017-12-18 NOTE — Op Note (Signed)
OPERATIVE NOTE  DATE OF PROCEDURE 12/18/2017  SURGEON Marcheta Grammes, DPM  ASSISTANT SURGEON Jilda Panda, DPM  OR STAFF Circulator: Glory Rosebush, RN Scrub Person: Meade Maw Circulator Assistant: Edrick Kins, RN   PREOPERATIVE DIAGNOSIS 1.  Infected diabetic ulcer, right foot 2.  Diabetes mellitus  POSTOPERATIVE DIAGNOSIS Same  PROCEDURE Excision debridement of infected diabetic ulcer, right foot  ANESTHESIA Monitor Anesthesia Care   HEMOSTASIS Pneumatic ankle tourniquet set at 250 mmHg  ESTIMATED BLOOD LOSS Minimal (<5 cc)  MATERIALS USED None  INJECTABLES 0.5% Marcaine plain  PATHOLOGY Aerobic culture Anaerobic culture  COMPLICATIONS None  DESCRIPTION OF THE PROCEDURE:  The patient was brought to the operating room and placed on the operative table in the supine position.  A pneumatic ankle tourniquet was applied to the operative extremity.  Following sedation, the surgical site was anesthetized with 0.5% Marcaine plain.  The foot was then prepped, scrubbed, and draped in the usual sterile technique.  The foot was elevated, exsanguinated and the pneumatic ankle tourniquet inflated to 250 mmHg.    Attention was directed to the right foot where a large hyperkeratotic lesion was identified plantar to the first metatarsal head.  Using a #15 blade, an excisional full thickness debridement was performed.  Skin and subcutaneous tissue was debrided down to a viable bleeding base.  Purulence was encountered.  Following debridement, the ulceration measured 5.7 by 5.0 by 0.3 cm.  Aerobic and anaerobic cultures wer obtained.  The wound was irrigated with 3 L of sterile saline using pulse lavage.  A Betadine dressing applied to the right foot.  The pneumatic ankle tourniquet was deflated and a prompt hyperemic response was noted to all digits of the right foot.   The patient tolerated the procedure well.  The patient was then transferred to PACU with  vital signs stable and vascular status intact to all toes of the operative foot.

## 2017-12-18 NOTE — Progress Notes (Signed)
Inpatient Diabetes Program Recommendations  AACE/ADA: New Consensus Statement on Inpatient Glycemic Control (2019)  Target Ranges:  Prepandial:   less than 140 mg/dL      Peak postprandial:   less than 180 mg/dL (1-2 hours)      Critically ill patients:  140 - 180 mg/dL  Results for KASH, DAVIE (MRN [id number]) as of 12/18/2017 07:20  Ref. Range 12/17/2017 05:05 12/18/2017 05:12  Glucose Latest Ref Range: 65 - 99 mg/dL  308 (H)  Hemoglobin A1C Latest Ref Range: 4.8 - 5.6 % 13.1 (H)    Results for JED, KUTCH (MRN [id number]) as of 12/18/2017 07:20  Ref. Range 12/17/2017 08:48 12/17/2017 11:44 12/17/2017 16:20 12/17/2017 21:03  Glucose-Capillary Latest Ref Range: 65 - 99 mg/dL 292 (H) 306 (H) 322 (H) 271 (H)   Review of Glycemic Control  Diabetes history: DM2 Outpatient Diabetes medications: 70/30 50 units BID Current orders for Inpatient glycemic control: 70/30 50 units BID, Novolog 0-20 units TID with meals  Inpatient Diabetes Program Recommendations: Insulin - Basal: Please consider increasing 70/30 to 60 units BID.  Correction (SSI): Please consider ordering Novolog 0-5 units QHS for bedtime correction. HgbA1C: A1C 13.1% on 12/17/17 indicating an average glucose of 329 mg/dl over the past 2-3 months.  Thanks, Barnie Alderman, RN, MSN, CDE Diabetes Coordinator Inpatient Diabetes Program [phone] (Team Pager from 8am to 5pm)

## 2017-12-18 NOTE — Anesthesia Postprocedure Evaluation (Signed)
Anesthesia Post Note  Late Entry for 0848  Patient: Zachary Schneider  Procedure(s) Performed: DEBRIDEMENT OF INFECTED DIABETIC FOOT ULCER RIGHT FOOT (Right Foot)  Patient location during evaluation: PACU Anesthesia Type: MAC Level of consciousness: awake and alert and oriented Pain management: pain level controlled Vital Signs Assessment: post-procedure vital signs reviewed and stable Respiratory status: spontaneous breathing Cardiovascular status: stable Postop Assessment: no apparent nausea or vomiting Anesthetic complications: no     Last Vitals:  Vitals:   12/18/17 0900 12/18/17 0928  BP: 120/74 107/65  Pulse: 68 69  Resp: 15 16  Temp:  36.5 C  SpO2: 96% 99%    Last Pain:  Vitals:   12/18/17 0928  TempSrc: Oral  PainSc: 0-No pain                 Dari Carpenito A

## 2017-12-18 NOTE — Anesthesia Preprocedure Evaluation (Signed)
Anesthesia Evaluation  Patient identified by MRN, date of birth, ID band Patient awake    Reviewed: Allergy & Precautions, NPO status , Patient's Chart, lab work & pertinent test results  Airway Mallampati: II  TM Distance: >3 FB Neck ROM: Full    Dental no notable dental hx.    Pulmonary neg pulmonary ROS, former smoker,  Ex smoker x 2 weeks    Pulmonary exam normal breath sounds clear to auscultation       Cardiovascular Exercise Tolerance: Good negative cardio ROS Normal cardiovascular examI Rhythm:Regular Rate:Normal     Neuro/Psych negative neurological ROS  negative psych ROS   GI/Hepatic negative GI ROS, Neg liver ROS,   Endo/Other  negative endocrine ROSdiabetes, Poorly Controlled, Type 1, Insulin Dependent  Renal/GU negative Renal ROS  negative genitourinary   Musculoskeletal negative musculoskeletal ROS (+)   Abdominal   Peds negative pediatric ROS (+)  Hematology negative hematology ROS (+)   Anesthesia Other Findings   Reproductive/Obstetrics negative OB ROS                             Anesthesia Physical Anesthesia Plan  ASA: II  Anesthesia Plan: MAC   Post-op Pain Management:    Induction: Intravenous  PONV Risk Score and Plan:   Airway Management Planned: Simple Face Mask  Additional Equipment:   Intra-op Plan:   Post-operative Plan:   Informed Consent: I have reviewed the patients History and Physical, chart, labs and discussed the procedure including the risks, benefits and alternatives for the proposed anesthesia with the patient or authorized representative who has indicated his/her understanding and acceptance.     Plan Discussed with: CRNA  Anesthesia Plan Comments:         Anesthesia Quick Evaluation

## 2017-12-19 ENCOUNTER — Encounter (HOSPITAL_COMMUNITY): Payer: Self-pay | Admitting: Podiatry

## 2017-12-19 DIAGNOSIS — L03115 Cellulitis of right lower limb: Secondary | ICD-10-CM

## 2017-12-19 LAB — GLUCOSE, CAPILLARY
GLUCOSE-CAPILLARY: 232 mg/dL — AB (ref 65–99)
GLUCOSE-CAPILLARY: 220 mg/dL — AB (ref 65–99)
Glucose-Capillary: 171 mg/dL — ABNORMAL HIGH (ref 65–99)
Glucose-Capillary: 210 mg/dL — ABNORMAL HIGH (ref 65–99)

## 2017-12-19 LAB — BASIC METABOLIC PANEL
Anion gap: 5 (ref 5–15)
BUN: 8 mg/dL (ref 6–20)
CO2: 29 mmol/L (ref 22–32)
CREATININE: 0.53 mg/dL — AB (ref 0.61–1.24)
Calcium: 7.9 mg/dL — ABNORMAL LOW (ref 8.9–10.3)
Chloride: 102 mmol/L (ref 101–111)
Glucose, Bld: 238 mg/dL — ABNORMAL HIGH (ref 65–99)
Potassium: 3.3 mmol/L — ABNORMAL LOW (ref 3.5–5.1)
Sodium: 136 mmol/L (ref 135–145)

## 2017-12-19 LAB — CBC WITH DIFFERENTIAL/PLATELET
BASOS ABS: 0 10*3/uL (ref 0.0–0.1)
Basophils Relative: 0 %
EOS ABS: 0.2 10*3/uL (ref 0.0–0.7)
Eosinophils Relative: 2 %
HCT: 38.5 % — ABNORMAL LOW (ref 39.0–52.0)
Hemoglobin: 12.3 g/dL — ABNORMAL LOW (ref 13.0–17.0)
LYMPHS ABS: 2.8 10*3/uL (ref 0.7–4.0)
Lymphocytes Relative: 30 %
MCH: 26.8 pg (ref 26.0–34.0)
MCHC: 31.9 g/dL (ref 30.0–36.0)
MCV: 83.9 fL (ref 78.0–100.0)
MONO ABS: 0.9 10*3/uL (ref 0.1–1.0)
Monocytes Relative: 10 %
Neutro Abs: 5.3 10*3/uL (ref 1.7–7.7)
Neutrophils Relative %: 58 %
PLATELETS: 242 10*3/uL (ref 150–400)
RBC: 4.59 MIL/uL (ref 4.22–5.81)
RDW: 15.1 % (ref 11.5–15.5)
WBC Morphology: INCREASED
WBC: 9.2 10*3/uL (ref 4.0–10.5)

## 2017-12-19 LAB — VANCOMYCIN, TROUGH: VANCOMYCIN TR: 11 ug/mL — AB (ref 15–20)

## 2017-12-19 MED ORDER — INSULIN ASPART PROT & ASPART (70-30 MIX) 100 UNIT/ML ~~LOC~~ SUSP
70.0000 [IU] | Freq: Two times a day (BID) | SUBCUTANEOUS | Status: DC
  Administered 2017-12-19 – 2017-12-20 (×2): 70 [IU] via SUBCUTANEOUS
  Filled 2017-12-19: qty 10

## 2017-12-19 MED ORDER — CIPROFLOXACIN HCL 250 MG PO TABS
500.0000 mg | ORAL_TABLET | Freq: Two times a day (BID) | ORAL | Status: DC
  Administered 2017-12-19 – 2017-12-20 (×2): 500 mg via ORAL
  Filled 2017-12-19 (×2): qty 2

## 2017-12-19 MED ORDER — SILVER SULFADIAZINE 1 % EX CREA: 1.0000 "application " | TOPICAL_CREAM | Freq: Every day | CUTANEOUS | 0 refills | Status: DC

## 2017-12-19 MED ORDER — POTASSIUM CHLORIDE CRYS ER 20 MEQ PO TBCR
40.0000 meq | EXTENDED_RELEASE_TABLET | Freq: Once | ORAL | Status: AC
  Administered 2017-12-19: 40 meq via ORAL
  Filled 2017-12-19: qty 2

## 2017-12-19 MED ORDER — CLINDAMYCIN HCL 150 MG PO CAPS
300.0000 mg | ORAL_CAPSULE | Freq: Four times a day (QID) | ORAL | Status: DC
  Administered 2017-12-20: 300 mg via ORAL
  Filled 2017-12-19: qty 2

## 2017-12-19 NOTE — Discharge Summary (Signed)
Physician Discharge Summary  Zachary Schneider LGX:[id number] DOB: Oct 14, 1970 DOA: 12/16/2017  PCP: Default, Provider, MD  Admit date: 12/16/2017 Discharge date:12/20/2017  Admitted From: Home Disposition:  Home   Recommendations for Outpatient Follow-up:  1. Follow up with PCP in 1-2 weeks 2. Please obtain BMP/CBC in one week 3. Follow podiatry 12/24/17 @ 2pm    Discharge Condition: Stable CODE STATUS: FULL Diet recommendation: Heart Healthy / Carb Modified  Brief/Interim Summary: 47 y.o.malewith medical history significant oftesticular cancer, type 2 diabetes, morbid obesity, tobacco use who is coming to the emergency department due to his chronic right foot callus becoming ulcerated, tender with a malodorous discharge.  Per patient, he has required debridement in the past of hiscalluses, but he has never been this severe. Over the weekend, he noticed that this was bothering a little bit more than usual, but he was able to get up and put up some shoes this morning. He made an appointment to see podiatry tomorrow. However around noontime today, he noticed he developed intense pain in the area. He subsequently took his shoe and noticed that there was a lot more erythema, edema and foul-smelling discharge than earlier. So he decided not to wait until he sees podiatry 6/18and came to the emergency department.  He mentions that his blood glucoses have been in the 200 range, but over the last few days they have been higher than usual. He denies fever, chills, sore throat, dyspnea, chest pain, palpitations, dizziness, orthopnea, recent pitting edema lower extremities, abdominal pain, nausea, emesis, diarrhea, melena, constipation, hematochezia, dysuria, frequency, hematuria or oliguria. Denies polyuria, polydipsia or polyphagia. No heat or cold intolerance. Podiatry was consulted to assist with management.  The patient was taken to surgery where his diabetic foot infection was  debrided.  The patient was transitioned to oral antibiotics.  He remained clinically stable.  The patient's insulin dosing was adjusted to improve his glycemic control.  He will be discharged home with 70/30 insulin 75 units twice daily.  Metformin 500 mg daily was added.    Discharge Diagnoses:  Diabetic foot infection -Continued vancomycin and Zosyn pending culture data -d/c home with clindamycin and cipro per podiatry -Appreciate podiatry -ABIs--normal -12/18/2017--excisional debridement of diabetic foot ulcers -12/17/2017 MRI foot negative for osteomyelitis or drainable fluid collection -change dressing daily with silvadene after discharge  Diabetes mellitus type 2, uncontrolled with hyperglycemia -12/17/2017 hemoglobin A1c 13.1 -Increase 70/30 insulinto 75 units bid -Patient has been seen by nutrition and diabetic educator -pt endorses dietary indiscreation -add metformin after d/c   Sepsis -Secondary to diabetic foot infection -Sepsis physiology improved -Continued IV antibiotics pending culture data -d/c home with clinda and cipro per podiatry -Lactic acid 1.2  Morbid obesity -BMI 39.20 -Lifestyle modification  Tobacco abuse -I have discussed tobacco cessation with the patient. I have counseled the patient regarding the negative impacts of continued tobacco use including but not limited to lung cancer, COPD, and cardiovascular disease. I have discussed alternatives to tobacco and modalities that may help facilitate tobacco cessation including but not limited to biofeedback, hypnosis, and medications. Total time spent with tobacco counseling was 4 minutes.  Hypokalemia -repleted -check mag--1.9      Discharge Instructions   Allergies as of 12/20/2017      Reactions   Bee Venom Swelling   Mushroom Extract Complex Nausea And Vomiting      Medication List    STOP taking these medications   insulin NPH-regular Human (70-30) 100 UNIT/ML  injection Commonly known as:  NOVOLIN 70/30 Replaced by:  insulin aspart protamine- aspart (70-30) 100 UNIT/ML injection     TAKE these medications   ciprofloxacin 500 MG tablet Commonly known as:  CIPRO Take 1 tablet (500 mg total) by mouth 2 (two) times daily.   clindamycin 300 MG capsule Commonly known as:  CLEOCIN Take 1 capsule (300 mg total) by mouth every 6 (six) hours.   insulin aspart protamine- aspart (70-30) 100 UNIT/ML injection Commonly known as:  NOVOLOG MIX 70/30 Inject 0.75 mLs (75 Units total) into the skin 2 (two) times daily with a meal. Replaces:  insulin NPH-regular Human (70-30) 100 UNIT/ML injection   metFORMIN 500 MG tablet Commonly known as:  GLUCOPHAGE Take 1 tablet (500 mg total) by mouth daily with breakfast.   silver sulfADIAZINE 1 % cream Commonly known as:  SILVADENE Apply 1 application topically daily. Apply to foot daily      Follow-up Information    Caprice Beaver, DPM Follow up on 12/24/2017.   Specialty:  Podiatry Why:  Appointment scheduled for 2:00 PM. Contact information: [place] [address] [phone]          Allergies  Allergen Reactions  . Bee Venom Swelling  . Mushroom Extract Complex Nausea And Vomiting    Consultations:  podiatry   Procedures/Studies: Dg Chest 2 View  Result Date: 12/16/2017 CLINICAL DATA:  Right great toe ulcer. EXAM: CHEST - 2 VIEW COMPARISON:  Chest x-ray dated March 18, 2017. FINDINGS: The heart size and mediastinal contours are within normal limits. Both lungs are clear. The visualized skeletal structures are unremarkable. IMPRESSION: No active cardiopulmonary disease. Electronically Signed   By: Titus Dubin M.D.   On: 12/16/2017 15:41   Mri Right Foot With And Without Contrast  Result Date: 12/17/2017 CLINICAL DATA:  Great toe ulcer.  Evaluate for osteomyelitis. EXAM: MRI OF THE RIGHT FOREFOOT WITHOUT AND WITH CONTRAST TECHNIQUE: Multiplanar, multisequence  MR imaging of the right forefoot was performed before and after the administration of intravenous contrast. CONTRAST:  76mL MULTIHANCE GADOBENATE DIMEGLUMINE 529 MG/ML IV SOLN COMPARISON:  Right foot x-rays from yesterday. FINDINGS: Bones/Joint/Cartilage No marrow signal abnormality. No fracture or dislocation. Normal alignment. No joint effusion. Mild osteoarthritis of the first MTP joint. Irregularity of the first IP joint, unchanged. Ligaments Collateral ligaments are intact.  Lisfranc ligament is intact. Muscles and Tendons Flexor, peroneal and extensor compartment tendons are intact. Diffuse fatty atrophy of the intrinsic muscles of the forefoot. Soft tissue Small superficial plantar soft tissue ulceration at the level of the first proximal phalanx base with adjacent mild, enhancing soft tissue swelling involving the great toe. No fluid collection or hematoma. No soft tissue mass. Mild dorsal forefoot soft tissue swelling without enhancement. IMPRESSION: 1. Small superficial soft tissue ulceration along the plantar great toe with adjacent enhancing soft tissue swelling, consistent with cellulitis. No evidence of osteomyelitis or drainable fluid collection. 2. Chronic irregularity of the first IP joint again noted, likely related to prior trauma or old infection. Electronically Signed   By: Titus Dubin M.D.   On: 12/17/2017 08:43   US Arterial Abi (screening Lower Extremity)  Result Date: 12/17/2017 CLINICAL DATA:  Right big toe ulcer. EXAM: NONINVASIVE PHYSIOLOGIC VASCULAR STUDY OF BILATERAL LOWER EXTREMITIES TECHNIQUE: Evaluation of both lower extremities were performed at rest, including calculation of ankle-brachial indices with single level Doppler, pressure and pulse volume recording. COMPARISON:  None. FINDINGS: Right ABI:  1.07 Left ABI:  1.07 Right Lower Extremity:  Normal arterial waveforms  at the ankle. Left Lower Extremity:  Normal arterial waveforms at the ankle. IMPRESSION: Normal resting  ankle-brachial indices. Electronically Signed   By: Markus Daft M.D.   On: 12/17/2017 09:02   Dg Foot Complete Right  Result Date: 12/16/2017 CLINICAL DATA:  Right great toe ulcer. EXAM: RIGHT FOOT COMPLETE - 3+ VIEW COMPARISON:  Right foot x-ray report dated February 21, 2004. FINDINGS: No acute fracture or dislocation. Well corticated, chronic appearing irregularity of the first proximal phalanx head and base of the first distal phalanx. Mild degenerative changes of the first MTP joint. Joint spaces are preserved. Bone mineralization is normal. Mild forefoot soft tissue swelling. IMPRESSION: 1. Well corticated, chronic appearing bony irregularity around the first IP joint, which could be related to old trauma or prior infection. No definite acute bony destructive changes to suggest osteomyelitis. Consider further evaluation with MRI. Electronically Signed   By: Titus Dubin M.D.   On: 12/16/2017 15:50        Discharge Exam: Vitals:   12/19/17 2208 12/20/17 0617  BP: 129/74 125/73  Pulse: 78 (!) 57  Resp: 16 18  Temp: 98 F (36.7 C) 98.4 F (36.9 C)  SpO2: 96% 94%   Vitals:   12/19/17 0617 12/19/17 1514 12/19/17 2208 12/20/17 0617  BP: 114/85 (!) 142/80 129/74 125/73  Pulse: 68 73 78 (!) 57  Resp:  16 16 18   Temp: 97.7 F (36.5 C) 98.6 F (37 C) 98 F (36.7 C) 98.4 F (36.9 C)  TempSrc: Oral Oral Oral Oral  SpO2: 95% 97% 96% 94%  Weight: 132.8 kg (292 lb 12.3 oz)     Height:        General: Pt is alert, awake, not in acute distress Cardiovascular: RRR, S1/S2 +, no rubs, no gallops Respiratory: CTA bilaterally, no wheezing, no rhonchi Abdominal: Soft, NT, ND, bowel sounds + Extremities: no edema, no cyanosis   The results of significant diagnostics from this hospitalization (including imaging, microbiology, ancillary and laboratory) are listed below for reference.    Significant Diagnostic Studies: Dg Chest 2 View  Result Date: 12/16/2017 CLINICAL DATA:  Right great  toe ulcer. EXAM: CHEST - 2 VIEW COMPARISON:  Chest x-ray dated March 18, 2017. FINDINGS: The heart size and mediastinal contours are within normal limits. Both lungs are clear. The visualized skeletal structures are unremarkable. IMPRESSION: No active cardiopulmonary disease. Electronically Signed   By: Titus Dubin M.D.   On: 12/16/2017 15:41   Mri Right Foot With And Without Contrast  Result Date: 12/17/2017 CLINICAL DATA:  Great toe ulcer.  Evaluate for osteomyelitis. EXAM: MRI OF THE RIGHT FOREFOOT WITHOUT AND WITH CONTRAST TECHNIQUE: Multiplanar, multisequence MR imaging of the right forefoot was performed before and after the administration of intravenous contrast. CONTRAST:  35mL MULTIHANCE GADOBENATE DIMEGLUMINE 529 MG/ML IV SOLN COMPARISON:  Right foot x-rays from yesterday. FINDINGS: Bones/Joint/Cartilage No marrow signal abnormality. No fracture or dislocation. Normal alignment. No joint effusion. Mild osteoarthritis of the first MTP joint. Irregularity of the first IP joint, unchanged. Ligaments Collateral ligaments are intact.  Lisfranc ligament is intact. Muscles and Tendons Flexor, peroneal and extensor compartment tendons are intact. Diffuse fatty atrophy of the intrinsic muscles of the forefoot. Soft tissue Small superficial plantar soft tissue ulceration at the level of the first proximal phalanx base with adjacent mild, enhancing soft tissue swelling involving the great toe. No fluid collection or hematoma. No soft tissue mass. Mild dorsal forefoot soft tissue swelling without enhancement. IMPRESSION: 1. Small superficial soft  tissue ulceration along the plantar great toe with adjacent enhancing soft tissue swelling, consistent with cellulitis. No evidence of osteomyelitis or drainable fluid collection. 2. Chronic irregularity of the first IP joint again noted, likely related to prior trauma or old infection. Electronically Signed   By: Titus Dubin M.D.   On: 12/17/2017 08:43   US  Arterial Abi (screening Lower Extremity)  Result Date: 12/17/2017 CLINICAL DATA:  Right big toe ulcer. EXAM: NONINVASIVE PHYSIOLOGIC VASCULAR STUDY OF BILATERAL LOWER EXTREMITIES TECHNIQUE: Evaluation of both lower extremities were performed at rest, including calculation of ankle-brachial indices with single level Doppler, pressure and pulse volume recording. COMPARISON:  None. FINDINGS: Right ABI:  1.07 Left ABI:  1.07 Right Lower Extremity:  Normal arterial waveforms at the ankle. Left Lower Extremity:  Normal arterial waveforms at the ankle. IMPRESSION: Normal resting ankle-brachial indices. Electronically Signed   By: Markus Daft M.D.   On: 12/17/2017 09:02   Dg Foot Complete Right  Result Date: 12/16/2017 CLINICAL DATA:  Right great toe ulcer. EXAM: RIGHT FOOT COMPLETE - 3+ VIEW COMPARISON:  Right foot x-ray report dated February 21, 2004. FINDINGS: No acute fracture or dislocation. Well corticated, chronic appearing irregularity of the first proximal phalanx head and base of the first distal phalanx. Mild degenerative changes of the first MTP joint. Joint spaces are preserved. Bone mineralization is normal. Mild forefoot soft tissue swelling. IMPRESSION: 1. Well corticated, chronic appearing bony irregularity around the first IP joint, which could be related to old trauma or prior infection. No definite acute bony destructive changes to suggest osteomyelitis. Consider further evaluation with MRI. Electronically Signed   By: Titus Dubin M.D.   On: 12/16/2017 15:50     Microbiology: Recent Results (from the past 240 hour(s))  Culture, blood (Routine x 2)     Status: None (Preliminary result)   Collection Time: 12/16/17  6:32 PM  Result Value Ref Range Status   Specimen Description LEFT ANTECUBITAL  Final   Special Requests   Final    BOTTLES DRAWN AEROBIC AND ANAEROBIC Blood Culture adequate volume   Culture   Final    NO GROWTH 4 DAYS Performed at [facility], [address]  [place], [place] [address]    Report Status PENDING  Incomplete  Culture, blood (Routine x 2)     Status: None (Preliminary result)   Collection Time: 12/16/17  6:39 PM  Result Value Ref Range Status   Specimen Description RIGHT ANTECUBITAL  Final   Special Requests   Final    BOTTLES DRAWN AEROBIC AND ANAEROBIC Blood Culture adequate volume   Culture   Final    NO GROWTH 4 DAYS Performed at [facility], [address], [place], [place] [address]    Report Status PENDING  Incomplete  MRSA PCR Screening     Status: None   Collection Time: 12/17/17  1:17 AM  Result Value Ref Range Status   MRSA by PCR NEGATIVE NEGATIVE Final    Comment:        The GeneXpert MRSA Assay (FDA approved for NASAL specimens only), is one component of a comprehensive MRSA colonization surveillance program. It is not intended to diagnose MRSA infection nor to guide or monitor treatment for MRSA infections. Performed at [facility], [address], [place],  [address]   Surgical pcr screen     Status: Abnormal   Collection Time: 12/17/17  9:12 PM  Result Value Ref Range Status   MRSA, PCR NEGATIVE NEGATIVE Final  Staphylococcus aureus POSITIVE (A) NEGATIVE Final    Comment: RESULT CALLED TO, READ BACK BY AND VERIFIED WITH: BREE,RN @ 0413 ON 6.19.19 BY BOWMAN,L (NOTE) The Xpert SA Assay (FDA approved for NASAL specimens in patients 69 years of age and older), is one component of a comprehensive surveillance program. It is not intended to diagnose infection nor to guide or monitor treatment. Performed at [facility], [address] [place], [place] [address]   Anaerobic culture     Status: None (Preliminary result)   Collection Time: 12/18/17  7:59 AM  Result Value Ref Range Status   Specimen Description   Final    WOUND RIGHT FOOT ULCER Performed at [facility] [address], [place], [place] [address]    Special Requests   Final    RIGHT FOOT,ULCER Performed at [facility], [address] [place], [place] [address]    Gram Stain PENDING  Incomplete   Culture PENDING  Incomplete   Report Status PENDING  Incomplete  Aerobic Culture (superficial specimen)     Status: None (Preliminary result)   Collection Time: 12/18/17  7:59 AM  Result Value Ref Range Status   Specimen Description   Final    WOUND RIGHT FOOT ULCER Performed at [facility], [address] [place], [place] [address]    Special Requests   Final    RIGHT FOOT ULCER Performed at [facility], [address] [place] [address], [place],  [address]    Gram Stain   Final    RARE WBC PRESENT, PREDOMINANTLY PMN NO ORGANISMS SEEN    Culture   Final    CULTURE REINCUBATED FOR BETTER GROWTH Performed at [facility], [place] [address] [place],  [address]    Report Status PENDING  Incomplete     Labs: Basic Metabolic Panel: Recent Labs  Lab 12/16/17 1827 12/16/17 1843 12/17/17 0505 12/18/17 0512 12/19/17 0431 12/20/17 0444  NA 131*  --  135 137 136 141  K 3.6  --  3.7 3.5 3.3* 3.5  CL 95*  --  100* 103 102 104  CO2 22  --  26 26 29 30   GLUCOSE 328*  --  337* 308* 238* 207*  BUN 10  --  11 9 8  5*  CREATININE 0.61  --  0.64 0.43* 0.53* 0.44*  CALCIUM 8.7*  --  8.0* 8.0* 7.9* 8.2*  MG  --  2.0  --   --   --  1.9  PHOS  --  3.6  --   --   --   --    Liver Function Tests: Recent Labs  Lab 12/16/17 1827  AST 11*  ALT 19  ALKPHOS 93  BILITOT 1.4*  PROT 7.7  ALBUMIN 3.6   No results for input(s): LIPASE, AMYLASE in the last 168 hours. No results for input(s): AMMONIA in the last 168 hours. CBC: Recent Labs  Lab 12/16/17 1827 12/17/17 0505 12/18/17 0512 12/19/17 0431  WBC 15.6* 14.0* 10.1 9.2  NEUTROABS 12.2* 9.6* 6.5 5.3  HGB 15.9 13.7 13.5 12.3*  HCT 47.4 42.4 41.8 38.5*  MCV 82.3 84.3 83.9 83.9  PLT 220 243 236 242   Cardiac Enzymes: No results for input(s): CKTOTAL, CKMB, CKMBINDEX, TROPONINI in the last 168 hours. BNP: Invalid input(s):  POCBNP CBG: Recent Labs  Lab 12/19/17 0800 12/19/17 1131 12/19/17 1636 12/19/17 2023 12/20/17 0739  GLUCAP 232* 220* 171* 210* 205*    Time coordinating discharge:  36  minutes  Signed:  Orson Eva, DO [facility] Pager: [phone] 12/20/2017, 10:36 AM

## 2017-12-19 NOTE — Progress Notes (Signed)
Podiatry Progress Note  Subjective Zachary Schneider is POD 1 S/P debridement of diabetic foot ulceration of his right foot.  Doing well.  Pain controlled.  No nausea, vomiting, fever or chills.  Objective Vital signs in last 24 hours:   Temp:  [97.7 F (36.5 C)-98.1 F (36.7 C)] 97.7 F (36.5 C) (06/20 0617) Pulse Rate:  [68-76] 68 (06/20 0617) Resp:  [18-20] 20 (06/19 2109) BP: (114-143)/(66-94) 114/85 (06/20 0617) SpO2:  [95 %-98 %] 95 % (06/20 0617) Weight:  [292 lb 12.3 oz (132.8 kg)] 292 lb 12.3 oz (132.8 kg) (06/20 0617)  DRESSING:  Clean, dry and intact. INTEGUMENT:  Significant improvement in appearance of ulceration.  Erythema has resolved. VASCULAR:  Pedal pulses are palpable.  Edema of forefoot consistent with course. MUSCULOSKELETAL:  Calves are soft and nontender.  Lab/Test Results  Recent Labs    12/18/17 0512 12/19/17 0431  WBC 10.1 9.2  HGB 13.5 12.3*  HCT 41.8 38.5*  PLT 236 242  NA 137 136  K 3.5 3.3*  CL 103 102  CO2 26 29  BUN 9 8  CREATININE 0.43* 0.53*  GLUCOSE 308* 238*  CALCIUM 8.0* 7.9*    Recent Results (from the past 240 hour(s))  Culture, blood (Routine x 2)     Status: None (Preliminary result)   Collection Time: 12/16/17  6:32 PM  Result Value Ref Range Status   Specimen Description LEFT ANTECUBITAL  Final   Special Requests   Final    BOTTLES DRAWN AEROBIC AND ANAEROBIC Blood Culture adequate volume   Culture   Final    NO GROWTH 3 DAYS Performed at [facility], [address], [place], [place] [address]    Report Status PENDING  Incomplete  Culture, blood (Routine x 2)     Status: None (Preliminary result)   Collection Time: 12/16/17  6:39 PM  Result Value Ref Range Status   Specimen Description RIGHT ANTECUBITAL  Final   Special Requests   Final    BOTTLES DRAWN AEROBIC AND ANAEROBIC Blood Culture adequate volume   Culture   Final    NO GROWTH 3 DAYS Performed at [facility], [address] [place], [place] [address]    Report Status PENDING  Incomplete  MRSA PCR Screening     Status: None   Collection Time: 12/17/17  1:17 AM  Result Value Ref Range Status   MRSA by PCR NEGATIVE NEGATIVE Final    Comment:        The GeneXpert MRSA Assay (FDA approved for NASAL specimens only), is one component of a comprehensive MRSA colonization surveillance program. It is not intended to diagnose MRSA infection nor to guide or monitor treatment for MRSA infections. Performed at [facility], [address] [place], [place] [address]   Surgical pcr screen     Status: Abnormal   Collection Time: 12/17/17  9:12 PM  Result Value Ref Range Status   MRSA, PCR NEGATIVE NEGATIVE Final   Staphylococcus aureus POSITIVE (A) NEGATIVE Final    Comment: RESULT CALLED TO, READ BACK BY AND VERIFIED WITH: BREE,RN @ 0413 ON 6.19.19 BY BOWMAN,L (NOTE) The Xpert SA Assay (FDA approved for NASAL specimens in patients 85 years of age and older), is one component of a comprehensive surveillance program. It is not intended to diagnose infection nor to guide or monitor treatment. Performed at [facility], [address], [place], [place] [address]   Anaerobic culture     Status: None (Preliminary result)  Collection Time: 12/18/17  7:59 AM  Result Value Ref Range Status   Specimen Description   Final    WOUND RIGHT FOOT ULCER Performed at [facility], [place] [address] [place], [place] [address]    Special Requests   Final    RIGHT FOOT,ULCER Performed at [facility], [address], [place], [place] [address]    Gram Stain PENDING  Incomplete   Culture PENDING  Incomplete   Report Status PENDING  Incomplete  Aerobic Culture (superficial specimen)     Status: None (Preliminary result)   Collection Time: 12/18/17  7:59 AM  Result Value Ref Range Status   Specimen Description   Final    WOUND RIGHT FOOT ULCER Performed at [facility], [address], [place], [place] [address]    Special Requests   Final     RIGHT FOOT ULCER Performed at [facility], [address] [place], [place] [address]    Gram Stain   Final    RARE WBC PRESENT, PREDOMINANTLY PMN NO ORGANISMS SEEN    Culture   Final    CULTURE REINCUBATED FOR BETTER GROWTH Performed at [facility], [place] [address] [place], [place] [address]    Report Status PENDING  Incomplete     No results found.  Medications Scheduled Meds: . Chlorhexidine Gluconate Cloth  6 each Topical Daily  . heparin injection (subcutaneous)  5,000 Units Subcutaneous Q8H  . insulin aspart  0-20 Units Subcutaneous TID WC  . insulin aspart protamine- aspart  60 Units Subcutaneous BID WC  . multivitamin with minerals  1 tablet Oral Daily  . mupirocin ointment  1 application Nasal BID  . mupirocin ointment  1 application Nasal BID  . nutrition supplement (JUVEN)  1 packet Oral BID BM   Continuous Infusions: . lactated ringers 50 mL/hr at 12/19/17 0305  . piperacillin-tazobactam 3.375 g (12/19/17 1223)  . vancomycin Stopped (12/19/17 0756)   PRN Meds:.nicotine, oxyCODONE  Assessment Diabetic foot ulceration, right foot  Plan Dressing changed.  Okay for discharge from my standpoint with following recommendations: 1.  Ciprofloxacin 500 mg BID x 10 days 2.  Clindamycin 300 mg TID x 10 days 3.  Silvadene dressing to R foot.  Change daily. 4.  Wear DARCO (surgical) shoe at all times when ambulating.  Limit ambulation to restroom only. 5.  Follow-up with me at my [place] office on 12/24/2017 at 2:00 PM.  Thank your for allowing me to participate in the care of Mr. [place].  Brita Romp 12/19/2017, 12:33 PM

## 2017-12-19 NOTE — Progress Notes (Signed)
PROGRESS NOTE  Zachary Schneider GHW:[id number] DOB: Sep 23, 1970 DOA: 12/16/2017 PCP: Default, Provider, MD  Brief History:  47 y.o.malewith medical history significant oftesticular cancer, type 2 diabetes, morbid obesity, tobacco use who is coming to the emergency department due to his chronic right foot callus becoming ulcerated, tender with a malodorous discharge.  Per patient, he has required debridement in the past of hiscalluses, but he has never been this severe. Over the weekend, he noticed that this was bothering a little bit more than usual, but he was able to get up and put up some shoes this morning. He made an appointment to see podiatry tomorrow. However around noontime today, he noticed he developed intense pain in the area. He subsequently took his shoe and noticed that there was a lot more erythema, edema and foul-smelling discharge than earlier. So he decided not to wait until he sees podiatry 6/18 and came to the emergency department.  He mentions that his blood glucoses have been in the 200 range, but over the last few days they have been higher than usual. He denies fever, chills, sore throat, dyspnea, chest pain, palpitations, dizziness, orthopnea, recent pitting edema lower extremities, abdominal pain, nausea, emesis, diarrhea, melena, constipation, hematochezia, dysuria, frequency, hematuria or oliguria. Denies polyuria, polydipsia or polyphagia. No heat or cold intolerance  Assessment/Plan: Diabetic foot infection -Continue vancomycin and Zosyn pending culture data -Appreciate podiatry -ABIs--normal -12/18/2017--excisional debridement of diabetic foot ulcers -12/17/2017 MRI foot negative for osteomyelitis or drainable fluid collection  Diabetes mellitus type 2, uncontrolled with hyperglycemia -12/17/2017 hemoglobin A1c 13.1 -Increase 70/30 insulin to 70 units bid -Patient has been seen by nutrition and diabetic educator -pt endorses dietary  indiscreation  Sepsis -Secondary to diabetic foot infection -Sepsis physiology improved -Continued IV antibiotics pending culture data -d/c home with clinda and cipro per podiatry -Lactic acid 1.2  Morbid obesity -BMI 39.20 -Lifestyle modification  Tobacco abuse -I have discussed tobacco cessation with the patient.  I have counseled the patient regarding the negative impacts of continued tobacco use including but not limited to lung cancer, COPD, and cardiovascular disease.  I have discussed alternatives to tobacco and modalities that may help facilitate tobacco cessation including but not limited to biofeedback, hypnosis, and medications.  Total time spent with tobacco counseling was 4 minutes.  Hypokalemia -replete -check mag    Disposition Plan:   Home 6/21 if stable Family Communication:   Aunt updated at bedside 6/20  Consultants:  podiatry  Code Status:  FULL DVT Prophylaxis:  Blanket Heparin    Procedures: As Listed in Progress Note Above  Antibiotics: vanco 6/17>>>6/20 Zosyn 6/18>>>6/20      Subjective: Patient denies fevers, chills, headache, chest pain, dyspnea, nausea, vomiting, diarrhea, abdominal pain, dysuria, hematuria, hematochezia, and melena.   Objective: Vitals:   12/18/17 2018 12/18/17 2109 12/19/17 0617 12/19/17 1514  BP:  124/66 114/85 (!) 142/80  Pulse:  76 68 73  Resp:  20  16  Temp:  98.1 F (36.7 C) 97.7 F (36.5 C) 98.6 F (37 C)  TempSrc:  Oral Oral Oral  SpO2: 98% 96% 95% 97%  Weight:   132.8 kg (292 lb 12.3 oz)   Height:        Intake/Output Summary (Last 24 hours) at 12/19/2017 1625 Last data filed at 12/19/2017 1223 Gross per 24 hour  Intake 2866.67 ml  Output -  Net 2866.67 ml   Weight change:  Exam:   General:  Pt is alert, follows commands appropriately, not in acute distress  HEENT: No icterus, No thrush, No neck mass, [place]/AT  Cardiovascular: RRR, S1/S2, no rubs, no gallops  Respiratory: CTA  bilaterally, no wheezing, no crackles, no rhonchi  Abdomen: Soft/+BS, non tender, non distended, no guarding  Extremities: No edema, No lymphangitis, No petechiae, No rashes, no synovitis;  Right foot in bulky dressing   Data Reviewed: I have personally reviewed following labs and imaging studies Basic Metabolic Panel: Recent Labs  Lab 12/16/17 1827 12/16/17 1843 12/17/17 0505 12/18/17 0512 12/19/17 0431  NA 131*  --  135 137 136  K 3.6  --  3.7 3.5 3.3*  CL 95*  --  100* 103 102  CO2 22  --  26 26 29   GLUCOSE 328*  --  337* 308* 238*  BUN 10  --  11 9 8   CREATININE 0.61  --  0.64 0.43* 0.53*  CALCIUM 8.7*  --  8.0* 8.0* 7.9*  MG  --  2.0  --   --   --   PHOS  --  3.6  --   --   --    Liver Function Tests: Recent Labs  Lab 12/16/17 1827  AST 11*  ALT 19  ALKPHOS 93  BILITOT 1.4*  PROT 7.7  ALBUMIN 3.6   No results for input(s): LIPASE, AMYLASE in the last 168 hours. No results for input(s): AMMONIA in the last 168 hours. Coagulation Profile: Recent Labs  Lab 12/16/17 1827  INR 0.96   CBC: Recent Labs  Lab 12/16/17 1827 12/17/17 0505 12/18/17 0512 12/19/17 0431  WBC 15.6* 14.0* 10.1 9.2  NEUTROABS 12.2* 9.6* 6.5 5.3  HGB 15.9 13.7 13.5 12.3*  HCT 47.4 42.4 41.8 38.5*  MCV 82.3 84.3 83.9 83.9  PLT 220 243 236 242   Cardiac Enzymes: No results for input(s): CKTOTAL, CKMB, CKMBINDEX, TROPONINI in the last 168 hours. BNP: Invalid input(s): POCBNP CBG: Recent Labs  Lab 12/18/17 1113 12/18/17 1649 12/18/17 2106 12/19/17 0800 12/19/17 1131  GLUCAP 236* 273* 233* 232* 220*   HbA1C: Recent Labs    12/17/17 0505  HGBA1C 13.1*   Urine analysis:    Component Value Date/Time   COLORURINE YELLOW 12/16/2017 1900   APPEARANCEUR CLEAR 12/16/2017 1900   LABSPEC 1.036 (H) 12/16/2017 1900   PHURINE 6.0 12/16/2017 1900   GLUCOSEU >=500 (A) 12/16/2017 1900   HGBUR NEGATIVE 12/16/2017 1900   BILIRUBINUR NEGATIVE 12/16/2017 1900   KETONESUR 80 (A)  12/16/2017 1900   PROTEINUR NEGATIVE 12/16/2017 1900   NITRITE NEGATIVE 12/16/2017 1900   LEUKOCYTESUR NEGATIVE 12/16/2017 1900   Sepsis Labs: @LABRCNTIP (procalcitonin:4,lacticidven:4) ) Recent Results (from the past 240 hour(s))  Culture, blood (Routine x 2)     Status: None (Preliminary result)   Collection Time: 12/16/17  6:32 PM  Result Value Ref Range Status   Specimen Description LEFT ANTECUBITAL  Final   Special Requests   Final    BOTTLES DRAWN AEROBIC AND ANAEROBIC Blood Culture adequate volume   Culture   Final    NO GROWTH 3 DAYS Performed at [facility], [address], [place], [place] [address]    Report Status PENDING  Incomplete  Culture, blood (Routine x 2)     Status: None (Preliminary result)   Collection Time: 12/16/17  6:39 PM  Result Value Ref Range Status   Specimen Description RIGHT ANTECUBITAL  Final   Special Requests   Final    BOTTLES DRAWN AEROBIC AND ANAEROBIC Blood  Culture adequate volume   Culture   Final    NO GROWTH 3 DAYS Performed at [facility], [address], [place], [place] [address]    Report Status PENDING  Incomplete  MRSA PCR Screening     Status: None   Collection Time: 12/17/17  1:17 AM  Result Value Ref Range Status   MRSA by PCR NEGATIVE NEGATIVE Final    Comment:        The GeneXpert MRSA Assay (FDA approved for NASAL specimens only), is one component of a comprehensive MRSA colonization surveillance program. It is not intended to diagnose MRSA infection nor to guide or monitor treatment for MRSA infections. Performed at [facility], [address], [place], [place] [address]   Surgical pcr screen     Status: Abnormal   Collection Time: 12/17/17  9:12 PM  Result Value Ref Range Status   MRSA, PCR NEGATIVE NEGATIVE Final   Staphylococcus aureus POSITIVE (A) NEGATIVE Final    Comment: RESULT CALLED TO, READ BACK BY AND VERIFIED WITH: BREE,RN @ 0413 ON 6.19.19 BY BOWMAN,L (NOTE) The Xpert SA Assay (FDA approved for  NASAL specimens in patients 66 years of age and older), is one component of a comprehensive surveillance program. It is not intended to diagnose infection nor to guide or monitor treatment. Performed at [facility], [address] [place], [place] [address]   Anaerobic culture     Status: None (Preliminary result)   Collection Time: 12/18/17  7:59 AM  Result Value Ref Range Status   Specimen Description   Final    WOUND RIGHT FOOT ULCER Performed at [facility] [address], [place], [place] [address]    Special Requests   Final    RIGHT FOOT,ULCER Performed at [facility], [address], [place], [place] [address]    Gram Stain PENDING  Incomplete   Culture PENDING  Incomplete   Report Status PENDING  Incomplete  Aerobic Culture (superficial specimen)     Status: None (Preliminary result)   Collection Time: 12/18/17  7:59 AM  Result Value Ref Range Status   Specimen Description   Final    WOUND RIGHT FOOT ULCER Performed at [facility], [address] [place],  [address]    Special Requests   Final    RIGHT FOOT ULCER Performed at [facility], [address], [place],  [address]    Gram Stain   Final    RARE WBC PRESENT, PREDOMINANTLY PMN NO ORGANISMS SEEN    Culture   Final    CULTURE REINCUBATED FOR BETTER GROWTH Performed at [facility], [place] [address] [place],  [address]    Report Status PENDING  Incomplete     Scheduled Meds: . Chlorhexidine Gluconate Cloth  6 each Topical Daily  . heparin injection (subcutaneous)  5,000 Units Subcutaneous Q8H  . insulin aspart  0-20 Units Subcutaneous TID WC  . insulin aspart protamine- aspart  70 Units Subcutaneous BID WC  . multivitamin with minerals  1 tablet Oral Daily  . mupirocin ointment  1 application Nasal BID  . mupirocin ointment  1 application Nasal BID  . nutrition supplement (JUVEN)  1 packet Oral BID BM   Continuous Infusions: . lactated ringers 50 mL/hr at  12/19/17 0305  . piperacillin-tazobactam Stopped (12/19/17 1623)  . vancomycin 1,500 mg (12/19/17 1602)    Procedures/Studies: Dg Chest 2 View  Result Date: 12/16/2017 CLINICAL DATA:  Right great toe ulcer.  EXAM: CHEST - 2 VIEW COMPARISON:  Chest x-ray dated March 18, 2017. FINDINGS: The heart size and mediastinal contours are within normal limits. Both lungs are clear. The visualized skeletal structures are unremarkable. IMPRESSION: No active cardiopulmonary disease. Electronically Signed   By: Titus Dubin M.D.   On: 12/16/2017 15:41   Mri Right Foot With And Without Contrast  Result Date: 12/17/2017 CLINICAL DATA:  Great toe ulcer.  Evaluate for osteomyelitis. EXAM: MRI OF THE RIGHT FOREFOOT WITHOUT AND WITH CONTRAST TECHNIQUE: Multiplanar, multisequence MR imaging of the right forefoot was performed before and after the administration of intravenous contrast. CONTRAST:  37mL MULTIHANCE GADOBENATE DIMEGLUMINE 529 MG/ML IV SOLN COMPARISON:  Right foot x-rays from yesterday. FINDINGS: Bones/Joint/Cartilage No marrow signal abnormality. No fracture or dislocation. Normal alignment. No joint effusion. Mild osteoarthritis of the first MTP joint. Irregularity of the first IP joint, unchanged. Ligaments Collateral ligaments are intact.  Lisfranc ligament is intact. Muscles and Tendons Flexor, peroneal and extensor compartment tendons are intact. Diffuse fatty atrophy of the intrinsic muscles of the forefoot. Soft tissue Small superficial plantar soft tissue ulceration at the level of the first proximal phalanx base with adjacent mild, enhancing soft tissue swelling involving the great toe. No fluid collection or hematoma. No soft tissue mass. Mild dorsal forefoot soft tissue swelling without enhancement. IMPRESSION: 1. Small superficial soft tissue ulceration along the plantar great toe with adjacent enhancing soft tissue swelling, consistent with cellulitis. No evidence of osteomyelitis or drainable  fluid collection. 2. Chronic irregularity of the first IP joint again noted, likely related to prior trauma or old infection. Electronically Signed   By: Titus Dubin M.D.   On: 12/17/2017 08:43   US Arterial Abi (screening Lower Extremity)  Result Date: 12/17/2017 CLINICAL DATA:  Right big toe ulcer. EXAM: NONINVASIVE PHYSIOLOGIC VASCULAR STUDY OF BILATERAL LOWER EXTREMITIES TECHNIQUE: Evaluation of both lower extremities were performed at rest, including calculation of ankle-brachial indices with single level Doppler, pressure and pulse volume recording. COMPARISON:  None. FINDINGS: Right ABI:  1.07 Left ABI:  1.07 Right Lower Extremity:  Normal arterial waveforms at the ankle. Left Lower Extremity:  Normal arterial waveforms at the ankle. IMPRESSION: Normal resting ankle-brachial indices. Electronically Signed   By: Markus Daft M.D.   On: 12/17/2017 09:02   Dg Foot Complete Right  Result Date: 12/16/2017 CLINICAL DATA:  Right great toe ulcer. EXAM: RIGHT FOOT COMPLETE - 3+ VIEW COMPARISON:  Right foot x-ray report dated February 21, 2004. FINDINGS: No acute fracture or dislocation. Well corticated, chronic appearing irregularity of the first proximal phalanx head and base of the first distal phalanx. Mild degenerative changes of the first MTP joint. Joint spaces are preserved. Bone mineralization is normal. Mild forefoot soft tissue swelling. IMPRESSION: 1. Well corticated, chronic appearing bony irregularity around the first IP joint, which could be related to old trauma or prior infection. No definite acute bony destructive changes to suggest osteomyelitis. Consider further evaluation with MRI. Electronically Signed   By: Titus Dubin M.D.   On: 12/16/2017 15:50    Orson Eva, DO  [facility] Pager [phone]  If 7PM-7AM, please contact night-coverage [email or web address] Password [email or web address] 12/19/2017, 4:25 PM   LOS: 3 days

## 2017-12-19 NOTE — Progress Notes (Addendum)
Pharmacy Antibiotic Note  Zachary GUTRIDGE is a 47 y.o. male admitted on 12/16/2017 with Diabetic Foot Infection.  Pharmacy has been consulted for Vancomycin and zosyn  dosing. VT reported as 72mcg/ml. Excision debridement of infected diabetic ulcer, right foot 6/19.  12/17/2017 MRI foot negative for osteomyelitis or drainable fluid collection  Plan: Continue Vancomycin 1.5gm IV every 8 hours.  Goal trough 10-15 mcg/mL Continue zosyn 3.375gm IV q8h EID, infused over 4 hours F/U cxs and clinical progress Monitor labs, micro and vitals.   Height: 6' (182.9 cm) Weight: 292 lb 12.3 oz (132.8 kg) IBW/kg (Calculated) : 77.6  Temp (24hrs), Avg:97.9 F (36.6 C), Min:97.7 F (36.5 C), Max:98.1 F (36.7 C)  Recent Labs  Lab 12/16/17 1827 12/16/17 1833 12/17/17 0505 12/18/17 0512 12/19/17 0431  WBC 15.6*  --  14.0* 10.1 9.2  CREATININE 0.61  --  0.64 0.43* 0.53*  LATICACIDVEN  --  1.2  --   --   --   VANCOTROUGH  --   --   --   --  11*    Estimated Creatinine Clearance: 161 mL/min (A) (by C-G formula based on SCr of 0.53 mg/dL (L)).    Allergies  Allergen Reactions  . Bee Venom Swelling  . Mushroom Extract Complex Nausea And Vomiting    Antimicrobials this admission: Vanc 6/17 >>  Cefepime 6/17 >> 6/18 Zosyn 6/18>> Flagyl 6/17 >>6/18  Dose adjustments this admission: Obesity/Normalized CrCl Vancomycin dosing protocol will be initiated with an estimated normalized CrCl = 152 ml/min.  Microbiology results: 6/17 BCx: ngtd  6/18 MRSA PCR: (+) 6/19 Wound cxs: pending  Thank you for allowing pharmacy to be a part of this patient's care.  Isac Sarna, BS Pharm D, [place] Clinical Pharmacist Pager [phone] 12/19/2017 9:59 AM

## 2017-12-20 DIAGNOSIS — R739 Hyperglycemia, unspecified: Secondary | ICD-10-CM

## 2017-12-20 LAB — BASIC METABOLIC PANEL
Anion gap: 7 (ref 5–15)
BUN: 5 mg/dL — AB (ref 6–20)
CALCIUM: 8.2 mg/dL — AB (ref 8.9–10.3)
CO2: 30 mmol/L (ref 22–32)
Chloride: 104 mmol/L (ref 101–111)
Creatinine, Ser: 0.44 mg/dL — ABNORMAL LOW (ref 0.61–1.24)
GFR calc Af Amer: >60 mL/min (ref >60)
GFR calc non Af Amer: >60 mL/min (ref >60)
GLUCOSE: 207 mg/dL — AB (ref 65–99)
Potassium: 3.5 mmol/L (ref 3.5–5.1)
Sodium: 141 mmol/L (ref 135–145)

## 2017-12-20 LAB — MAGNESIUM: Magnesium: 1.9 mg/dL (ref 1.7–2.4)

## 2017-12-20 LAB — GLUCOSE, CAPILLARY: Glucose-Capillary: 205 mg/dL — ABNORMAL HIGH (ref 65–99)

## 2017-12-20 MED ORDER — CIPROFLOXACIN HCL 500 MG PO TABS: 500.0000 mg | ORAL_TABLET | Freq: Two times a day (BID) | ORAL | 0 refills | Status: DC

## 2017-12-20 MED ORDER — CLINDAMYCIN HCL 300 MG PO CAPS
300.0000 mg | ORAL_CAPSULE | Freq: Four times a day (QID) | ORAL | 0 refills | Status: DC
Start: 2017-12-20 — End: 2021-07-30

## 2017-12-20 MED ORDER — INSULIN ASPART PROT & ASPART (70-30 MIX) 100 UNIT/ML ~~LOC~~ SUSP
75.0000 [IU] | Freq: Two times a day (BID) | SUBCUTANEOUS | Status: DC
  Filled 2017-12-20: qty 10

## 2017-12-20 MED ORDER — METFORMIN HCL 500 MG PO TABS: 500.0000 mg | ORAL_TABLET | Freq: Every day | ORAL | 1 refills | Status: AC

## 2017-12-20 MED ORDER — INSULIN ASPART PROT & ASPART (70-30 MIX) 100 UNIT/ML ~~LOC~~ SUSP: 75.0000 [IU] | Freq: Two times a day (BID) | SUBCUTANEOUS | 1 refills | Status: AC

## 2017-12-20 NOTE — Progress Notes (Signed)
Patient is to be discharged home and in stable condition. Patient's IV removed, WNL. Patient given discharge instructions and verbalized understanding. Patient awaiting transportation and will be escorted out by staff via wheelchair.  Celestia Khat, RN

## 2017-12-20 NOTE — Care Management (Signed)
CM provided pt with list of PCP's accepting new pt's.

## 2017-12-21 LAB — AEROBIC CULTURE W GRAM STAIN (SUPERFICIAL SPECIMEN)

## 2017-12-21 LAB — CULTURE, BLOOD (ROUTINE X 2)
CULTURE: NO GROWTH
Culture: NO GROWTH
Special Requests: ADEQUATE
Special Requests: ADEQUATE

## 2017-12-21 LAB — AEROBIC CULTURE  (SUPERFICIAL SPECIMEN)

## 2017-12-25 LAB — ANAEROBIC CULTURE: Culture: NEGATIVE

## 2018-01-10 ENCOUNTER — Other Ambulatory Visit (HOSPITAL_COMMUNITY)
Admission: RE | Admit: 2018-01-10 | Discharge: 2018-01-10 | Disposition: A | Discharge status: Home / Self Care | Payer: 59 | Source: Ambulatory Visit | Attending: Podiatry | Admitting: Podiatry

## 2018-01-10 DIAGNOSIS — R69 Illness, unspecified: Secondary | ICD-10-CM | POA: Diagnosis present

## 2018-01-10 LAB — BASIC METABOLIC PANEL
Anion gap: 9 (ref 5–15)
BUN: 12 mg/dL (ref 6–20)
CHLORIDE: 102 mmol/L (ref 98–111)
CO2: 26 mmol/L (ref 22–32)
Calcium: 8.8 mg/dL — ABNORMAL LOW (ref 8.9–10.3)
Creatinine, Ser: 0.59 mg/dL — ABNORMAL LOW (ref 0.61–1.24)
GFR calc Af Amer: >60 mL/min (ref >60)
GFR calc non Af Amer: >60 mL/min (ref >60)
Glucose, Bld: 268 mg/dL — ABNORMAL HIGH (ref 70–99)
Potassium: 4.3 mmol/L (ref 3.5–5.1)
Sodium: 137 mmol/L (ref 135–145)

## 2018-01-10 LAB — CBC WITH DIFFERENTIAL/PLATELET
Basophils Absolute: 0 10*3/uL (ref 0.0–0.1)
Basophils Relative: 0 %
EOS ABS: 0.3 10*3/uL (ref 0.0–0.7)
EOS PCT: 3 %
HCT: 46 % (ref 39.0–52.0)
HEMOGLOBIN: 15 g/dL (ref 13.0–17.0)
LYMPHS ABS: 2.1 10*3/uL (ref 0.7–4.0)
Lymphocytes Relative: 23 %
MCH: 26.9 pg (ref 26.0–34.0)
MCHC: 32.6 g/dL (ref 30.0–36.0)
MCV: 82.4 fL (ref 78.0–100.0)
MONOS PCT: 7 %
Monocytes Absolute: 0.6 10*3/uL (ref 0.1–1.0)
NEUTROS PCT: 67 %
Neutro Abs: 6.3 10*3/uL (ref 1.7–7.7)
Platelets: 239 10*3/uL (ref 150–400)
RBC: 5.58 MIL/uL (ref 4.22–5.81)
RDW: 14.6 % (ref 11.5–15.5)
WBC: 9.3 10*3/uL (ref 4.0–10.5)

## 2018-01-10 LAB — SEDIMENTATION RATE: Sed Rate: 17 mm/hr — ABNORMAL HIGH (ref 0–16)

## 2018-01-10 LAB — C-REACTIVE PROTEIN: CRP: 2.9 mg/dL — AB (ref <1.0)

## 2018-01-18 ENCOUNTER — Ambulatory Visit: Payer: Self-pay | Admitting: Podiatry

## 2018-01-25 ENCOUNTER — Ambulatory Visit: Payer: Self-pay | Admitting: Podiatry

## 2019-11-20 IMAGING — DX DG ABDOMEN 1V
2 series · 2 of 2 positions shown · non-contrast
Comparison: CT abdomen and pelvis 07/14/2016.

CLINICAL DATA: Abdominal pain.  Small-bowel obstruction.

EXAM:
ABDOMEN - 1 VIEW

[abdomen kub (1 of 2)]
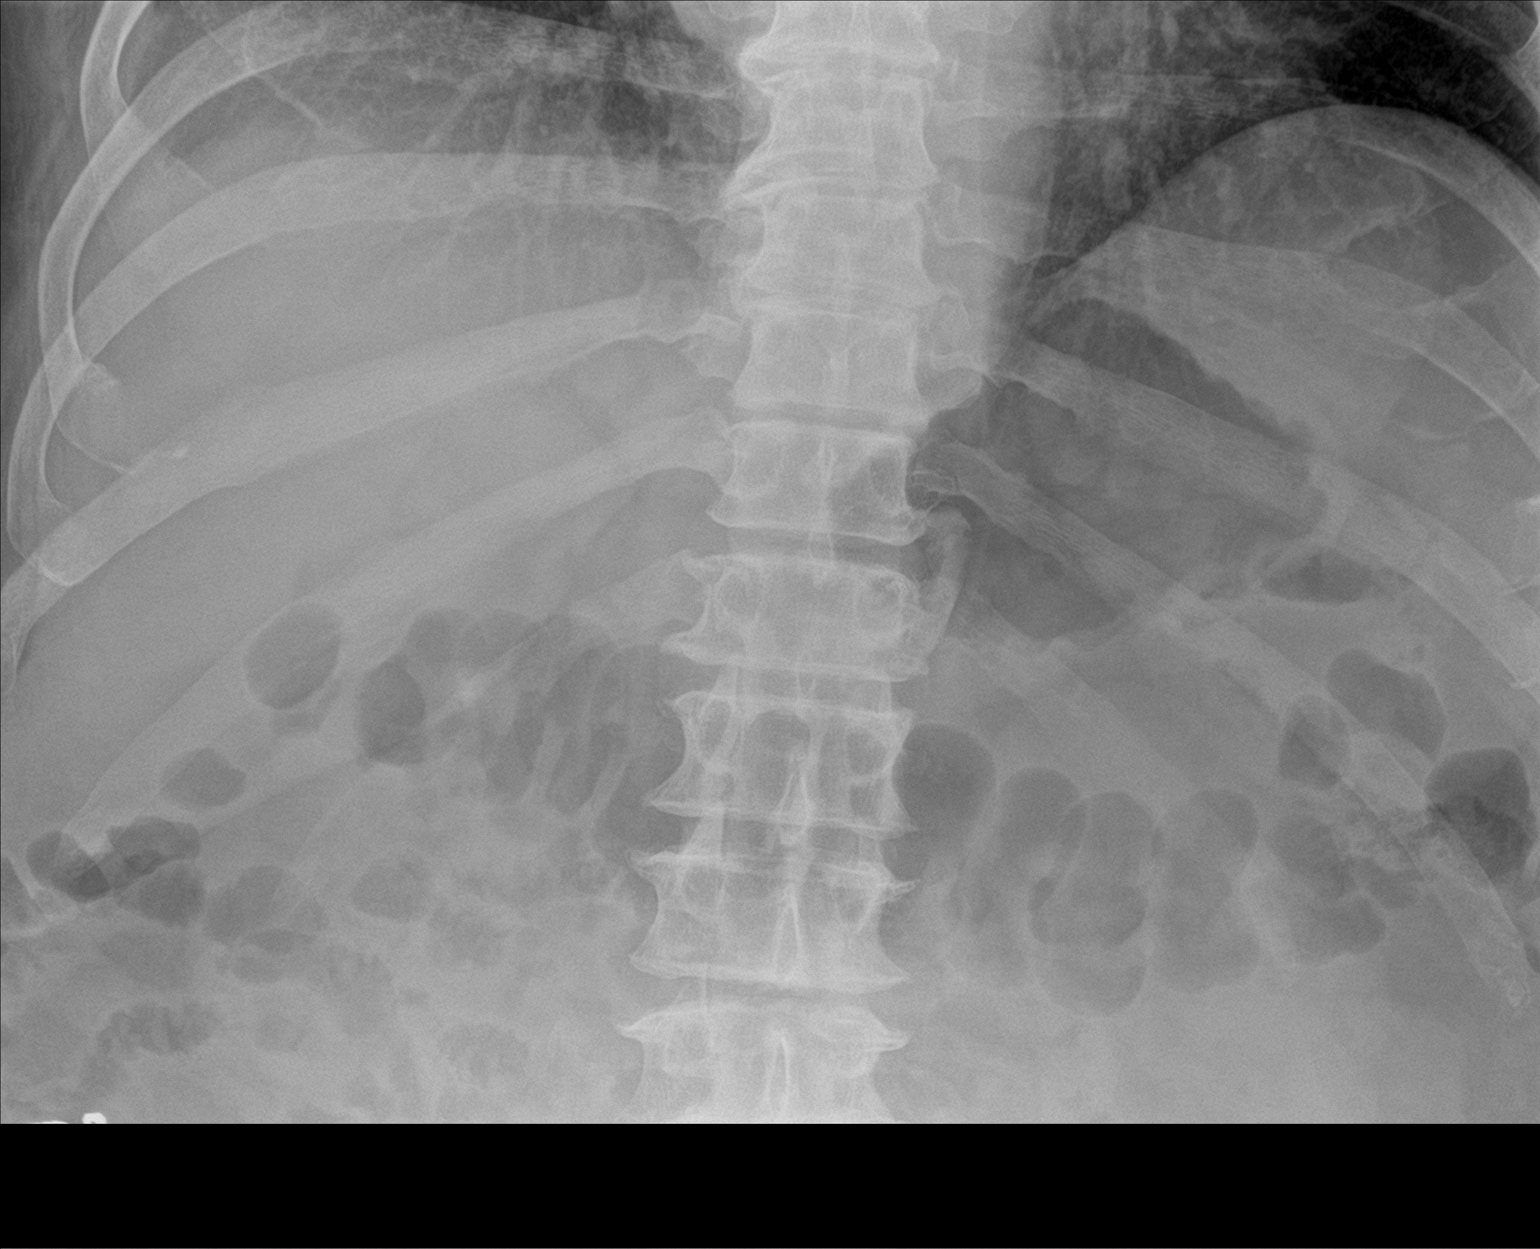

[abdomen kub (2 of 2)]
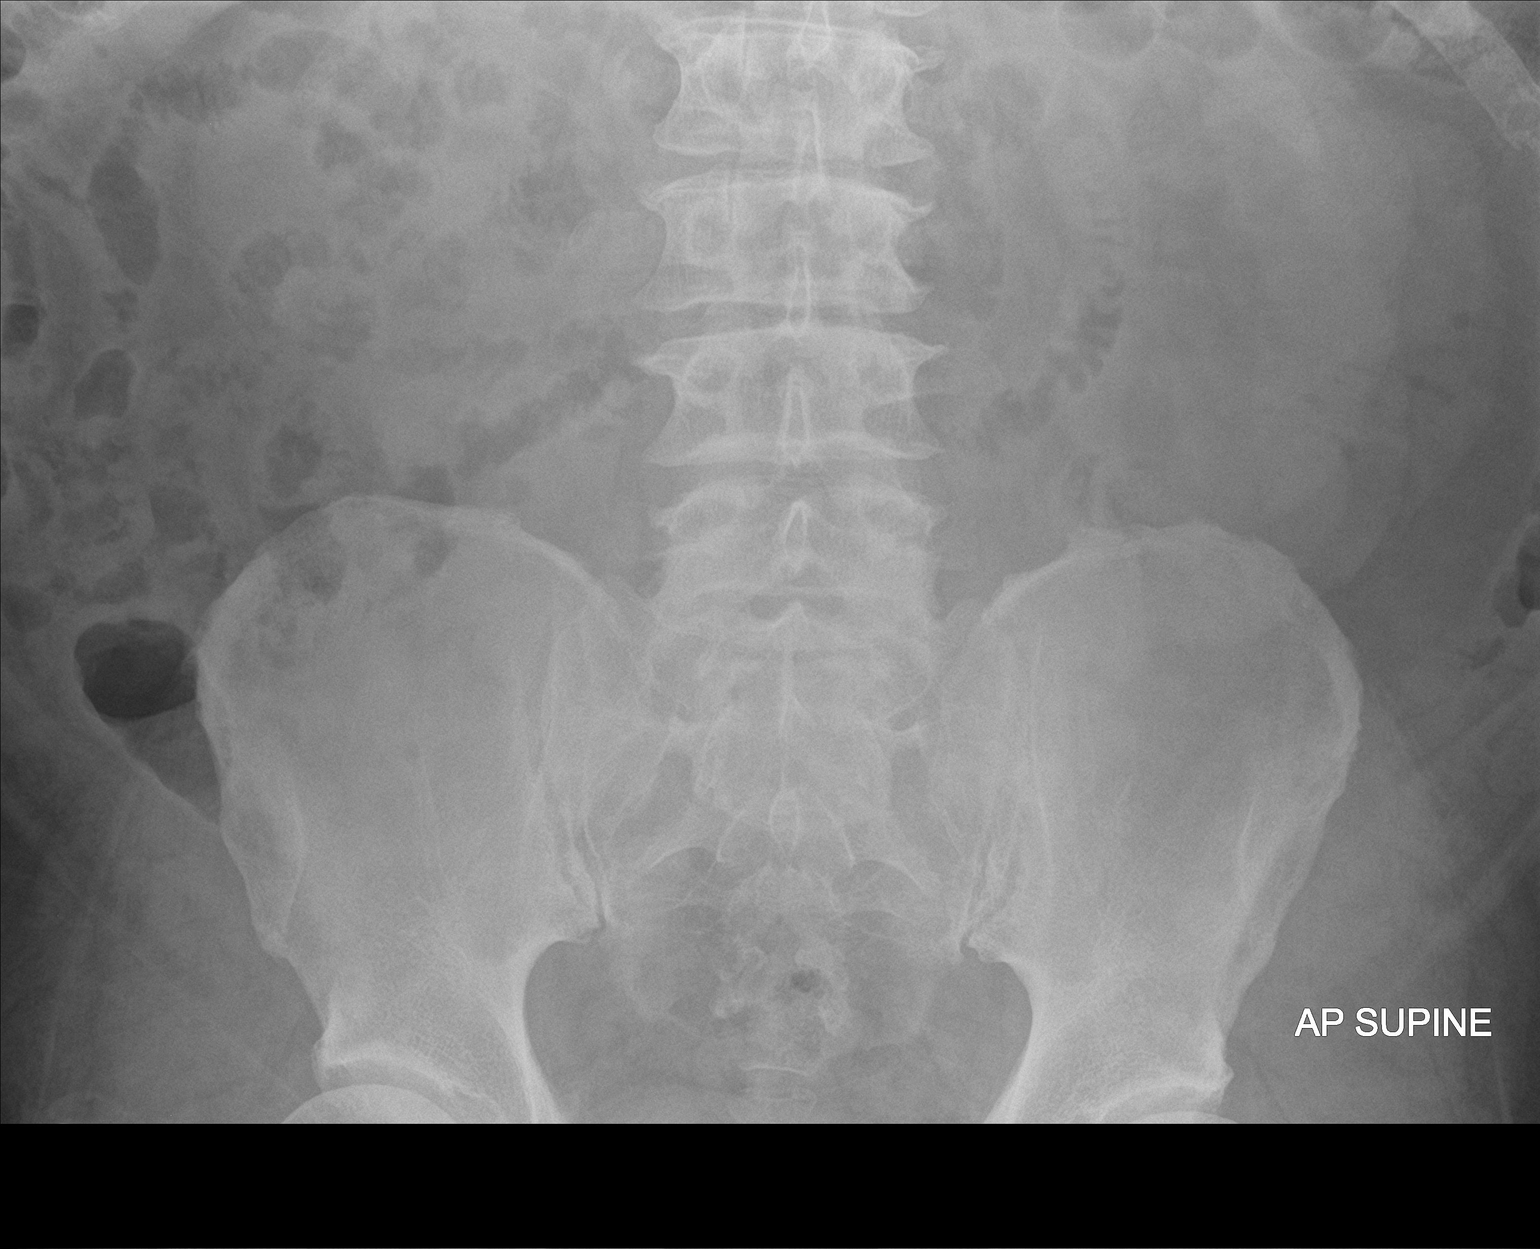

[2 of 2 positions shown; findings below may reference images not displayed]

FINDINGS: Scattered gas-filled but nondistended loops of small bowel are
identified. There is gas and stool in the colon.
IMPRESSION: Gas-filled but nondilated loops of small bowel are most suggestive
enteritis rather than small bowel obstruction or ileus.

## 2019-11-25 IMAGING — US US ABDOMEN COMPLETE
1 series · 13 of 25 positions shown · non-contrast
Comparison: Abdominal and pelvic CT scan July 14, 2017

CLINICAL DATA: Recent episodes of nausea and vomiting and upper
abdominal pain associated with chills. History of obesity, diabetes.
Gallstones and sludge were seen on CT scan yesterday.

EXAM:
ABDOMEN ULTRASOUND COMPLETE

[Series 1: us abdomen complete · 0.21mm/px · 13 of 106 slices shown]
[im 1/106]
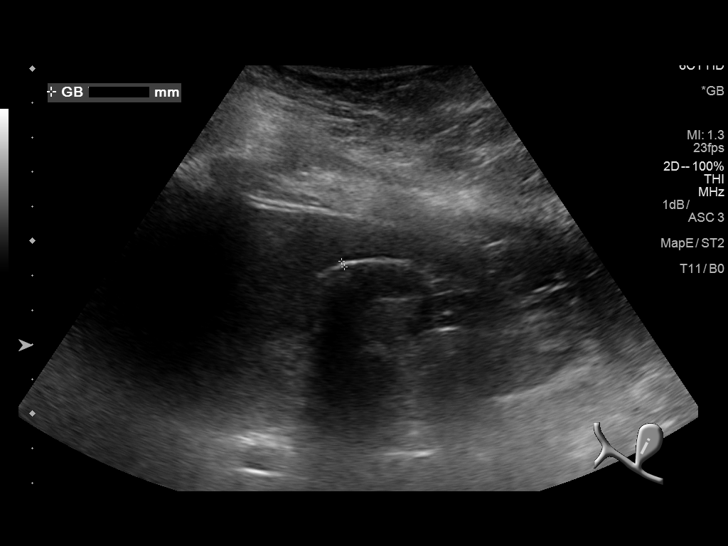
[im 9/106]
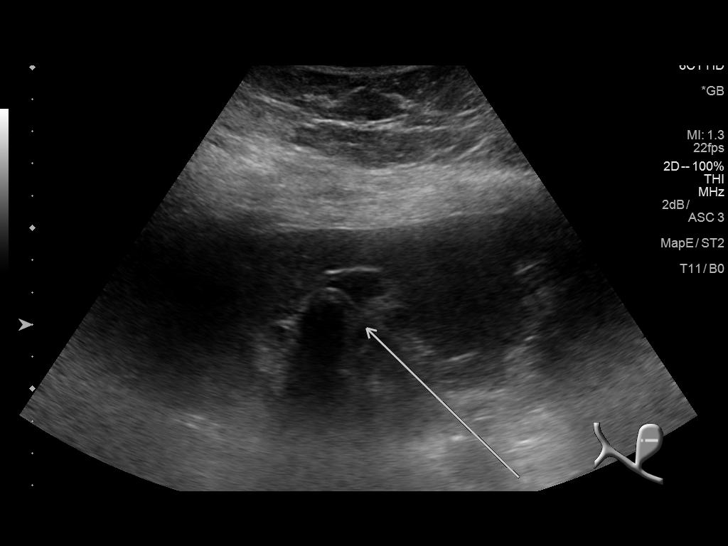
[im 18/106]
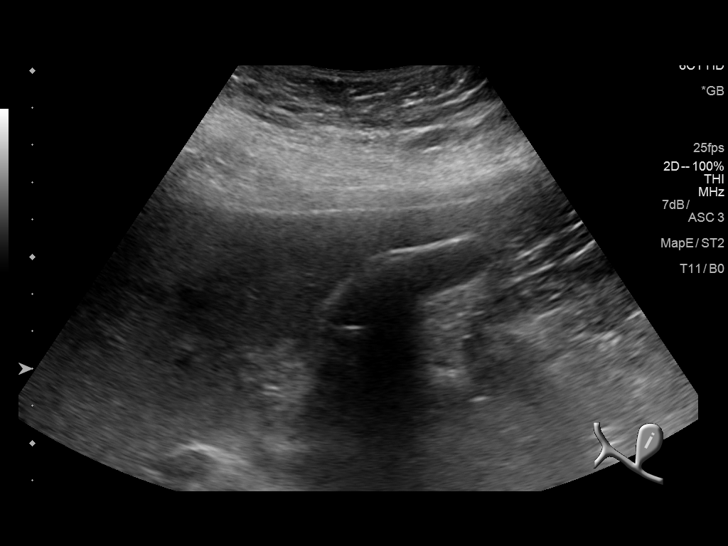
[im 27/106]
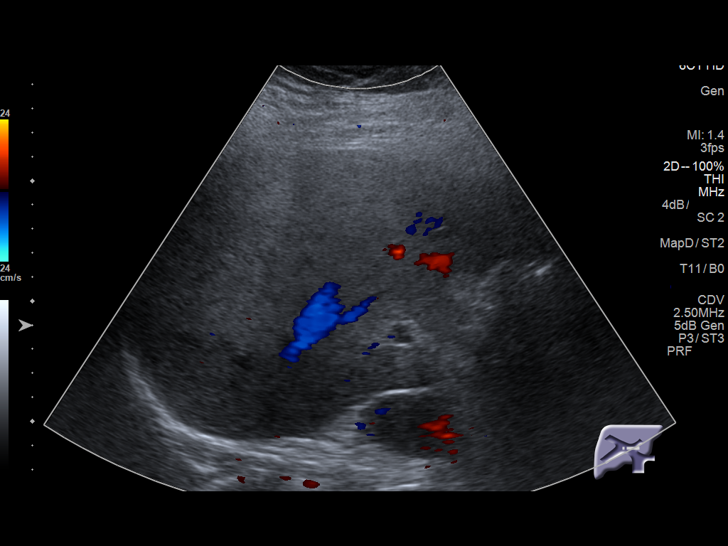
[im 36/106]
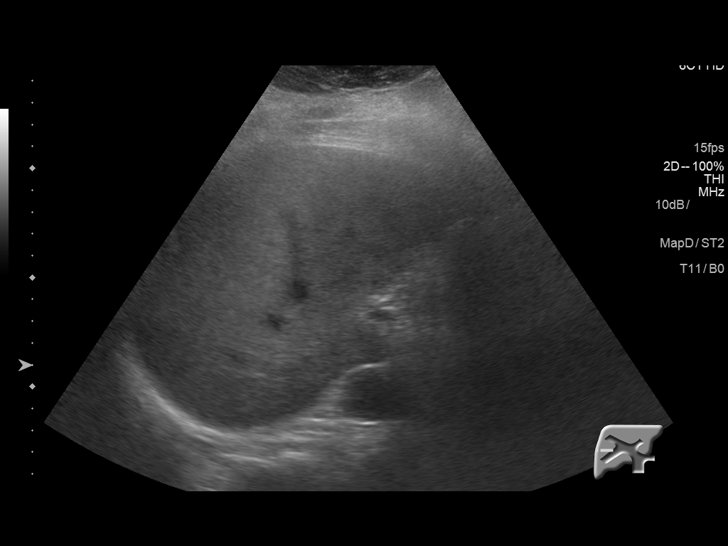
[im 44/106]
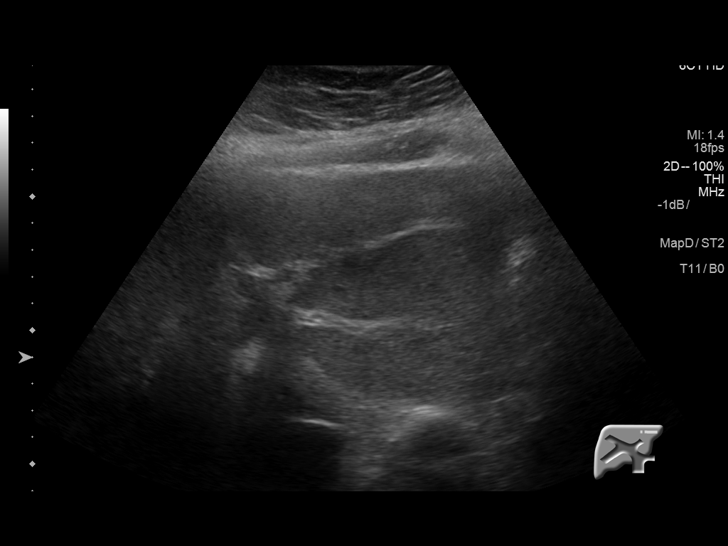
[im 53/106]
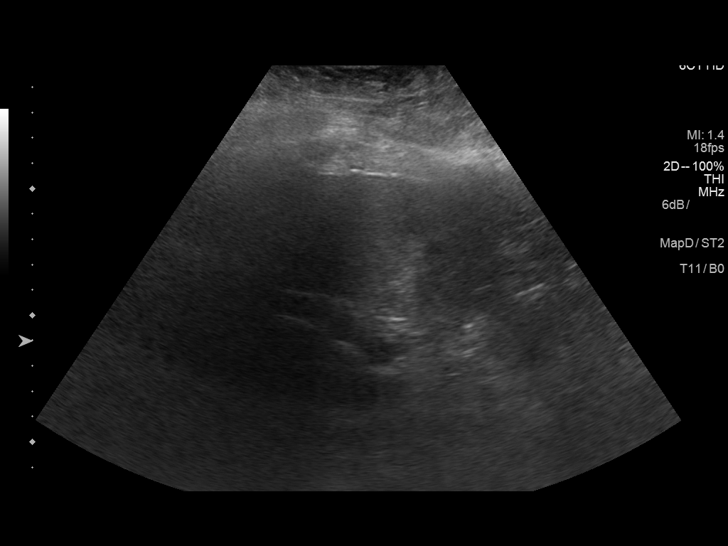
[im 62/106]
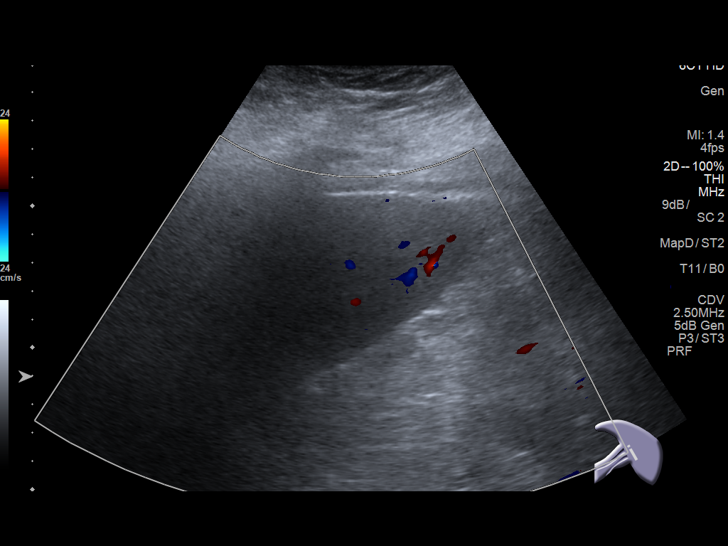
[im 71/106]
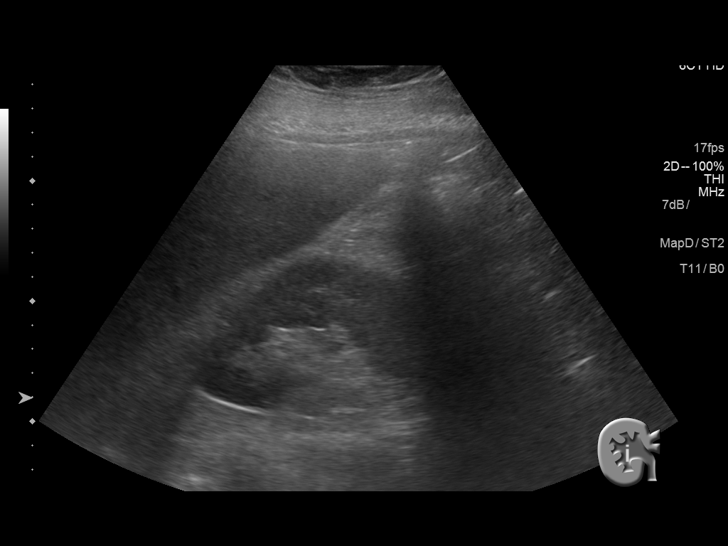
[im 79/106]
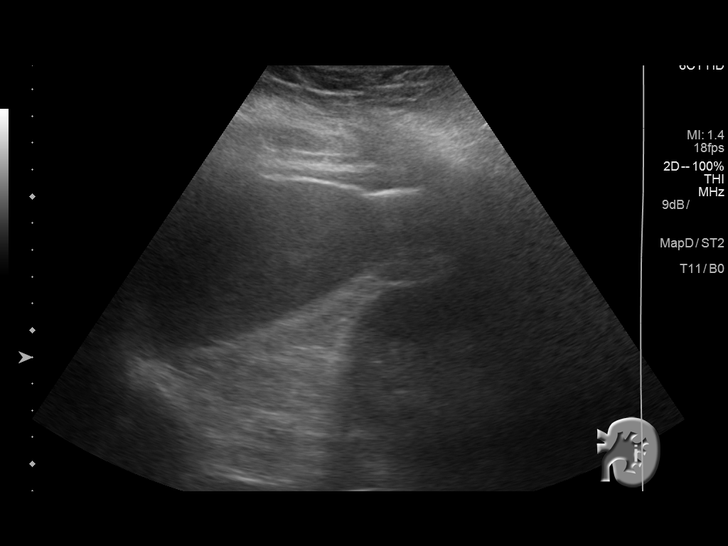
[im 88/106]
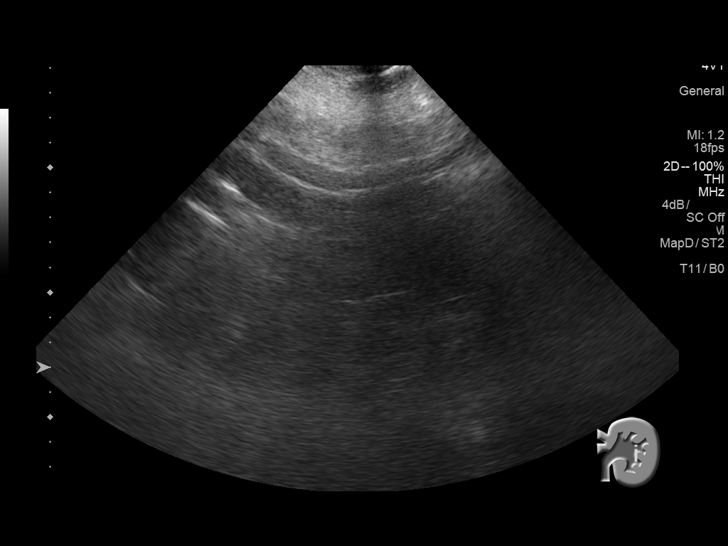
[im 97/106]
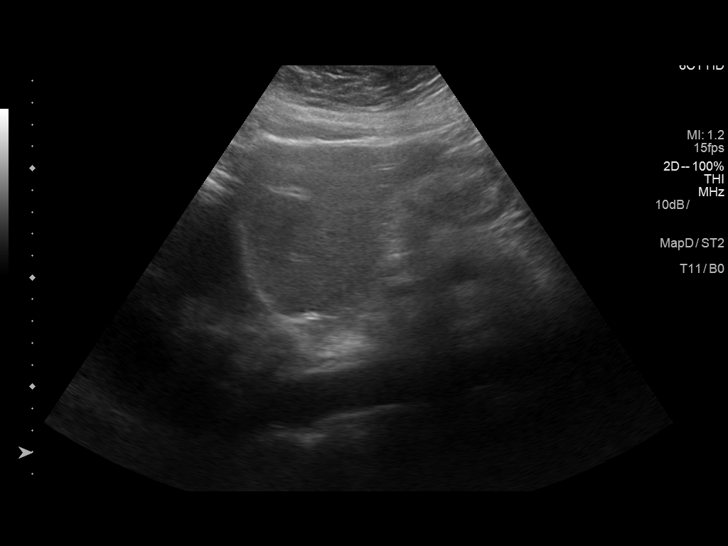
[im 106/106]
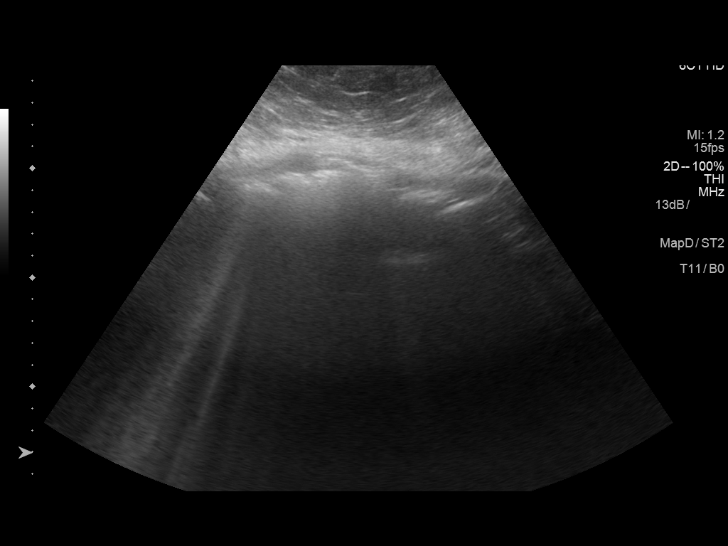

[13 of 25 positions shown; findings below may reference images not displayed]

FINDINGS: Gallbladder: The gallbladder is only partially distended. A large
stone measuring up to 1.4 cm in diameter is observed. There may be
sludge present. There is no gallbladder wall thickening,
pericholecystic fluid, or positive sonographic Murphy's sign.

Common bile duct: Diameter: 5.5 mm

Liver: The hepatic echotexture is subjectively increased. The
surface contour is smooth where visualized. No focal mass or ductal
dilation is observed. Portal vein is patent on color Doppler imaging
with normal direction of blood flow towards the liver.

IVC: No abnormality visualized.

Pancreas: Bowel gas limits evaluation of the pancreas.

Spleen: Size and appearance within normal limits.

Right Kidney: Length: 13.4 cm. Echogenicity within normal limits. No
mass or hydronephrosis visualized.

Left Kidney: Length: 13.9 cm. Echogenicity within normal limits. No
mass or hydronephrosis visualized.

Abdominal aorta: Bowel gas limits evaluation of the abdominal aorta.

Other findings: There is no ascites.
IMPRESSION: The study is somewhat limited due to the patient's body habitus and
excessive bowel gas as well as the patient's inability to tolerate
palpation with the ultrasound probe.

Gallstones without sonographic evidence of acute cholecystitis.
Probable fatty infiltrative change of the liver.

## 2020-04-22 IMAGING — DX DG CHEST 2V
2 series · 2 of 2 positions shown · non-contrast
Comparison: Chest x-ray dated March 18, 2017.

CLINICAL DATA: Right great toe ulcer.

EXAM:
CHEST - 2 VIEW

[chest pa]
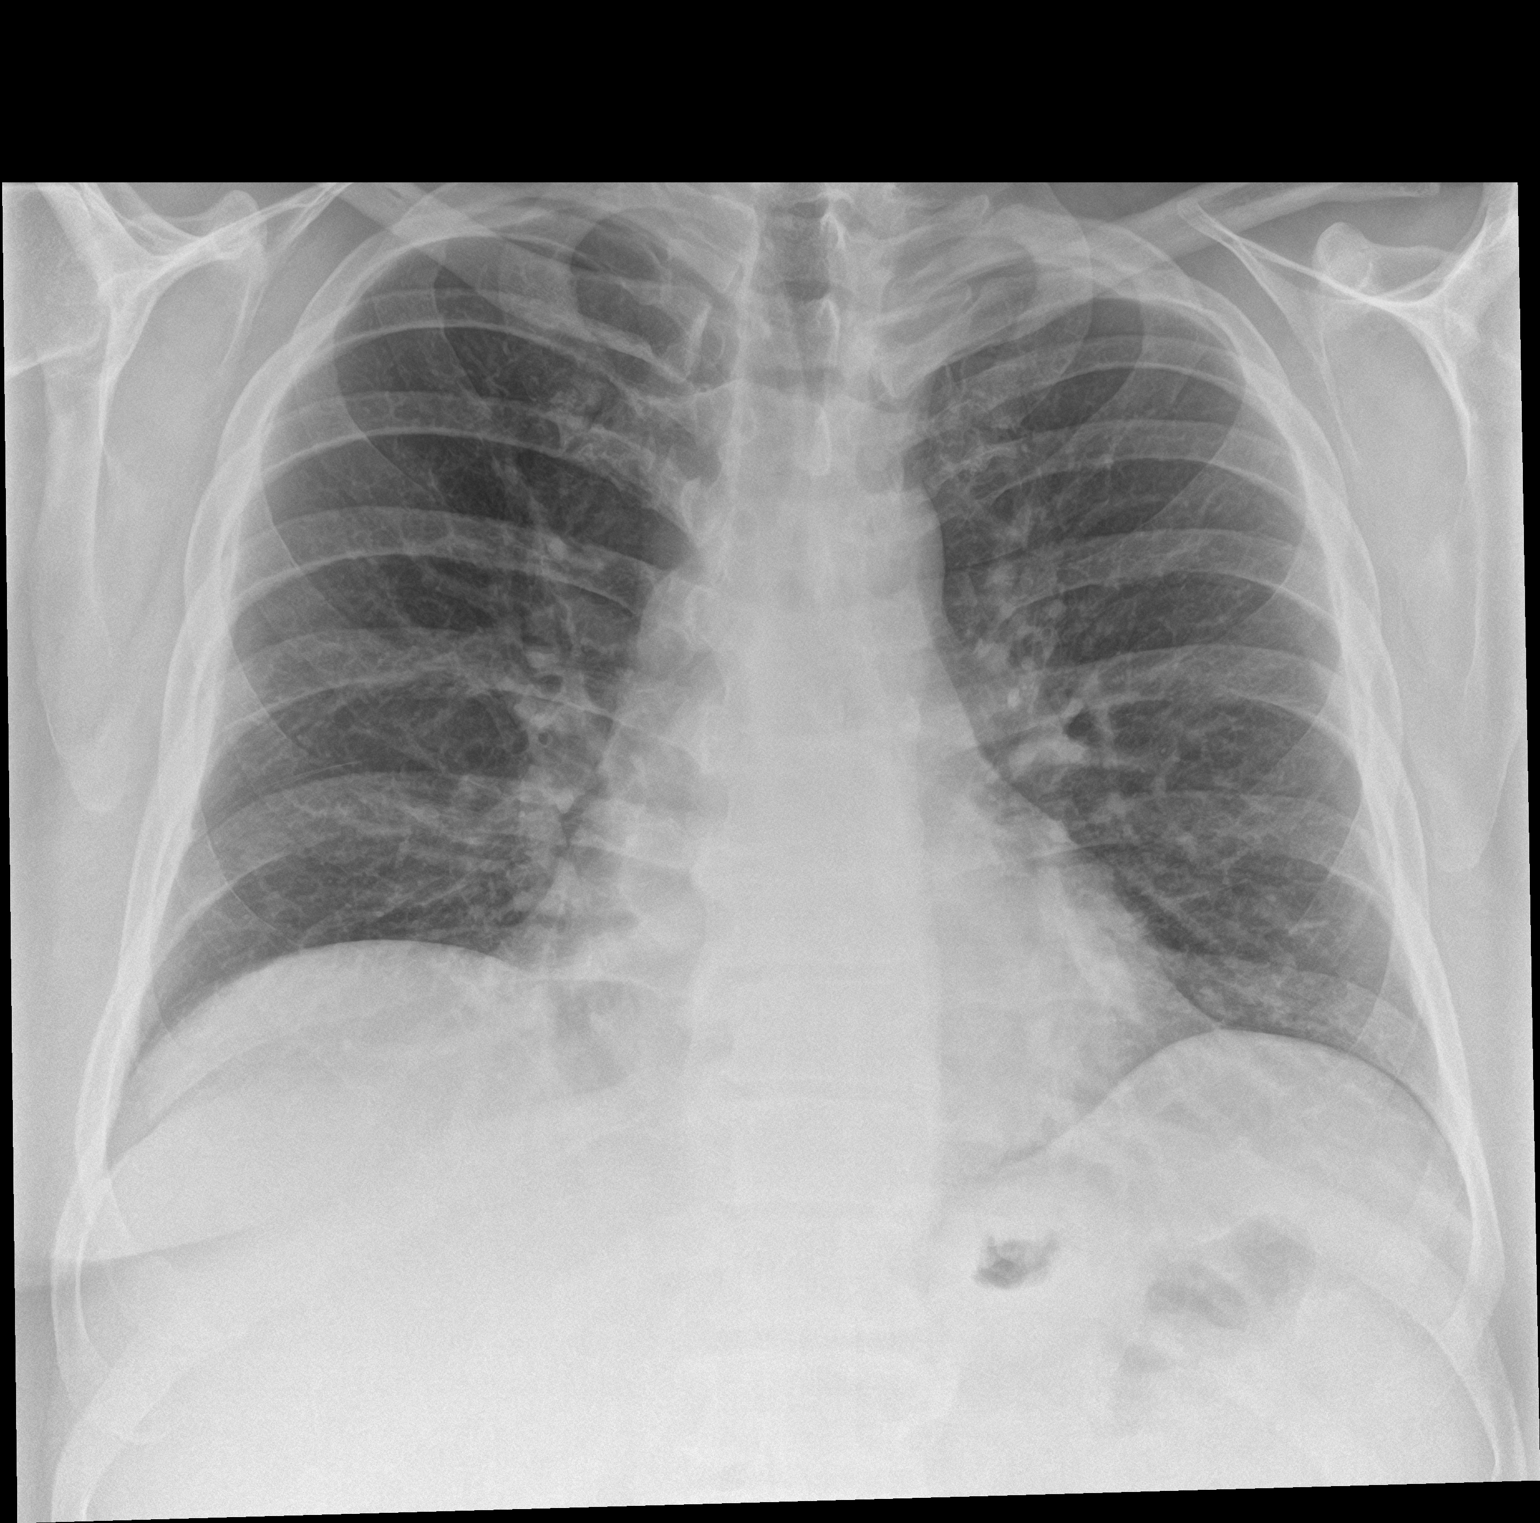

[chest lat]
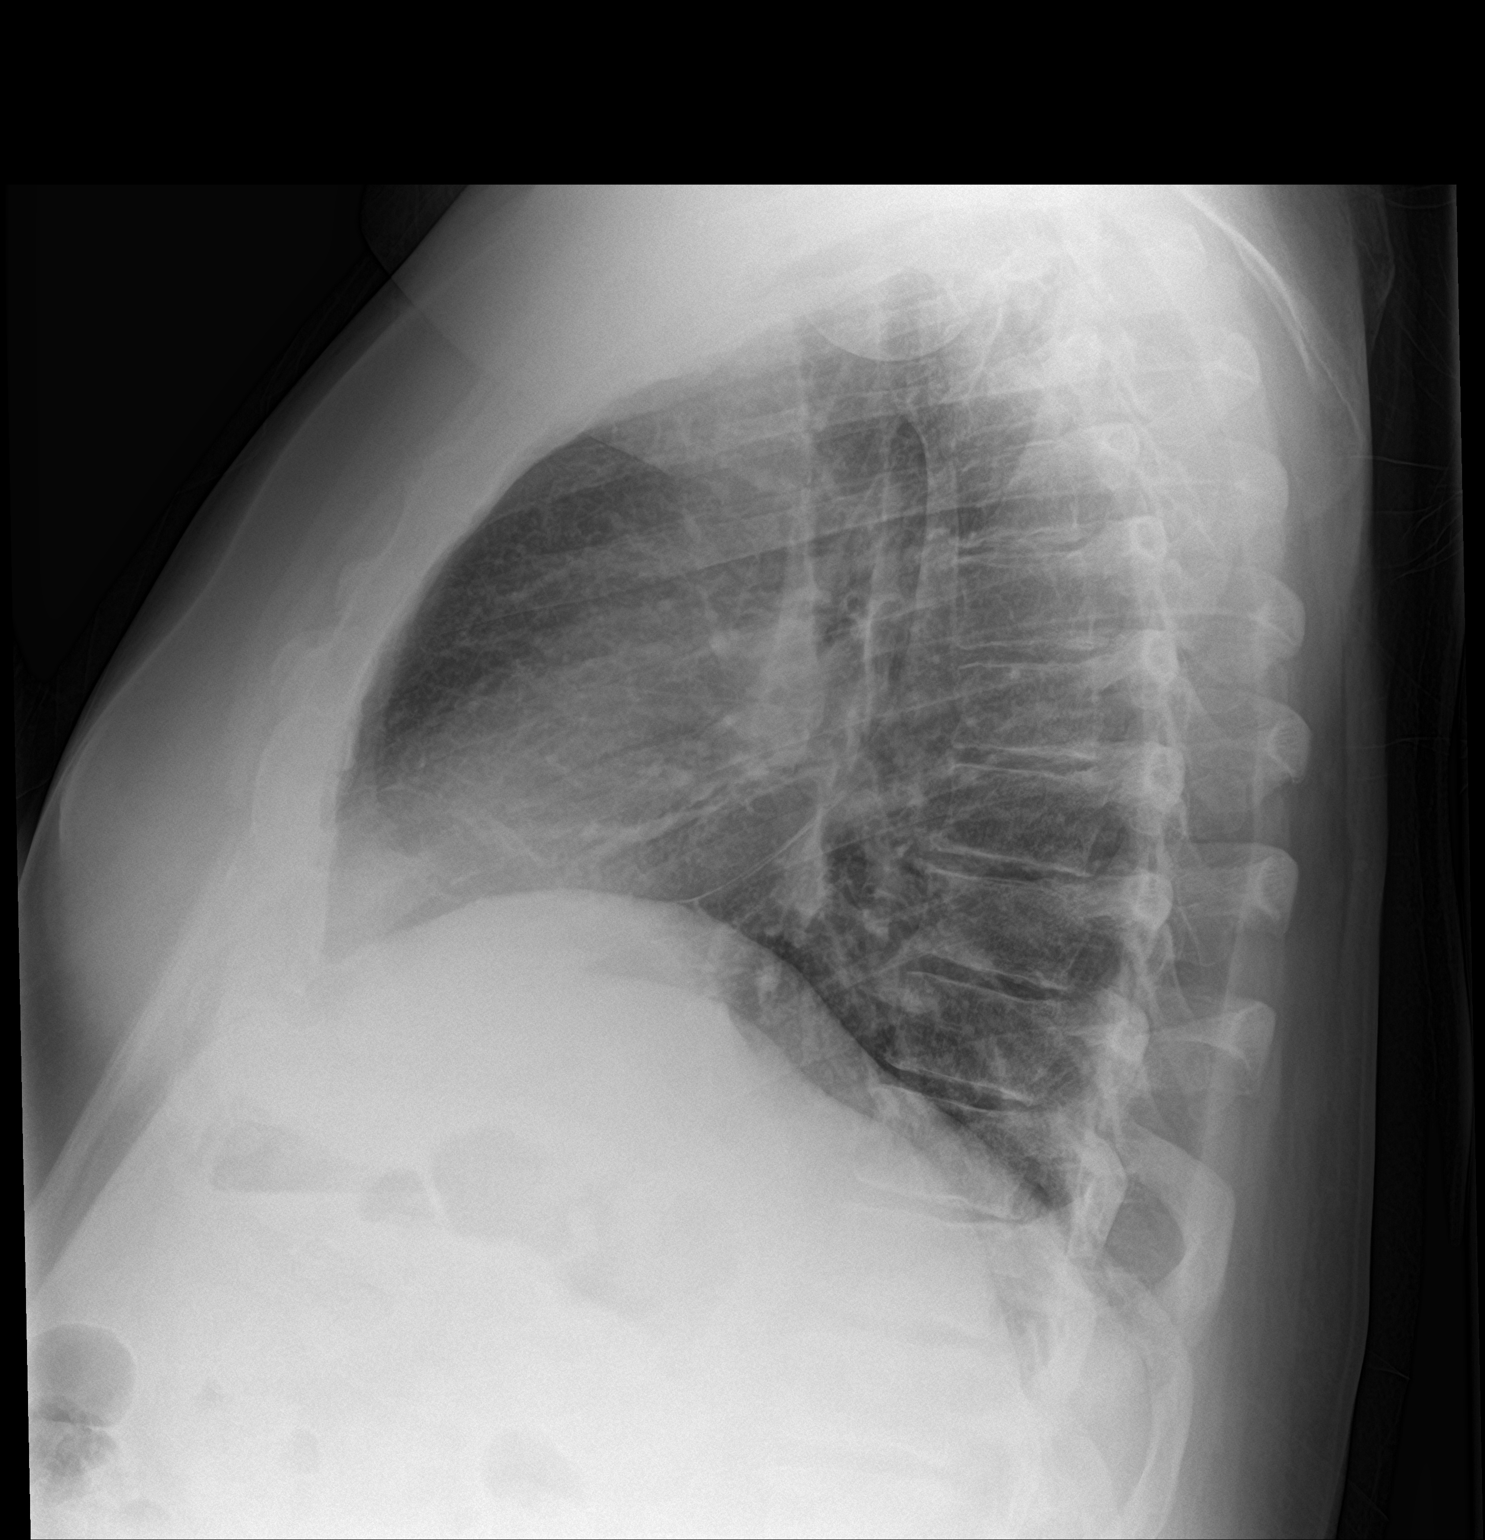

[2 of 2 positions shown; findings below may reference images not displayed]

FINDINGS: The heart size and mediastinal contours are within normal limits.
Both lungs are clear. The visualized skeletal structures are
unremarkable.
IMPRESSION: No active cardiopulmonary disease.

## 2021-07-26 ENCOUNTER — Emergency Department (HOSPITAL_COMMUNITY)
Admission: EM | Admit: 2021-07-26 | Discharge: 2021-07-26 | Disposition: A | Discharge status: Home / Self Care | Payer: 59 | Attending: Emergency Medicine | Admitting: Emergency Medicine

## 2021-07-26 ENCOUNTER — Other Ambulatory Visit: Payer: Self-pay

## 2021-07-26 ENCOUNTER — Encounter (HOSPITAL_COMMUNITY): Payer: Self-pay | Admitting: Emergency Medicine

## 2021-07-26 DIAGNOSIS — Z794 Long term (current) use of insulin: Secondary | ICD-10-CM | POA: Insufficient documentation

## 2021-07-26 DIAGNOSIS — Z7984 Long term (current) use of oral hypoglycemic drugs: Secondary | ICD-10-CM | POA: Insufficient documentation

## 2021-07-26 DIAGNOSIS — E119 Type 2 diabetes mellitus without complications: Secondary | ICD-10-CM | POA: Insufficient documentation

## 2021-07-26 DIAGNOSIS — H01009 Unspecified blepharitis unspecified eye, unspecified eyelid: Secondary | ICD-10-CM | POA: Insufficient documentation

## 2021-07-26 MED ORDER — ERYTHROMYCIN 5 MG/GM OP OINT
TOPICAL_OINTMENT | Freq: Once | OPHTHALMIC | Status: AC
  Filled 2021-07-26: qty 3.5

## 2021-07-26 MED ORDER — FLUORESCEIN SODIUM 1 MG OP STRP
1.0000 | ORAL_STRIP | Freq: Once | OPHTHALMIC | Status: AC
Start: 2021-07-26 — End: 2021-07-26
  Administered 2021-07-26: 22:00:00 1 via OPHTHALMIC
  Filled 2021-07-26: qty 1

## 2021-07-26 MED ORDER — TETRACAINE HCL 0.5 % OP SOLN
2.0000 [drp] | Freq: Once | OPHTHALMIC | Status: AC
  Administered 2021-07-26: 22:00:00 2 [drp] via OPHTHALMIC
  Filled 2021-07-26: qty 4

## 2021-07-26 NOTE — ED Triage Notes (Signed)
Patient reports testing positive for covid Friday. Reports cough and sinus congestion. Sunday when waking his left eye hurt to blink. Has tried warm compresses with no relief. Swelling noted to eye lid and redness noted to sclera.

## 2021-07-26 NOTE — ED Provider Notes (Signed)
[facility] DEPT Provider Note   CSN: [id number] Arrival date & time: 07/26/21  1754     History  No chief complaint on file.   Zachary Schneider is a 51 y.o. male.  HPI  51 year old male with a history of a small bowel obstruction, diabetes, diabetic foot ulcer, who presents to the emergency department today for evaluation of swelling to the left eyelid he noted upon waking this morning.  He reports some irritation to the upper eyelid.  He has some mild discomfort to the eye but no severe pain.  He denies any persistent visual changes.  He does not wear contacts but does wear readers.  No reports of flashers/floaters.  No headache.  He was recently diagnosed with COVID and has had upper respiratory symptoms this week.  Home Medications Prior to Admission medications   Medication Sig Start Date End Date Taking? Authorizing Provider  ciprofloxacin (CIPRO) 500 MG tablet Take 1 tablet (500 mg total) by mouth 2 (two) times daily. 12/20/17   Orson Eva, MD  clindamycin (CLEOCIN) 300 MG capsule Take 1 capsule (300 mg total) by mouth every 6 (six) hours. 12/20/17   Orson Eva, MD  insulin aspart protamine- aspart (NOVOLOG MIX 70/30) (70-30) 100 UNIT/ML injection Inject 0.75 mLs (75 Units total) into the skin 2 (two) times daily with a meal. 12/20/17   Tat, Shanon Brow, MD  metFORMIN (GLUCOPHAGE) 500 MG tablet Take 1 tablet (500 mg total) by mouth daily with breakfast. 12/20/17   Tat, Shanon Brow, MD  silver sulfADIAZINE (SILVADENE) 1 % cream Apply 1 application topically daily. Apply to foot daily 12/19/17   Tat, Shanon Brow, MD      Allergies    Bee venom and Mushroom extract complex    Review of Systems   Review of Systems See HPI for pertinent positives or negatives.  Physical Exam Updated Vital Signs BP (!) 155/83 (BP Location: Left Arm)    Pulse 61    Temp 98 F (36.7 C) (Oral)    Resp 15    Ht 6' (1.829 m)    Wt 109.6 kg    SpO2 97%    BMI 32.77 kg/m  Physical  Exam Constitutional:      General: He is not in acute distress.    Appearance: He is well-developed.  Eyes:     Extraocular Movements: Extraocular movements intact.     Conjunctiva/sclera: Conjunctivae normal.     Pupils: Pupils are equal, round, and reactive to light.     Comments: No pain with EOMs, no photophobia or consensual photophobia.  Fluorescein stain was completed and there was no uptake.  Tonometry was normal bilaterally, right 13 mmHg, left 11 mmHg.  Cardiovascular:     Rate and Rhythm: Normal rate and regular rhythm.  Pulmonary:     Effort: Pulmonary effort is normal.     Breath sounds: Normal breath sounds.  Skin:    General: Skin is warm and dry.  Neurological:     Mental Status: He is alert and oriented to person, place, and time.    ED Results / Procedures / Treatments   Labs (all labs ordered are listed, but only abnormal results are displayed) Labs Reviewed - No data to display  EKG None  Radiology No results found.  Procedures Procedures      Visual Acuity  Right Eye Distance: 40 Left Eye Distance: 35 Bilateral Distance: 40  Right Eye Near: R Near: 20 Left Eye Near:  L Near: 20  Bilateral Near:  20   Medications Ordered in ED Medications  fluorescein ophthalmic strip 1 strip (1 strip Left Eye Given 07/26/21 2141)  tetracaine (PONTOCAINE) 0.5 % ophthalmic solution 2 drop (2 drops Left Eye Given 07/26/21 2141)  erythromycin ophthalmic ointment ( Left Eye Given 07/26/21 2141)    ED Course/ Medical Decision Making/ A&P                           Medical Decision Making Risk Prescription drug management.   51 year old male presents to the emergency department today with swelling and discomfort to the upper eye lid of the left.  No persistent vision changes.  Visual acuity completed here and was without acute abnormality.  Fluorescein stain was completed without acute abnormality.  Tonometry within normal limits.  Suspect patient may have  blepharitis versus chalazion.  Will treat with erythromycin ointment.  Advise continuation of warm compresses.  Will give information for follow-up for follow-up should the patient's symptoms persist.  Do not feel he requires further w/u or admission at this time. Advised on return precautions if he has any new or worsening symptoms.  He voices understanding of the plan and reasons to return.  All questions answered.  Patient stable for discharge.  Final Clinical Impression(s) / ED Diagnoses Final diagnoses:  Blepharitis, unspecified laterality, unspecified type    Rx / DC Orders ED Discharge Orders     None         Bishop Dublin 07/26/21 2149    Hayden Rasmussen, MD 07/27/21 1121

## 2021-07-26 NOTE — Discharge Instructions (Signed)
Use the erythromycin ointment every 6 hours for one week  You were given a referral to an ophthalmologist if your symptoms persist you can call and make an appointment.  If you have any new or worsening symptoms the meantime please return to the emergency department immediately.

## 2021-07-30 ENCOUNTER — Other Ambulatory Visit: Payer: Self-pay

## 2021-07-30 ENCOUNTER — Encounter (HOSPITAL_COMMUNITY): Payer: Self-pay

## 2021-07-30 ENCOUNTER — Emergency Department (HOSPITAL_COMMUNITY)
Admission: EM | Admit: 2021-07-30 | Discharge: 2021-07-30 | Disposition: A | Discharge status: Home / Self Care | Payer: Self-pay | Attending: Emergency Medicine | Admitting: Emergency Medicine

## 2021-07-30 DIAGNOSIS — J340 Abscess, furuncle and carbuncle of nose: Secondary | ICD-10-CM | POA: Insufficient documentation

## 2021-07-30 DIAGNOSIS — Z7984 Long term (current) use of oral hypoglycemic drugs: Secondary | ICD-10-CM | POA: Insufficient documentation

## 2021-07-30 DIAGNOSIS — Z794 Long term (current) use of insulin: Secondary | ICD-10-CM | POA: Insufficient documentation

## 2021-07-30 MED ORDER — SULFAMETHOXAZOLE-TRIMETHOPRIM 800-160 MG PO TABS: 1.0000 | ORAL_TABLET | Freq: Two times a day (BID) | ORAL | 0 refills | Status: DC

## 2021-07-30 MED ORDER — MUPIROCIN CALCIUM 2 % NA OINT: TOPICAL_OINTMENT | NASAL | 0 refills | Status: DC

## 2021-07-30 MED ORDER — LIDOCAINE HCL (PF) 1 % IJ SOLN
5.0000 mL | Freq: Once | INTRAMUSCULAR | Status: AC
  Administered 2021-07-30: 5 mL
  Filled 2021-07-30: qty 30

## 2021-07-30 MED ORDER — SULFAMETHOXAZOLE-TRIMETHOPRIM 800-160 MG PO TABS
1.0000 | ORAL_TABLET | Freq: Two times a day (BID) | ORAL | 0 refills | Status: AC
Start: 2021-07-30 — End: 2021-08-06

## 2021-07-30 MED ORDER — LIDOCAINE-EPINEPHRINE-TETRACAINE (LET) TOPICAL GEL
3.0000 mL | Freq: Once | TOPICAL | Status: AC
  Administered 2021-07-30: 3 mL via TOPICAL
  Filled 2021-07-30: qty 3

## 2021-07-30 NOTE — ED Triage Notes (Signed)
Pt reports noticing a pimple-like bump to his nose that has become larger and tender over the past few days.

## 2021-07-30 NOTE — ED Provider Notes (Signed)
[facility] DEPT Provider Note   CSN: [id number] Arrival date & time: 07/30/21  0815     History  Chief Complaint  Patient presents with   Abscess    Zachary Schneider is a 51 y.o. male.  Patient presents chief complaint of an abscess and swelling and tenderness and pain inside the right nostril.  He has noticed this over the last 3 to 4 days.  Denies fevers or cough or vomiting or diarrhea.  He been using 1 compresses to the area without significant improvement and presents to the ER.      Home Medications Prior to Admission medications   Medication Sig Start Date End Date Taking? Authorizing Provider  mupirocin nasal ointment (BACTROBAN) 2 % Apply in each nostril daily 07/30/21  Yes Rangely, Greggory Brandy, MD  sulfamethoxazole-trimethoprim (BACTRIM DS) 800-160 MG tablet Take 1 tablet by mouth 2 (two) times daily for 7 days. 07/30/21 08/06/21 Yes Luna Fuse, MD  insulin aspart protamine- aspart (NOVOLOG MIX 70/30) (70-30) 100 UNIT/ML injection Inject 0.75 mLs (75 Units total) into the skin 2 (two) times daily with a meal. 12/20/17   Tat, Shanon Brow, MD  metFORMIN (GLUCOPHAGE) 500 MG tablet Take 1 tablet (500 mg total) by mouth daily with breakfast. 12/20/17   Tat, Shanon Brow, MD  silver sulfADIAZINE (SILVADENE) 1 % cream Apply 1 application topically daily. Apply to foot daily 12/19/17   Tat, Shanon Brow, MD      Allergies    Bee venom and Mushroom extract complex    Review of Systems   Review of Systems  Constitutional:  Negative for fever.  HENT:  Negative for ear pain and sore throat.   Eyes:  Negative for pain.  Respiratory:  Negative for cough.   Cardiovascular:  Negative for chest pain.  Gastrointestinal:  Negative for abdominal pain.  Genitourinary:  Negative for flank pain.  Musculoskeletal:  Negative for back pain.  Skin:  Negative for color change and rash.  Neurological:  Negative for syncope.  All other systems reviewed and are negative.  Physical  Exam Updated Vital Signs BP (!) 166/94    Pulse 78    Temp 98.3 F (36.8 C) (Oral)    Resp 16    Ht 6\' 1"  (1.854 m)    Wt 108.9 kg    SpO2 94%    BMI 31.66 kg/m  Physical Exam Constitutional:      Appearance: He is well-developed.  HENT:     Head: Normocephalic.     Nose: Nose normal.     Comments: Medial aspect of the right nostril has tenderness and swelling questionable fluctuance. Eyes:     Extraocular Movements: Extraocular movements intact.  Cardiovascular:     Rate and Rhythm: Normal rate.  Pulmonary:     Effort: Pulmonary effort is normal.  Skin:    Coloration: Skin is not jaundiced.  Neurological:     Mental Status: He is alert. Mental status is at baseline.    ED Results / Procedures / Treatments   Labs (all labs ordered are listed, but only abnormal results are displayed) Labs Reviewed - No data to display  EKG None  Radiology No results found.  Procedures .Marland KitchenIncision and Drainage  Date/Time: 07/30/2021 11:31 AM Performed by: Luna Fuse, MD Authorized by: Luna Fuse, MD   Comments:     Needle aspiration of possible abscess attempted.  Topical let applied for 45 minutes.  Subsequently 25-gauge needle used to instill 0.5 cc of  lidocaine at area of maximal fluctuance.  18-gauge needle used to attempt aspiration.  Once from inside the nose and the second time from underneath the lip.  Approximately 0.25 cc of white purulent material aspirated.    Medications Ordered in ED Medications  lidocaine-EPINEPHrine-tetracaine (LET) topical gel (3 mLs Topical Given 07/30/21 1036)  lidocaine (PF) (XYLOCAINE) 1 % injection 5 mL (5 mLs Infiltration Given 07/30/21 1120)    ED Course/ Medical Decision Making/ A&P                           Medical Decision Making Risk Prescription drug management.   Patient will be given prescription of antibiotics, advised outpatient follow-up with ENT and to call to make an appointment tomorrow.  Advised immediate return for  fevers increased welling increased pain or any additional concerns.        Final Clinical Impression(s) / ED Diagnoses Final diagnoses:  Nasal septal abscess    Rx / DC Orders ED Discharge Orders          Ordered    mupirocin nasal ointment (BACTROBAN) 2 %        07/30/21 1134    sulfamethoxazole-trimethoprim (BACTRIM DS) 800-160 MG tablet  2 times daily        07/30/21 1134              Luna Fuse, MD 07/30/21 1134

## 2021-07-30 NOTE — Discharge Instructions (Addendum)
Call your primary care doctor or specialist as discussed in the next 2-3 days.   Return immediately back to the ER if:  Your symptoms worsen within the next 12-24 hours. You develop new symptoms such as new fevers, persistent vomiting, new pain, shortness of breath, or new weakness or numbness, or if you have any other concerns.  

## 2021-09-18 ENCOUNTER — Encounter (HOSPITAL_COMMUNITY): Payer: Self-pay

## 2021-09-18 ENCOUNTER — Emergency Department (HOSPITAL_COMMUNITY)
Admission: EM | Admit: 2021-09-18 | Discharge: 2021-09-18 | Discharge status: Against Medical Advice | Payer: Self-pay | Attending: Emergency Medicine | Admitting: Emergency Medicine

## 2021-09-18 DIAGNOSIS — X58XXXA Exposure to other specified factors, initial encounter: Secondary | ICD-10-CM | POA: Insufficient documentation

## 2021-09-18 DIAGNOSIS — Z5321 Procedure and treatment not carried out due to patient leaving prior to being seen by health care provider: Secondary | ICD-10-CM | POA: Insufficient documentation

## 2021-09-18 DIAGNOSIS — S0093XA Contusion of unspecified part of head, initial encounter: Secondary | ICD-10-CM | POA: Insufficient documentation

## 2021-09-18 DIAGNOSIS — Z8547 Personal history of malignant neoplasm of testis: Secondary | ICD-10-CM | POA: Insufficient documentation

## 2021-09-18 NOTE — ED Triage Notes (Signed)
Pt presents with c/o urinary concerns. Pt reports that there is a bruise on the head of his penis, no pain. Pt reports an odd feeling when he urinates. Pt reports a hx of testicular cancer. ?

## 2022-02-28 ENCOUNTER — Ambulatory Visit: Payer: Self-pay | Admitting: Urology

## 2022-05-20 ENCOUNTER — Emergency Department (HOSPITAL_COMMUNITY)
Admission: EM | Admit: 2022-05-20 | Discharge: 2022-05-20 | Disposition: A | Discharge status: Home / Self Care | Payer: Self-pay | Attending: Emergency Medicine | Admitting: Emergency Medicine

## 2022-05-20 ENCOUNTER — Emergency Department (HOSPITAL_COMMUNITY): Payer: Self-pay

## 2022-05-20 ENCOUNTER — Other Ambulatory Visit: Payer: Self-pay

## 2022-05-20 DIAGNOSIS — Z7984 Long term (current) use of oral hypoglycemic drugs: Secondary | ICD-10-CM | POA: Insufficient documentation

## 2022-05-20 DIAGNOSIS — Z1152 Encounter for screening for COVID-19: Secondary | ICD-10-CM | POA: Insufficient documentation

## 2022-05-20 DIAGNOSIS — Z794 Long term (current) use of insulin: Secondary | ICD-10-CM | POA: Insufficient documentation

## 2022-05-20 DIAGNOSIS — L539 Erythematous condition, unspecified: Secondary | ICD-10-CM | POA: Insufficient documentation

## 2022-05-20 DIAGNOSIS — R0981 Nasal congestion: Secondary | ICD-10-CM

## 2022-05-20 DIAGNOSIS — E119 Type 2 diabetes mellitus without complications: Secondary | ICD-10-CM | POA: Insufficient documentation

## 2022-05-20 DIAGNOSIS — J069 Acute upper respiratory infection, unspecified: Secondary | ICD-10-CM | POA: Insufficient documentation

## 2022-05-20 LAB — RESP PANEL BY RT-PCR (FLU A&B, COVID) ARPGX2
Influenza A by PCR: NEGATIVE
Influenza B by PCR: NEGATIVE
SARS Coronavirus 2 by RT PCR: NEGATIVE

## 2022-05-20 MED ORDER — BENZONATATE 100 MG PO CAPS: 100.0000 mg | ORAL_CAPSULE | Freq: Three times a day (TID) | ORAL | 0 refills | Status: DC

## 2022-05-20 NOTE — ED Triage Notes (Signed)
Pt c/o sore throat, congestion, nasal drainage, dry cough x 5 days and OTC meds are not working.

## 2022-05-20 NOTE — Discharge Instructions (Signed)
You are seen today in the emergency department due to nasal congestion, cough and sore throat.  The chest x-ray is clear for pneumonia.  Given the symptoms going on for 5 days I do not think an antibiotic would be indicated this is most likely a viral process.  Continue with your home medication, you can take 1000 mg of Tylenol up to 3 times daily as needed for body aches and fever.  Drink plenty of fluids, you can use an humidifier to help with the nasal congestion.  Follow-up with your primary resolving symptoms the next week.  Return to the ED for new or concerning symptoms.

## 2022-05-20 NOTE — ED Provider Notes (Signed)
[facility] EMERGENCY DEPARTMENT Provider Note   CSN: [id number] Arrival date & time: 05/20/22  0932     History  Chief Complaint  Patient presents with   Nasal Congestion    Zachary Schneider is a 51 y.o. male.  HPI   Patient with medical history of diabetes presents to the emergency department due to nasal congestion, sore throat and nonproductive cough x5 days.  Started after attending a social outing. Symptoms fluctuate throughout the day.  He denies any chest pain or shortness of breath.  No hemoptysis.  He has been trying NyQuil and warm tea with minimal improvement.  Home Medications Prior to Admission medications   Medication Sig Start Date End Date Taking? Authorizing Provider  benzonatate (TESSALON) 100 MG capsule Take 1 capsule (100 mg total) by mouth every 8 (eight) hours. 05/20/22  Yes Sherrill Raring, PA-C  insulin aspart protamine- aspart (NOVOLOG MIX 70/30) (70-30) 100 UNIT/ML injection Inject 0.75 mLs (75 Units total) into the skin 2 (two) times daily with a meal. 12/20/17   Tat, Shanon Brow, MD  metFORMIN (GLUCOPHAGE) 500 MG tablet Take 1 tablet (500 mg total) by mouth daily with breakfast. 12/20/17   Tat, Shanon Brow, MD  mupirocin nasal ointment (BACTROBAN) 2 % Apply in each nostril daily 07/30/21   Luna Fuse, MD  silver sulfADIAZINE (SILVADENE) 1 % cream Apply 1 application topically daily. Apply to foot daily 12/19/17   Tat, Shanon Brow, MD      Allergies    Bee venom and Mushroom extract complex    Review of Systems   Review of Systems  Physical Exam Updated Vital Signs BP (!) 163/93   Pulse 81   Temp 98.3 F (36.8 C) (Oral)   Ht '6\' 1"'$  (1.854 m)   Wt 121.8 kg   SpO2 97%   BMI 35.44 kg/m  Physical Exam Vitals and nursing note reviewed. Exam conducted with a chaperone present.  Constitutional:      Appearance: Normal appearance.  HENT:     Head: Normocephalic and atraumatic.     Nose: Rhinorrhea present.     Comments: Injected nasal turbinates bilaterally     Mouth/Throat:     Pharynx: Posterior oropharyngeal erythema present.     Comments: Mild posterior oropharynx erythema but uvula is midline, normal phonation Eyes:     General: No scleral icterus.       Right eye: No discharge.        Left eye: No discharge.     Extraocular Movements: Extraocular movements intact.     Pupils: Pupils are equal, round, and reactive to light.  Cardiovascular:     Rate and Rhythm: Normal rate and regular rhythm.     Pulses: Normal pulses.     Heart sounds: Normal heart sounds. No murmur heard.    No friction rub. No gallop.  Pulmonary:     Effort: Pulmonary effort is normal. No respiratory distress.     Breath sounds: Normal breath sounds.     Comments: Lungs clear to auscultation bilaterally, speaking complete sentences Abdominal:     General: Abdomen is flat. Bowel sounds are normal. There is no distension.     Palpations: Abdomen is soft.     Tenderness: There is no abdominal tenderness.  Skin:    General: Skin is warm and dry.     Coloration: Skin is not jaundiced.  Neurological:     Mental Status: He is alert. Mental status is at baseline.     Coordination: Coordination  normal.     ED Results / Procedures / Treatments   Labs (all labs ordered are listed, but only abnormal results are displayed) Labs Reviewed  RESP PANEL BY RT-PCR (FLU A&B, COVID) [id number]  GROUP A STREP BY PCR    EKG None  Radiology DG Chest 2 View  Result Date: 05/20/2022 CLINICAL DATA:  Cough. EXAM: CHEST - 2 VIEW COMPARISON:  October 28, 2020 FINDINGS: The heart size and mediastinal contours are within normal limits. Both lungs are clear. The visualized skeletal structures are unremarkable. IMPRESSION: No active cardiopulmonary disease. Electronically Signed   By: Abelardo Diesel M.D.   On: 05/20/2022 10:55    Procedures Procedures    Medications Ordered in ED Medications - No data to display  ED Course/ Medical Decision Making/ A&P                            Medical Decision Making Amount and/or Complexity of Data Reviewed Radiology: ordered.  Risk Prescription drug management.   Patient presents due to viral symptoms nasal congestion x5 days peer differential includes not limited to viral URI, sinusitis, pneumonia.  On exam patient is overall well-appearing, not hypoxic and not in respiratory distress.  Clear lung sounds physical exam findings are most consistent with a viral URI.  COVID and flu test are negative.  The chest x-ray does not show any signs of consolidation, acute process or PNA. I considered antibiotics but given the acuity of symptoms I think this is most likely a viral process that will resolve independently and not indicated.  We discussed conservative treatment at home to which patient verbalized understanding.          Final Clinical Impression(s) / ED Diagnoses Final diagnoses:  Nasal congestion  Viral URI with cough    Rx / DC Orders ED Discharge Orders          Ordered    benzonatate (TESSALON) 100 MG capsule  Every 8 hours        05/20/22 1116              Sherrill Raring, PA-C 05/20/22 1816    Fredia Sorrow, MD 05/23/22 [phone]

## 2022-11-12 ENCOUNTER — Other Ambulatory Visit: Payer: Self-pay

## 2022-11-12 ENCOUNTER — Emergency Department (HOSPITAL_COMMUNITY)
Admission: EM | Admit: 2022-11-12 | Discharge: 2022-11-12 | Disposition: A | Discharge status: Home / Self Care | Payer: Self-pay | Attending: Emergency Medicine | Admitting: Emergency Medicine

## 2022-11-12 ENCOUNTER — Encounter (HOSPITAL_COMMUNITY): Payer: Self-pay

## 2022-11-12 DIAGNOSIS — Z7984 Long term (current) use of oral hypoglycemic drugs: Secondary | ICD-10-CM | POA: Insufficient documentation

## 2022-11-12 DIAGNOSIS — L03012 Cellulitis of left finger: Secondary | ICD-10-CM | POA: Insufficient documentation

## 2022-11-12 DIAGNOSIS — Z794 Long term (current) use of insulin: Secondary | ICD-10-CM | POA: Insufficient documentation

## 2022-11-12 DIAGNOSIS — E119 Type 2 diabetes mellitus without complications: Secondary | ICD-10-CM | POA: Insufficient documentation

## 2022-11-12 LAB — CBG MONITORING, ED: Glucose-Capillary: 408 mg/dL — ABNORMAL HIGH (ref 70–99)

## 2022-11-12 MED ORDER — LIDOCAINE HCL (PF) 2 % IJ SOLN
INTRAMUSCULAR | Status: AC
  Filled 2022-11-12: qty 5

## 2022-11-12 MED ORDER — CEPHALEXIN 500 MG PO CAPS
500.0000 mg | ORAL_CAPSULE | Freq: Once | ORAL | Status: AC
  Administered 2022-11-12: 500 mg via ORAL
  Filled 2022-11-12: qty 1

## 2022-11-12 MED ORDER — LIDOCAINE HCL (PF) 2 % IJ SOLN
10.0000 mL | Freq: Once | INTRAMUSCULAR | Status: AC
  Administered 2022-11-12: 10 mL via INTRADERMAL

## 2022-11-12 MED ORDER — CEPHALEXIN 500 MG PO CAPS
500.0000 mg | ORAL_CAPSULE | Freq: Four times a day (QID) | ORAL | 0 refills | Status: DC
Start: 2022-11-12 — End: 2024-03-16

## 2022-11-12 NOTE — ED Triage Notes (Signed)
Patient complains of swelling to the left middle finger

## 2022-11-12 NOTE — Discharge Instructions (Signed)
Warm water soaks to your finger 3 times a day.  Elevate your hand when possible.  You may keep it bandaged for several days until the soreness improves.  The antibiotic as directed until finished.  Return to the emergency department for any new or worsening symptoms

## 2022-11-12 NOTE — ED Provider Notes (Signed)
EMERGENCY DEPARTMENT AT [facility] Provider Note   CSN: [id number] Arrival date & time: 11/12/22  4098     History  Chief Complaint  Patient presents with   Finger Injury    Zachary Schneider is a 52 y.o. male.  HPI      Zachary Schneider is a 52 y.o. male past medical history of type 2 diabetes uncontrolled, who presents to the Emergency Department complaining of pain redness and swelling to the distal aspect of the left middle finger.  Symptoms began on Thursday.  Patient admits to biting his fingernails and believes he pulled a hangnail out.  Noticed gradually worsening symptoms since that time.  Reports history of previous paronychia.  Denies any fever, chills, or red streaking of the hand or finger.  He tried to drain the area himself using a razor blade at home.    Home Medications Prior to Admission medications   Medication Sig Start Date End Date Taking? Authorizing Provider  benzonatate (TESSALON) 100 MG capsule Take 1 capsule (100 mg total) by mouth every 8 (eight) hours. 05/20/22   Theron Arista, PA-C  insulin aspart protamine- aspart (NOVOLOG MIX 70/30) (70-30) 100 UNIT/ML injection Inject 0.75 mLs (75 Units total) into the skin 2 (two) times daily with a meal. 12/20/17   Tat, Onalee Hua, MD  metFORMIN (GLUCOPHAGE) 500 MG tablet Take 1 tablet (500 mg total) by mouth daily with breakfast. 12/20/17   Tat, Onalee Hua, MD  mupirocin nasal ointment (BACTROBAN) 2 % Apply in each nostril daily 07/30/21   Cheryll Cockayne, MD  silver sulfADIAZINE (SILVADENE) 1 % cream Apply 1 application topically daily. Apply to foot daily 12/19/17   Tat, Onalee Hua, MD      Allergies    Bee venom and Mushroom extract complex    Review of Systems   Review of Systems  Constitutional:  Negative for chills and fever.  Gastrointestinal:  Negative for abdominal pain, nausea and vomiting.  Skin:  Positive for color change and wound.       Redness swelling distal left middle finger   Neurological:  Negative for dizziness and weakness.    Physical Exam Updated Vital Signs BP (!) 166/97   Pulse 98   Temp 98.2 F (36.8 C) (Oral)   Resp 16   Ht 6\' 1"  (1.854 m)   Wt 113.4 kg   SpO2 96%   BMI 32.98 kg/m  Physical Exam Vitals and nursing note reviewed.  Constitutional:      Appearance: Normal appearance.  Cardiovascular:     Rate and Rhythm: Normal rate and regular rhythm.     Pulses: Normal pulses.  Pulmonary:     Effort: Pulmonary effort is normal.     Breath sounds: Normal breath sounds.  Musculoskeletal:        General: Tenderness present. Normal range of motion.     Comments: Tenderness palpation distal aspect of the ulnar aspect of the left middle finger along the lateral nail.  Significant erythema with mild edema noted.  No lymphangitis   Skin:    General: Skin is warm.     Capillary Refill: Capillary refill takes less than 2 seconds.     Findings: Erythema present.  Neurological:     General: No focal deficit present.     Mental Status: He is alert.     Sensory: No sensory deficit.     Motor: No weakness.     ED Results / Procedures / Treatments  Labs (all labs ordered are listed, but only abnormal results are displayed) Labs Reviewed - No data to display  EKG None  Radiology No results found.  Procedures Drain paronychia  Date/Time: 11/12/2022 9:45 AM  Performed by: Pauline Aus, PA-C Authorized by: Pauline Aus, PA-C  Consent: Verbal consent obtained. Consent given by: patient Patient understanding: patient states understanding of the procedure being performed Patient identity confirmed: verbally with patient and arm band Time out: Immediately prior to procedure a "time out" was called to verify the correct patient, procedure, equipment, support staff and site/side marked as required. Preparation: Patient was prepped and draped in the usual sterile fashion. Local anesthesia used: yes Anesthesia: digital  block  Anesthesia: Local anesthesia used: yes Local Anesthetic: lidocaine 2% without epinephrine Anesthetic total: 4 mL  Sedation: Patient sedated: no  Patient tolerance: patient tolerated the procedure well with no immediate complications Comments: Successful drainage of paronychia left middle finger       Medications Ordered in ED Medications  lidocaine HCl (PF) (XYLOCAINE) 2 % injection 10 mL (has no administration in time range)    ED Course/ Medical Decision Making/ A&P                             Medical Decision Making Diabetic patient here with redness pain and swelling along the lateral aspect of the left middle finger.  Admits to biting his fingernails and believes he pulled a hangnail out.  Attempted to drain the area using a razor blade at home.  Denies any fever or chills or numbness of the hand or red streaking.  Likely paronychia, cellulitis also considered.  Patient well-appearing nontoxic. Has FROM of finger, no nail involvement. NV intact digit  Amount and/or Complexity of Data Reviewed Discussion of management or test interpretation with external provider(s): Successful drainage of paronychia.  Initial dose of antibiotics given here.  He is agreeable to treatment plan with elevation, warm water soaks, antibiotics and close outpatient follow-up.  Return precautions were discussed  Risk Prescription drug management.           Final Clinical Impression(s) / ED Diagnoses Final diagnoses:  Paronychia of finger of left hand    Rx / DC Orders ED Discharge Orders     None         Pauline Aus, PA-C 11/15/22 1158    Jacalyn Lefevre, MD 11/16/22 [phone]

## 2022-12-24 ENCOUNTER — Emergency Department (HOSPITAL_COMMUNITY)
Admission: EM | Admit: 2022-12-24 | Discharge: 2022-12-24 | Disposition: A | Discharge status: Home / Self Care | Payer: Self-pay | Attending: Emergency Medicine | Admitting: Emergency Medicine

## 2022-12-24 ENCOUNTER — Other Ambulatory Visit: Payer: Self-pay

## 2022-12-24 DIAGNOSIS — Z794 Long term (current) use of insulin: Secondary | ICD-10-CM | POA: Insufficient documentation

## 2022-12-24 DIAGNOSIS — Z202 Contact with and (suspected) exposure to infections with a predominantly sexual mode of transmission: Secondary | ICD-10-CM | POA: Insufficient documentation

## 2022-12-24 DIAGNOSIS — Z711 Person with feared health complaint in whom no diagnosis is made: Secondary | ICD-10-CM

## 2022-12-24 DIAGNOSIS — Z7984 Long term (current) use of oral hypoglycemic drugs: Secondary | ICD-10-CM | POA: Insufficient documentation

## 2022-12-24 LAB — HIV ANTIBODY (ROUTINE TESTING W REFLEX): HIV Screen 4th Generation wRfx: NONREACTIVE

## 2022-12-24 MED ORDER — ACYCLOVIR 400 MG PO TABS
400.0000 mg | ORAL_TABLET | Freq: Four times a day (QID) | ORAL | 0 refills | Status: AC | PRN
Start: 2022-12-24 — End: ?

## 2022-12-24 NOTE — Discharge Instructions (Signed)
Sent HSV-1 and HSV-2 antibody test, RPR for syphilis and HIV antibody.  Please follow-up on results on MyChart.  Recommend abstaining from sex until you have the results.  Please follow-up with your primary care doctor.

## 2022-12-24 NOTE — ED Provider Notes (Cosign Needed Addendum)
[place] EMERGENCY DEPARTMENT AT [facility] Provider Note   CSN: [id number] Arrival date & time: 12/24/22  2130     History  No chief complaint on file.  HPI Zachary Schneider is a 52 y.o. male presenting for STI concern.  States he recently went to [facility] and had an STI panel drawn.  States he and his girlfriend are not sexually active but they wanted to "get things checked out" before they became sexually active as his girlfriend is immunocompromise.  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  Patient presenting today for recheck of his labs to confirm the diagnosis.  Patient does mention that he does get "cold sores" occasionally around his lips.  Denies genital rash, penile pain or abnormal penile discharge.  HPI     Home Medications Prior to Admission medications   Medication Sig Start Date End Date Taking? Authorizing Provider  acyclovir (ZOVIRAX) 400 MG tablet Take 1 tablet (400 mg total) by mouth 4 (four) times daily as needed. Please take if you start to have a cold sore 12/24/22  Yes Gareth Eagle, PA-C  benzonatate (TESSALON) 100 MG capsule Take 1 capsule (100 mg total) by mouth every 8 (eight) hours. 05/20/22   Theron Arista, PA-C  cephALEXin (KEFLEX) 500 MG capsule Take 1 capsule (500 mg total) by mouth 4 (four) times daily. 11/12/22   Triplett, Tammy, PA-C  insulin aspart protamine- aspart (NOVOLOG MIX 70/30) (70-30) 100 UNIT/ML injection Inject 0.75 mLs (75 Units total) into the skin 2 (two) times daily with a meal. 12/20/17   Tat, Onalee Hua, MD  metFORMIN (GLUCOPHAGE) 500 MG tablet Take 1 tablet (500 mg total) by mouth daily with breakfast. 12/20/17   Tat, Onalee Hua, MD  mupirocin nasal ointment (BACTROBAN) 2 % Apply in each nostril daily 07/30/21   Cheryll Cockayne, MD  silver sulfADIAZINE (SILVADENE) 1 % cream Apply 1 application topically daily. Apply to foot daily 12/19/17   Tat, Onalee Hua, MD       Allergies    Bee venom and Mushroom extract complex    Review of Systems   See HPI for pertinent positives   Physical Exam   Vitals:   12/24/22 1000 12/24/22 1001  BP: (!) 146/83   Pulse: 95   Resp:  17  Temp:  98 F (36.7 C)  SpO2: 97%     CONSTITUTIONAL:  well-appearing, NAD NEURO:  Alert and oriented x 3, CN 3-12 grossly intact EYES:  eyes equal and reactive ENT/NECK:  Supple, no stridor  CARDIO:  appears well-perfused  PULM:  No respiratory distress GI/GU:  non-distended MSK/SPINE:  No gross deformities, no edema, moves all extremities  SKIN:  no rash, atraumatic   *Additional and/or pertinent findings included in MDM below    ED Results / Procedures / Treatments   Labs (all labs ordered are listed, but only abnormal results are displayed) Labs Reviewed  HSV 1 ANTIBODY, IGG  HSV 2 ANTIBODY, IGG  RPR  HIV ANTIBODY (ROUTINE TESTING W REFLEX)    EKG None  Radiology No results found.  Procedures Procedures    Medications Ordered in ED Medications - No data to display  ED Course/ Medical Decision Making/ A&P                             Medical Decision Making Amount and/or Complexity of Data Reviewed Labs:  ordered.  Risk Prescription drug management.   52 year old male who is well-appearing presenting for STI concern.  Exam is unremarkable.  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  Recent HSV 1 and HSV 2 antibody test with RPR and HIV antibody.  Sent acyclovir to his pharmacy.  Advised to follow-up with his PCP.  Vital stable.  Discharged home in good condition.        Final Clinical Impression(s) / ED Diagnoses Final diagnoses:  Concern about STI in male without diagnosis    Rx / DC Orders ED Discharge Orders          Ordered    acyclovir (ZOVIRAX) 400 MG tablet  4 times daily PRN        12/24/22 1048              Gareth Eagle, PA-C 12/24/22 1042    Gareth Eagle, PA-C 12/24/22 1048    Gloris Manchester, MD 12/25/22 [phone]

## 2022-12-24 NOTE — ED Triage Notes (Signed)
Pt did a online std  test and 1 of his tests showed positive. Pt has no symptoms.

## 2022-12-25 LAB — HSV 2 ANTIBODY, IGG: HSV 2 Glycoprotein G Ab, IgG: >23.6 index — ABNORMAL HIGH (ref 0.00–0.90)

## 2022-12-25 LAB — RPR: RPR Ser Ql: NONREACTIVE

## 2022-12-25 LAB — HSV 1 ANTIBODY, IGG: HSV 1 Glycoprotein G Ab, IgG: 14.3 index — ABNORMAL HIGH (ref 0.00–0.90)

## 2023-03-29 ENCOUNTER — Telehealth: Payer: Self-pay

## 2023-03-29 NOTE — Telephone Encounter (Signed)
Lvm- Asking pt to call back

## 2023-04-15 ENCOUNTER — Encounter (HOSPITAL_COMMUNITY): Payer: Self-pay | Admitting: *Deleted

## 2023-04-15 ENCOUNTER — Emergency Department (HOSPITAL_COMMUNITY)
Admission: EM | Admit: 2023-04-15 | Discharge: 2023-04-15 | Disposition: A | Discharge status: Home / Self Care | Payer: Self-pay | Attending: Emergency Medicine | Admitting: Emergency Medicine

## 2023-04-15 ENCOUNTER — Other Ambulatory Visit: Payer: Self-pay

## 2023-04-15 DIAGNOSIS — Z794 Long term (current) use of insulin: Secondary | ICD-10-CM | POA: Insufficient documentation

## 2023-04-15 DIAGNOSIS — R739 Hyperglycemia, unspecified: Secondary | ICD-10-CM

## 2023-04-15 DIAGNOSIS — Z7984 Long term (current) use of oral hypoglycemic drugs: Secondary | ICD-10-CM | POA: Insufficient documentation

## 2023-04-15 DIAGNOSIS — E1165 Type 2 diabetes mellitus with hyperglycemia: Secondary | ICD-10-CM | POA: Insufficient documentation

## 2023-04-15 LAB — CBC WITH DIFFERENTIAL/PLATELET
Abs Immature Granulocytes: 0.11 10*3/uL — ABNORMAL HIGH (ref 0.00–0.07)
Basophils Absolute: 0 10*3/uL (ref 0.0–0.1)
Basophils Relative: 0 %
Eosinophils Absolute: 0.1 10*3/uL (ref 0.0–0.5)
Eosinophils Relative: 1 %
HCT: 43.3 % (ref 39.0–52.0)
Hemoglobin: 13.6 g/dL (ref 13.0–17.0)
Immature Granulocytes: 1 %
Lymphocytes Relative: 20 %
Lymphs Abs: 2.1 10*3/uL (ref 0.7–4.0)
MCH: 26.8 pg (ref 26.0–34.0)
MCHC: 31.4 g/dL (ref 30.0–36.0)
MCV: 85.4 fL (ref 80.0–100.0)
Monocytes Absolute: 0.7 10*3/uL (ref 0.1–1.0)
Monocytes Relative: 7 %
Neutro Abs: 7.4 10*3/uL (ref 1.7–7.7)
Neutrophils Relative %: 71 %
Platelets: 246 10*3/uL (ref 150–400)
RBC: 5.07 MIL/uL (ref 4.22–5.81)
RDW: 14.5 % (ref 11.5–15.5)
WBC: 10.4 10*3/uL (ref 4.0–10.5)
nRBC: 0 % (ref 0.0–0.2)

## 2023-04-15 LAB — URINALYSIS, ROUTINE W REFLEX MICROSCOPIC
Bacteria, UA: NONE SEEN
Bilirubin Urine: NEGATIVE
Glucose, UA: >=500 mg/dL — AB
Hgb urine dipstick: NEGATIVE
Ketones, ur: NEGATIVE mg/dL
Leukocytes,Ua: NEGATIVE
Nitrite: NEGATIVE
Protein, ur: NEGATIVE mg/dL
Specific Gravity, Urine: 1.026 (ref 1.005–1.030)
pH: 5 (ref 5.0–8.0)

## 2023-04-15 LAB — BASIC METABOLIC PANEL
Anion gap: 7 (ref 5–15)
BUN: 8 mg/dL (ref 6–20)
CO2: 25 mmol/L (ref 22–32)
Calcium: 8 mg/dL — ABNORMAL LOW (ref 8.9–10.3)
Chloride: 104 mmol/L (ref 98–111)
Creatinine, Ser: 0.56 mg/dL — ABNORMAL LOW (ref 0.61–1.24)
GFR, Estimated: >60 mL/min (ref >60)
Glucose, Bld: 253 mg/dL — ABNORMAL HIGH (ref 70–99)
Potassium: 3.6 mmol/L (ref 3.5–5.1)
Sodium: 136 mmol/L (ref 135–145)

## 2023-04-15 LAB — CBG MONITORING, ED: Glucose-Capillary: 248 mg/dL — ABNORMAL HIGH (ref 70–99)

## 2023-04-15 MED ORDER — METFORMIN HCL 500 MG PO TABS
500.0000 mg | ORAL_TABLET | Freq: Every day | ORAL | 0 refills | Status: AC
Start: 2023-04-15 — End: ?

## 2023-04-15 MED ORDER — INSULIN ASPART PROT & ASPART (70-30 MIX) 100 UNIT/ML ~~LOC~~ SUSP
75.0000 [IU] | Freq: Two times a day (BID) | SUBCUTANEOUS | 1 refills | Status: AC
Start: 2023-04-15 — End: ?

## 2023-04-15 NOTE — ED Triage Notes (Addendum)
Pt comes in requesting "blood sugar check" so he can go back to work. Pt reports he was sent home from work Friday due to his blood sugar being above 500. He was told he needed to have it rechecked and under control before he could return to work. He reports he takes Novolog 70/30 and sometimes takes Lantus but "I am running out". He doesn't have insurance or a primary doctor so he get his Novolog 70/30 from [facility] without a prescription because it's the most affordable. Reports his blood sugar has been running between 109-414 over the weekend. Denies polyuria, polydipsia, headache, n/v. CBG in triage 248.

## 2023-04-15 NOTE — Discharge Instructions (Signed)
Your blood sugar today was elevated.  It is important that you keep a close check on your blood sugar levels.  Take your diabetes medications daily as directed.  You will need to follow-up with your primary care provider or with the health department for further management.  Return to the emergency department for any new or worsening symptoms.

## 2023-04-15 NOTE — ED Provider Notes (Signed)
[place] EMERGENCY DEPARTMENT AT [facility] Provider Note   CSN: [id number] Arrival date & time: 04/15/23  0730     History  Chief Complaint  Patient presents with   Hyperglycemia    [name] is a 52 y.o. male.   Hyperglycemia Associated symptoms: no abdominal pain, no chest pain, no confusion, no dizziness, no dysuria, no fever, no increased thirst, no nausea, no polyuria, no shortness of breath, no vomiting and no weakness        Zachary Schneider is a 52 y.o. male with past medical history of type 2 diabetes, who presents to the Emergency Department requesting recheck of his blood sugar so that he may return to work.  States that Friday he had an episode at work where his blood sugar was greater than 500.  He was told he could not return to work until he was evaluated for this.  States that over the last several weeks his sugars have been up and down from the 100s up to the 500s.  Denies any dietary changes.  States he is running low on his Lantus and has ran out of his NovoLog 70/30.  He currently does not have a primary care provider.  He does note intermittent episodes of difficulty with recall and speech delays.  These episodes are typically brief. Currently he denies any symptoms.  Denies any recent illness, recent steroid use or antibiotic use.  Home Medications Prior to Admission medications   Medication Sig Start Date End Date Taking? Authorizing Provider  acyclovir (ZOVIRAX) 400 MG tablet Take 1 tablet (400 mg total) by mouth 4 (four) times daily as needed. Please take if you start to have a cold sore 12/24/22   Gareth Eagle, PA-C  benzonatate (TESSALON) 100 MG capsule Take 1 capsule (100 mg total) by mouth every 8 (eight) hours. 05/20/22   Theron Arista, PA-C  cephALEXin (KEFLEX) 500 MG capsule Take 1 capsule (500 mg total) by mouth 4 (four) times daily. 11/12/22   Traci Plemons, PA-C  insulin aspart protamine- aspart (NOVOLOG MIX 70/30) (70-30) 100  UNIT/ML injection Inject 0.75 mLs (75 Units total) into the skin 2 (two) times daily with a meal. 12/20/17   Tat, Onalee Hua, MD  metFORMIN (GLUCOPHAGE) 500 MG tablet Take 1 tablet (500 mg total) by mouth daily with breakfast. 12/20/17   Tat, Onalee Hua, MD  mupirocin nasal ointment (BACTROBAN) 2 % Apply in each nostril daily 07/30/21   Cheryll Cockayne, MD  silver sulfADIAZINE (SILVADENE) 1 % cream Apply 1 application topically daily. Apply to foot daily 12/19/17   Tat, Onalee Hua, MD      Allergies    Bee venom and Mushroom extract complex    Review of Systems   Review of Systems  Constitutional:  Negative for appetite change, chills and fever.  Respiratory:  Negative for shortness of breath.   Cardiovascular:  Negative for chest pain.  Gastrointestinal:  Negative for abdominal pain, diarrhea, nausea and vomiting.  Endocrine: Negative for polydipsia, polyphagia and polyuria.  Genitourinary:  Negative for difficulty urinating, dysuria and flank pain.  Musculoskeletal:  Negative for arthralgias and myalgias.  Neurological:  Negative for dizziness, syncope, weakness and headaches.  Psychiatric/Behavioral:  Negative for confusion.     Physical Exam Updated Vital Signs BP (!) 151/87 (BP Location: Right Arm)   Pulse 61   Temp 98.1 F (36.7 C) (Oral)   Resp 16   Ht 6\' 1"  (1.854 m)   Wt 113.4 kg  SpO2 95%   BMI 32.98 kg/m  Physical Exam Vitals and nursing note reviewed.  Constitutional:      General: He is not in acute distress.    Appearance: Normal appearance. He is not ill-appearing or toxic-appearing.  Eyes:     Conjunctiva/sclera: Conjunctivae normal.     Pupils: Pupils are equal, round, and reactive to light.  Cardiovascular:     Rate and Rhythm: Normal rate and regular rhythm.     Pulses: Normal pulses.  Pulmonary:     Effort: Pulmonary effort is normal.  Abdominal:     General: There is no distension.     Palpations: Abdomen is soft.     Tenderness: There is no abdominal tenderness.   Musculoskeletal:        General: Normal range of motion.     Cervical back: Normal range of motion.     Right lower leg: No edema.     Left lower leg: No edema.  Skin:    General: Skin is warm.     Capillary Refill: Capillary refill takes less than 2 seconds.  Neurological:     General: No focal deficit present.     Mental Status: He is alert and oriented to person, place, and time.     Sensory: No sensory deficit.     Motor: No weakness.     ED Results / Procedures / Treatments   Labs (all labs ordered are listed, but only abnormal results are displayed) Labs Reviewed  CBC WITH DIFFERENTIAL/PLATELET - Abnormal; Notable for the following components:      Result Value   Abs Immature Granulocytes 0.11 (*)    All other components within normal limits  BASIC METABOLIC PANEL - Abnormal; Notable for the following components:   Glucose, Bld 253 (*)    Creatinine, Ser 0.56 (*)    Calcium 8.0 (*)    All other components within normal limits  URINALYSIS, ROUTINE W REFLEX MICROSCOPIC - Abnormal; Notable for the following components:   Glucose, UA >=500 (*)    All other components within normal limits  CBG MONITORING, ED - Abnormal; Notable for the following components:   Glucose-Capillary 248 (*)    All other components within normal limits    EKG None  Radiology No results found.  Procedures Procedures    Medications Ordered in ED Medications - No data to display  ED Course/ Medical Decision Making/ A&P                                 Medical Decision Making Patient here with history of type 2 diabetes, states that sugars have been running high remittent Nedra Hai for the last several weeks.  Has ran out of his NovoLog 70/30 and is running low on his Lantus.  Does not currently have PCP denies any recent illness or medication changes.  Differential would include but not limited to acute infection, metabolic imbalance, poor control of his diabetes.  No concerning symptoms on  exam for DKA or HHS  Amount and/or Complexity of Data Reviewed Labs: ordered.    Details: Labs interpreted by me, no evidence of leukocytosis, hemoglobin unremarkable.  Chemistries show blood glucose of 253 anion gap reassuring.  Bicarb unremarkable.  Urinalysis shows glucosuria but otherwise no evidence of infection Discussion of management or test interpretation with external provider(s): Patient here for hyperglycemia, no evidence of DKA or HHS.  Vital signs reassuring, well appearing.  Mildly hypertensive.  Recently ran out of his medication.  Will provide short refill with understanding that he will establish primary care with health department or local PCP.  Appears appropriate for discharge home.  All questions were answered.  Risk Prescription drug management.           Final Clinical Impression(s) / ED Diagnoses Final diagnoses:  Hyperglycemia    Rx / DC Orders ED Discharge Orders     None         Pauline Aus, PA-C 04/19/23 1212    Tanda Rockers A, DO 04/21/23 [phone]

## 2023-08-31 ENCOUNTER — Emergency Department (HOSPITAL_COMMUNITY): Payer: Self-pay

## 2023-08-31 ENCOUNTER — Other Ambulatory Visit: Payer: Self-pay

## 2023-08-31 ENCOUNTER — Encounter (HOSPITAL_COMMUNITY): Payer: Self-pay | Admitting: *Deleted

## 2023-08-31 ENCOUNTER — Emergency Department (HOSPITAL_COMMUNITY)
Admission: EM | Admit: 2023-08-31 | Discharge: 2023-08-31 | Disposition: A | Discharge status: Home / Self Care | Payer: Self-pay | Attending: Emergency Medicine | Admitting: Emergency Medicine

## 2023-08-31 DIAGNOSIS — R739 Hyperglycemia, unspecified: Secondary | ICD-10-CM

## 2023-08-31 DIAGNOSIS — R410 Disorientation, unspecified: Secondary | ICD-10-CM | POA: Insufficient documentation

## 2023-08-31 DIAGNOSIS — Z7984 Long term (current) use of oral hypoglycemic drugs: Secondary | ICD-10-CM | POA: Insufficient documentation

## 2023-08-31 DIAGNOSIS — W19XXXA Unspecified fall, initial encounter: Secondary | ICD-10-CM | POA: Insufficient documentation

## 2023-08-31 DIAGNOSIS — Y9301 Activity, walking, marching and hiking: Secondary | ICD-10-CM | POA: Insufficient documentation

## 2023-08-31 DIAGNOSIS — S8002XA Contusion of left knee, initial encounter: Secondary | ICD-10-CM

## 2023-08-31 DIAGNOSIS — E119 Type 2 diabetes mellitus without complications: Secondary | ICD-10-CM | POA: Insufficient documentation

## 2023-08-31 DIAGNOSIS — Z794 Long term (current) use of insulin: Secondary | ICD-10-CM | POA: Insufficient documentation

## 2023-08-31 DIAGNOSIS — Y92009 Unspecified place in unspecified non-institutional (private) residence as the place of occurrence of the external cause: Secondary | ICD-10-CM | POA: Insufficient documentation

## 2023-08-31 DIAGNOSIS — M25562 Pain in left knee: Secondary | ICD-10-CM | POA: Insufficient documentation

## 2023-08-31 LAB — URINALYSIS, ROUTINE W REFLEX MICROSCOPIC
Bacteria, UA: NONE SEEN
Bilirubin Urine: NEGATIVE
Glucose, UA: >=500 mg/dL — AB
Ketones, ur: NEGATIVE mg/dL
Leukocytes,Ua: NEGATIVE
Nitrite: NEGATIVE
Protein, ur: NEGATIVE mg/dL
Specific Gravity, Urine: 1.029 (ref 1.005–1.030)
pH: 6 (ref 5.0–8.0)

## 2023-08-31 LAB — HEPATIC FUNCTION PANEL
ALT: 17 U/L (ref 0–44)
AST: 12 U/L — ABNORMAL LOW (ref 15–41)
Albumin: 3.3 g/dL — ABNORMAL LOW (ref 3.5–5.0)
Alkaline Phosphatase: 91 U/L (ref 38–126)
Bilirubin, Direct: 0.1 mg/dL (ref 0.0–0.2)
Indirect Bilirubin: 0.6 mg/dL (ref 0.3–0.9)
Total Bilirubin: 0.7 mg/dL (ref 0.0–1.2)
Total Protein: 7 g/dL (ref 6.5–8.1)

## 2023-08-31 LAB — BASIC METABOLIC PANEL
Anion gap: 12 (ref 5–15)
BUN: 18 mg/dL (ref 6–20)
CO2: 24 mmol/L (ref 22–32)
Calcium: 8.8 mg/dL — ABNORMAL LOW (ref 8.9–10.3)
Chloride: 97 mmol/L — ABNORMAL LOW (ref 98–111)
Creatinine, Ser: 0.7 mg/dL (ref 0.61–1.24)
GFR, Estimated: >60 mL/min (ref >60)
Glucose, Bld: 417 mg/dL — ABNORMAL HIGH (ref 70–99)
Potassium: 3.5 mmol/L (ref 3.5–5.1)
Sodium: 133 mmol/L — ABNORMAL LOW (ref 135–145)

## 2023-08-31 LAB — CBC
HCT: 46.2 % (ref 39.0–52.0)
Hemoglobin: 15.2 g/dL (ref 13.0–17.0)
MCH: 27.3 pg (ref 26.0–34.0)
MCHC: 32.9 g/dL (ref 30.0–36.0)
MCV: 82.9 fL (ref 80.0–100.0)
Platelets: 301 10*3/uL (ref 150–400)
RBC: 5.57 MIL/uL (ref 4.22–5.81)
RDW: 14.5 % (ref 11.5–15.5)
WBC: 10.9 10*3/uL — ABNORMAL HIGH (ref 4.0–10.5)
nRBC: 0 % (ref 0.0–0.2)

## 2023-08-31 LAB — CBG MONITORING, ED
Glucose-Capillary: 392 mg/dL — ABNORMAL HIGH (ref 70–99)
Glucose-Capillary: 376 mg/dL — ABNORMAL HIGH (ref 70–99)
Glucose-Capillary: 288 mg/dL — ABNORMAL HIGH (ref 70–99)

## 2023-08-31 LAB — BETA-HYDROXYBUTYRIC ACID: Beta-Hydroxybutyric Acid: 0.13 mmol/L (ref 0.05–0.27)

## 2023-08-31 LAB — TROPONIN I (HIGH SENSITIVITY): Troponin I (High Sensitivity): 3 ng/L (ref <18)

## 2023-08-31 MED ORDER — LACTATED RINGERS IV BOLUS
1000.0000 mL | Freq: Once | INTRAVENOUS | Status: AC
  Administered 2023-08-31: 1000 mL via INTRAVENOUS

## 2023-08-31 MED ORDER — SODIUM CHLORIDE 0.9 % IV BOLUS
1000.0000 mL | Freq: Once | INTRAVENOUS | Status: AC
  Administered 2023-08-31: 1000 mL via INTRAVENOUS

## 2023-08-31 MED ORDER — POTASSIUM CHLORIDE CRYS ER 20 MEQ PO TBCR
40.0000 meq | EXTENDED_RELEASE_TABLET | Freq: Once | ORAL | Status: AC
  Administered 2023-08-31: 40 meq via ORAL
  Filled 2023-08-31: qty 2

## 2023-08-31 MED ORDER — INSULIN ASPART 100 UNIT/ML IJ SOLN
10.0000 [IU] | Freq: Once | INTRAMUSCULAR | Status: AC
  Administered 2023-08-31: 10 [IU] via SUBCUTANEOUS
  Filled 2023-08-31: qty 1

## 2023-08-31 NOTE — Discharge Instructions (Signed)
 He is continue to monitor your blood sugar closely at home.  Follow-up closely with a primary care doctor on an outpatient basis.  Return to emergency department immediately for any new or worsening symptoms.  [facility] Primary [facility] Doctor List    Syliva Overman, MD. Specialty: [facility] Medicine Contact information: [address]  [place] [address]  [phone]   Lilyan Punt, MD. Specialty: [facility] Medicine Contact information: [address]  [place] [address]  [phone]   Avon Gully, MD Specialty: Internal Medicine Contact information: [address] [place] [address]  [phone]   Catalina Pizza, MD. Specialty: Internal Medicine Contact information: [address]  [place] [address]  [phone]    [facility] (Dr. Selena Batten) Specialty: Family Medicine Contact information: [address]  [place] [address]  [phone]   John Giovanni, MD. Specialty: [facility] Medicine Contact information: [address]  [address]  [place] [address]  [phone]   Carylon Perches, MD. Specialty: Internal Medicine Contact information: [address]  [address]  [place] [address]  [phone]   [facility] Family Medicine: [address] [phone]  [facility], Family medicine [address]  [phone]  [facility] [address] [place], [place] [phone]  [facility]: [address] [phone]    [facility]  [address] [place], [place] [address] [phone]  Services [facility] offers a variety of basic health services.  Services include but are not limited to: Blood pressure checks  Heart rate checks  Blood sugar checks  Urine analysis  Rapid strep tests  Pregnancy tests.  Health education and referrals  People needing more complex services will be directed to a physician  online. Using these virtual visits, doctors can evaluate and prescribe medicine and treatments. There will be no medication on-site, though [facility] will help patients fill their prescriptions at little to no cost.   For More information please go to: [email or web address]  Allergy and Asthma:    [address] [place] [phone]  Urology:  [address]  [place] [phone]  [facility]  [address] [place], [place] [phone]  Orthopedics   [address] [place], [place] [phone]  Endocrinology  [address] [place], [place] [phone]  Podiatry: [facility] [phone]

## 2023-08-31 NOTE — ED Provider Notes (Signed)
 [place] EMERGENCY DEPARTMENT AT [facility] Provider Note   CSN: [id number] Arrival date & time: 08/31/23  1722     History  Chief Complaint  Patient presents with   Marletta Lor    Zachary Schneider is a 53 y.o. male.  Patient is a 53 year old male who presents emergency department with a chief complaint of left knee pain and notes that he has felt more confused today.  Patient is a diabetic and notes that he has not been monitoring his blood sugars at home.  Patient notes that earlier today he had a mechanical fall while walking through his girlfriend's home striking his left knee on a piece of wood.  Patient notes that he has had no associated numbness or paresthesias distally.  He denies striking his head or injuring his neck or back during the fall.  He denies any abnormal headaches at this time.  Patient notes he has had no chest pain, shortness of breath, abdominal pain, nausea, vomiting, diarrhea.  He denies any numbness, paresthesias or unilateral weakness.   Fall       Home Medications Prior to Admission medications   Medication Sig Start Date End Date Taking? Authorizing Provider  acyclovir (ZOVIRAX) 400 MG tablet Take 1 tablet (400 mg total) by mouth 4 (four) times daily as needed. Please take if you start to have a cold sore 12/24/22   Gareth Eagle, PA-C  benzonatate (TESSALON) 100 MG capsule Take 1 capsule (100 mg total) by mouth every 8 (eight) hours. 05/20/22   Theron Arista, PA-C  cephALEXin (KEFLEX) 500 MG capsule Take 1 capsule (500 mg total) by mouth 4 (four) times daily. 11/12/22   Triplett, Tammy, PA-C  insulin aspart protamine- aspart (NOVOLOG MIX 70/30) (70-30) 100 UNIT/ML injection Inject 0.75 mLs (75 Units total) into the skin 2 (two) times daily with a meal. 12/20/17   Tat, Onalee Hua, MD  insulin aspart protamine- aspart (NOVOLOG MIX 70/30) (70-30) 100 UNIT/ML injection Inject 0.75 mLs (75 Units total) into the skin 2 (two) times daily with a meal. 04/15/23    Triplett, Tammy, PA-C  metFORMIN (GLUCOPHAGE) 500 MG tablet Take 1 tablet (500 mg total) by mouth daily with breakfast. 12/20/17   Tat, Onalee Hua, MD  metFORMIN (GLUCOPHAGE) 500 MG tablet Take 1 tablet (500 mg total) by mouth daily with breakfast. 04/15/23   Triplett, Tammy, PA-C  mupirocin nasal ointment (BACTROBAN) 2 % Apply in each nostril daily 07/30/21   Cheryll Cockayne, MD  silver sulfADIAZINE (SILVADENE) 1 % cream Apply 1 application topically daily. Apply to foot daily 12/19/17   Tat, Onalee Hua, MD      Allergies    Bee venom and Mushroom extract complex (obsolete)    Review of Systems   Review of Systems  Musculoskeletal:        Left knee pain  Psychiatric/Behavioral:  Positive for confusion.   All other systems reviewed and are negative.   Physical Exam Updated Vital Signs BP (!) 136/95   Pulse 75   Temp 98.1 F (36.7 C) (Oral)   Resp 20   Ht 6\' 1"  (1.854 m)   Wt 113.4 kg   SpO2 97%   BMI 32.98 kg/m  Physical Exam Vitals reviewed.  Constitutional:      Appearance: Normal appearance.  HENT:     Head: Normocephalic and atraumatic.     Nose: Nose normal.     Mouth/Throat:     Mouth: Mucous membranes are moist.  Eyes:  Extraocular Movements: Extraocular movements intact.     Conjunctiva/sclera: Conjunctivae normal.     Pupils: Pupils are equal, round, and reactive to light.  Cardiovascular:     Rate and Rhythm: Normal rate and regular rhythm.     Pulses: Normal pulses.     Heart sounds: Normal heart sounds. No murmur heard.    No gallop.  Pulmonary:     Effort: Pulmonary effort is normal. No respiratory distress.     Breath sounds: Normal breath sounds. No wheezing or rales.  Abdominal:     General: Abdomen is flat. Bowel sounds are normal. There is no distension.     Palpations: Abdomen is soft.     Tenderness: There is no abdominal tenderness. There is no guarding.  Musculoskeletal:        General: Normal range of motion.     Cervical back: Normal range of  motion and neck supple.     Comments: Tenderness palpation noted over the anterior aspect of the left knee, nontender palpation of remainder of bilateral extremities, full range of motion noted throughout, no ligamentous laxity, DP and PT pulses are 2+ distally, sensation intact distally, no obvious deformity or bruising, no skin breakdown ulceration, no lacerations, superficial laceration over the left knee  Skin:    General: Skin is warm and dry.  Neurological:     General: No focal deficit present.     Mental Status: He is alert and oriented to person, place, and time. Mental status is at baseline.     Cranial Nerves: No cranial nerve deficit.     Sensory: No sensory deficit.     Motor: No weakness.     Coordination: Coordination normal.     Gait: Gait normal.  Psychiatric:        Mood and Affect: Mood normal.        Behavior: Behavior normal.        Thought Content: Thought content normal.        Judgment: Judgment normal.     ED Results / Procedures / Treatments   Labs (all labs ordered are listed, but only abnormal results are displayed) Labs Reviewed  URINALYSIS, ROUTINE W REFLEX MICROSCOPIC - Abnormal; Notable for the following components:      Result Value   Color, Urine STRAW (*)    Glucose, UA >=500 (*)    Hgb urine dipstick MODERATE (*)    All other components within normal limits  CBC - Abnormal; Notable for the following components:   WBC 10.9 (*)    All other components within normal limits  BASIC METABOLIC PANEL - Abnormal; Notable for the following components:   Sodium 133 (*)    Chloride 97 (*)    Glucose, Bld 417 (*)    Calcium 8.8 (*)    All other components within normal limits  HEPATIC FUNCTION PANEL - Abnormal; Notable for the following components:   Albumin 3.3 (*)    AST 12 (*)    All other components within normal limits  CBG MONITORING, ED - Abnormal; Notable for the following components:   Glucose-Capillary 392 (*)    All other components  within normal limits  CBG MONITORING, ED - Abnormal; Notable for the following components:   Glucose-Capillary 376 (*)    All other components within normal limits  CBG MONITORING, ED - Abnormal; Notable for the following components:   Glucose-Capillary 288 (*)    All other components within normal limits  BETA-HYDROXYBUTYRIC ACID  CBG  MONITORING, ED  TROPONIN I (HIGH SENSITIVITY)    EKG None  Radiology CT Head Wo Contrast Result Date: 08/31/2023 CLINICAL DATA:  Delirium EXAM: CT HEAD WITHOUT CONTRAST TECHNIQUE: Contiguous axial images were obtained from the base of the skull through the vertex without intravenous contrast. RADIATION DOSE REDUCTION: This exam was performed according to the departmental dose-optimization program which includes automated exposure control, adjustment of the mA and/or kV according to patient size and/or use of iterative reconstruction technique. COMPARISON:  CT head 09/13/2022 FINDINGS: Brain: Patchy and confluent areas of decreased attenuation are noted throughout the deep and periventricular white matter of the cerebral hemispheres bilaterally, compatible with chronic microvascular ischemic disease. Left basal ganglia chronic lacunar infarctions. No evidence of large-territorial acute infarction. No parenchymal hemorrhage. No mass lesion. No extra-axial collection. No mass effect or midline shift. No hydrocephalus. Basilar cisterns are patent. Vascular: No hyperdense vessel. Skull: No acute fracture or focal lesion. Sinuses/Orbits: Paranasal sinuses and mastoid air cells are clear. The orbits are unremarkable. Other: None. IMPRESSION: No acute intracranial abnormality. Electronically Signed   By: Tish Frederickson M.D.   On: 08/31/2023 19:06   DG Chest Port 1 View Result Date: 08/31/2023 CLINICAL DATA:  Fall.  Pain. EXAM: PORTABLE CHEST - 1 VIEW COMPARISON:  07/20/2021 FINDINGS: Cardiomediastinal silhouette and pulmonary vasculature are within normal limits. No focal  airspace opacity to indicate pneumonia. 8 mm nodule noted in the right suprahilar lung. IMPRESSION: 1. No acute cardiopulmonary process. 2. 8 mm right suprahilar pulmonary nodule should be further evaluated with nonemergent contrast enhanced chest CT. Electronically Signed   By: Acquanetta Belling M.D.   On: 08/31/2023 18:54   DG Knee Complete 4 Views Left Result Date: 08/31/2023 CLINICAL DATA:  Trauma and pain EXAM: LEFT KNEE - COMPLETE 4+ VIEW COMPARISON:  None Available. FINDINGS: No acute fracture or dislocation. No joint effusion. Minimal degenerative irregularity of the patellofemoral articulation. Tiny enthesophyte at the quadriceps insertion. IMPRESSION: No acute osseous abnormality. Electronically Signed   By: Jeronimo Greaves M.D.   On: 08/31/2023 18:51    Procedures Procedures    Medications Ordered in ED Medications  sodium chloride 0.9 % bolus 1,000 mL (0 mLs Intravenous Stopped 08/31/23 1958)  insulin aspart (novoLOG) injection 10 Units (10 Units Subcutaneous Given 08/31/23 1859)  potassium chloride SA (KLOR-CON M) CR tablet 40 mEq (40 mEq Oral Given 08/31/23 1847)  lactated ringers bolus 1,000 mL (1,000 mLs Intravenous New Bag/Given 08/31/23 1958)    ED Course/ Medical Decision Making/ A&P                                 Medical Decision Making Amount and/or Complexity of Data Reviewed Labs: ordered. Radiology: ordered.  Risk Prescription drug management.   This patient presents to the ED for concern of left knee pain, confusion differential diagnosis includes CVA, TIA, hyperglycemia, DKA, HHS, ACS, electrolyte derangement, dehydration    Additional history obtained:  Additional history obtained from none External records from outside source obtained and reviewed including none   Lab Tests:  I Ordered, and personally interpreted labs.  The pertinent results include: Hyperglycemia   Imaging Studies ordered:  I ordered imaging studies including chest x-ray, left knee x-ray,  CT scan of the head I independently visualized and interpreted imaging which showed no acute intracranial hemorrhage, no acute osseous injury of the left knee, no acute cardiopulmonary process, 8 mm right coronary nodule I agree with  the radiologist interpretation   Medicines ordered and prescription drug management:  I ordered medication including IV fluids, insulin for hyperglycemia Reevaluation of the patient after these medicines showed that the patient improved I have reviewed the patients home medicines and have made adjustments as needed   Problem List / ED Course:  Patient is doing very well at this time and is stable for discharge home.  Patient notes that he feels completely back to his baseline at this time.  His significant other is at the bedside at this point and notes that he is mentally back at his baseline as well.  Patient had no concerning neurological deficits and do not suspect CVA or TIA.  Patient has no indication for underlying etiology such as DKA or HHS at this time.  He had no acute tenderness over the abdomen and do not suspect an acute intra-abdominal surgical pathology.  Patient does note that he is ready for discharge home at this time.  Stressed the importance of monitoring his blood sugar and taking his insulin as directed as he has not been doing so.  Information for follow-up with a primary care doctor was provided as well.  Patient voiced understanding to the plan and had no additional questions.  Patient was contacted by phone and made aware of the 8 mm pulmonary nodule and stressed the importance of follow-up for outpatient CT scan.  Again resources for primary care follow-up were provided to the patient.  Patient does voiced understanding to the nodule and notes that he will follow-up.   Social Determinants of Health:  None           Final Clinical Impression(s) / ED Diagnoses Final diagnoses:  None    Rx / DC Orders ED Discharge Orders      None         Kathlen Mody 08/31/23 2202    Gloris Manchester, MD 08/31/23 2356

## 2023-08-31 NOTE — ED Triage Notes (Signed)
 Pt tripped over some wood and fell today.  Denies hitting his head.  States he landed on left knee and with pain.  Pt with diabetes and states he has been having confusion today.

## 2024-03-12 ENCOUNTER — Emergency Department (HOSPITAL_COMMUNITY)
Admission: EM | Admit: 2024-03-12 | Discharge: 2024-03-12 | Disposition: A | Discharge status: Home / Self Care | Payer: Self-pay | Attending: Emergency Medicine | Admitting: Emergency Medicine

## 2024-03-12 ENCOUNTER — Encounter (HOSPITAL_COMMUNITY): Payer: Self-pay

## 2024-03-12 ENCOUNTER — Emergency Department (HOSPITAL_COMMUNITY): Payer: Self-pay

## 2024-03-12 ENCOUNTER — Emergency Department (EMERGENCY_DEPARTMENT_HOSPITAL): Payer: Self-pay

## 2024-03-12 DIAGNOSIS — Z8547 Personal history of malignant neoplasm of testis: Secondary | ICD-10-CM | POA: Insufficient documentation

## 2024-03-12 DIAGNOSIS — L03115 Cellulitis of right lower limb: Secondary | ICD-10-CM | POA: Insufficient documentation

## 2024-03-12 DIAGNOSIS — Z720 Tobacco use: Secondary | ICD-10-CM | POA: Insufficient documentation

## 2024-03-12 DIAGNOSIS — Z7984 Long term (current) use of oral hypoglycemic drugs: Secondary | ICD-10-CM | POA: Insufficient documentation

## 2024-03-12 DIAGNOSIS — Z794 Long term (current) use of insulin: Secondary | ICD-10-CM | POA: Insufficient documentation

## 2024-03-12 DIAGNOSIS — E119 Type 2 diabetes mellitus without complications: Secondary | ICD-10-CM | POA: Insufficient documentation

## 2024-03-12 DIAGNOSIS — M7989 Other specified soft tissue disorders: Secondary | ICD-10-CM

## 2024-03-12 LAB — CBC WITH DIFFERENTIAL/PLATELET
Abs Immature Granulocytes: 0.08 K/uL — ABNORMAL HIGH (ref 0.00–0.07)
Basophils Absolute: 0 K/uL (ref 0.0–0.1)
Basophils Relative: 0 %
Eosinophils Absolute: 0 K/uL (ref 0.0–0.5)
Eosinophils Relative: 0 %
HCT: 45.1 % (ref 39.0–52.0)
Hemoglobin: 14.4 g/dL (ref 13.0–17.0)
Immature Granulocytes: 1 %
Lymphocytes Relative: 7 %
Lymphs Abs: 1 K/uL (ref 0.7–4.0)
MCH: 26.6 pg (ref 26.0–34.0)
MCHC: 31.9 g/dL (ref 30.0–36.0)
MCV: 83.4 fL (ref 80.0–100.0)
Monocytes Absolute: 1.2 K/uL — ABNORMAL HIGH (ref 0.1–1.0)
Monocytes Relative: 8 %
Neutro Abs: 13.1 K/uL — ABNORMAL HIGH (ref 1.7–7.7)
Neutrophils Relative %: 84 %
Platelets: 232 K/uL (ref 150–400)
RBC: 5.41 MIL/uL (ref 4.22–5.81)
RDW: 14.6 % (ref 11.5–15.5)
WBC: 15.5 K/uL — ABNORMAL HIGH (ref 4.0–10.5)
nRBC: 0 % (ref 0.0–0.2)

## 2024-03-12 LAB — BASIC METABOLIC PANEL WITH GFR
Anion gap: 14 (ref 5–15)
BUN: 8 mg/dL (ref 6–20)
CO2: 23 mmol/L (ref 22–32)
Calcium: 8.9 mg/dL (ref 8.9–10.3)
Chloride: 96 mmol/L — ABNORMAL LOW (ref 98–111)
Creatinine, Ser: 0.66 mg/dL (ref 0.61–1.24)
GFR, Estimated: >60 mL/min (ref >60)
Glucose, Bld: 320 mg/dL — ABNORMAL HIGH (ref 70–99)
Potassium: 3.7 mmol/L (ref 3.5–5.1)
Sodium: 132 mmol/L — ABNORMAL LOW (ref 135–145)

## 2024-03-12 MED ORDER — DOXYCYCLINE HYCLATE 100 MG PO CAPS: 100.0000 mg | ORAL_CAPSULE | Freq: Two times a day (BID) | ORAL | 0 refills | Status: DC

## 2024-03-12 NOTE — ED Provider Notes (Signed)
 [place] EMERGENCY DEPARTMENT AT [facility] Provider Note   CSN: [id number] Arrival date & time: 03/12/24  9153     Patient presents with: Ankle Pain   Zachary Schneider is a 53 y.o. male.    Ankle Pain Patient presents with right ankle pain.  Started with some pain on the medial and posterior aspect of the calf yesterday.  Now more pain.  More swelling.  No fevers.  Erythema on ankle and swelling running up the leg.  No fevers or chills.  No trauma.  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    Past Medical History:  Diagnosis Date   Cancer [facility])    testicular   Diabetes mellitus without complication (HCC)    Obese    Tobacco use    Past Surgical History:  Procedure Laterality Date   INCISION AND DRAINAGE OF WOUND Right 12/18/2017   Procedure: DEBRIDEMENT OF INFECTED DIABETIC FOOT ULCER RIGHT FOOT;  Surgeon: Blinda Katz, DPM;  Location: [facility];  Service: Podiatry;  Laterality: Right;   KNEE SURGERY     TOE SURGERY Right      Prior to Admission medications   Medication Sig Start Date End Date Taking? Authorizing Provider  acyclovir  (ZOVIRAX ) 400 MG tablet Take 1 tablet (400 mg total) by mouth 4 (four) times daily as needed. Please take if you start to have a cold sore 12/24/22   Lang Norleen POUR, PA-C  benzonatate  (TESSALON ) 100 MG capsule Take 1 capsule (100 mg total) by mouth every 8 (eight) hours. 05/20/22   Emelia Sluder, PA-C  cephALEXin  (KEFLEX ) 500 MG capsule Take 1 capsule (500 mg total) by mouth 4 (four) times daily. 11/12/22   Triplett, Tammy, PA-C  doxycycline  (VIBRAMYCIN ) 100 MG capsule Take 1 capsule (100 mg total) by mouth 2 (two) times daily. 03/12/24   Patsey Lot, MD  insulin  aspart protamine- aspart (NOVOLOG  MIX 70/30) (70-30) 100 UNIT/ML injection Inject 0.75 mLs (75 Units total) into the skin 2 (two) times daily with a meal. 12/20/17   Tat, Alm, MD  insulin  aspart protamine- aspart (NOVOLOG  MIX 70/30) (70-30) 100  UNIT/ML injection Inject 0.75 mLs (75 Units total) into the skin 2 (two) times daily with a meal. 04/15/23   Triplett, Tammy, PA-C  metFORMIN  (GLUCOPHAGE ) 500 MG tablet Take 1 tablet (500 mg total) by mouth daily with breakfast. 12/20/17   Tat, Alm, MD  metFORMIN  (GLUCOPHAGE ) 500 MG tablet Take 1 tablet (500 mg total) by mouth daily with breakfast. 04/15/23   Triplett, Tammy, PA-C  mupirocin  nasal ointment (BACTROBAN ) 2 % Apply in each nostril daily 07/30/21   Phebe Fonda RAMAN, MD  silver  sulfADIAZINE  (SILVADENE ) 1 % cream Apply 1 application topically daily. Apply to foot daily 12/19/17   Tat, Alm, MD    Allergies: Bee venom and Mushroom extract complex (obsolete)    Review of Systems  Updated Vital Signs BP 135/79 (BP Location: Right Arm)   Pulse 78   Temp 98.2 F (36.8 C) (Oral)   Resp 20   SpO2 96%   Physical Exam Vitals reviewed.  HENT:     Head: Atraumatic.  Cardiovascular:     Rate and Rhythm: Regular rhythm.  Musculoskeletal:     Comments: Edema of right lower extremity greater than left.  Does have tender indurated area medially and somewhat anterior over the lower leg.  Does have erythema tracking up medially.  Does go up to the thigh.  Previous right great toe amputation  Skin:    Capillary Refill: Capillary refill takes less than 2 seconds.  Neurological:     Mental Status: He is alert and oriented to person, place, and time.        (all labs ordered are listed, but only abnormal results are displayed) Labs Reviewed  CBC WITH DIFFERENTIAL/PLATELET - Abnormal; Notable for the following components:      Result Value   WBC 15.5 (*)    Neutro Abs 13.1 (*)    Monocytes Absolute 1.2 (*)    Abs Immature Granulocytes 0.08 (*)    All other components within normal limits  BASIC METABOLIC PANEL WITH GFR - Abnormal; Notable for the following components:   Sodium 132 (*)    Chloride 96 (*)    Glucose, Bld 320 (*)    All other components within normal limits     EKG: None  Radiology: VAS US  LOWER EXTREMITY VENOUS (DVT) (ONLY [facility] & [facility]) Result Date: 03/12/2024  Lower Venous DVT Study Patient Name:  Zachary Schneider  Date of Exam:   03/12/2024 Medical Rec #: [id number]        Accession #:    [id number] Date of Birth: [birth year]        Patient Gender: M Patient Age:   56 years Exam Location:  [facility] Procedure:      VAS US  LOWER EXTREMITY VENOUS (DVT) Referring Phys: [place] --------------------------------------------------------------------------------  Indications: Pain, Swelling, and Edema.  Comparison Study: No prior exam. Performing Technologist: Edilia Elden Appl  Examination Guidelines: A complete evaluation includes B-mode imaging, spectral Doppler, color Doppler, and power Doppler as needed of all accessible portions of each vessel. Bilateral testing is considered an integral part of a complete examination. Limited examinations for reoccurring indications may be performed as noted. The reflux portion of the exam is performed with the patient in reverse Trendelenburg.  +---------+---------------+---------+-----------+----------+--------------+ RIGHT    CompressibilityPhasicitySpontaneityPropertiesThrombus Aging +---------+---------------+---------+-----------+----------+--------------+ CFV      Full           Yes      Yes                                 +---------+---------------+---------+-----------+----------+--------------+ SFJ      Full           Yes      Yes                                 +---------+---------------+---------+-----------+----------+--------------+ FV Prox  Full                                                        +---------+---------------+---------+-----------+----------+--------------+ FV Mid   Full                                                        +---------+---------------+---------+-----------+----------+--------------+ FV DistalFull                                                         +---------+---------------+---------+-----------+----------+--------------+  PFV      Full                                                        +---------+---------------+---------+-----------+----------+--------------+ POP      Full           Yes      Yes                                 +---------+---------------+---------+-----------+----------+--------------+ PTV      Full                                                        +---------+---------------+---------+-----------+----------+--------------+ PERO     Full                                                        +---------+---------------+---------+-----------+----------+--------------+   +----+---------------+---------+-----------+----------+--------------+ LEFTCompressibilityPhasicitySpontaneityPropertiesThrombus Aging +----+---------------+---------+-----------+----------+--------------+ CFV Full           Yes      Yes                                 +----+---------------+---------+-----------+----------+--------------+ SFJ Full           Yes      Yes                                 +----+---------------+---------+-----------+----------+--------------+     Summary: RIGHT: - There is no evidence of deep vein thrombosis in the lower extremity.  - No cystic structure found in the popliteal fossa.  LEFT: - No evidence of common femoral vein obstruction.   *See table(s) above for measurements and observations. Electronically signed by Debby Robertson on 03/12/2024 at 11:42:43 AM.    Final    DG Tibia/Fibula Right Result Date: 03/12/2024 CLINICAL DATA:  Right leg and calf pain EXAM: RIGHT TIBIA AND FIBULA - 2 VIEW COMPARISON:  None Available. FINDINGS: There is no evidence of fracture. Well corticated curvilinear ossification projects over the medial femoral condyle, which may reflect sequela of remote injury. Degenerative changes of the knee and ankle. Diffuse subcutaneous soft tissue  reticulation of the distal lower leg. Partially imaged prior great toe amputation. IMPRESSION: 1. No acute fracture or dislocation. 2. Diffuse subcutaneous soft tissue reticulation of the distal lower leg, which may reflect edema or cellulitis. Electronically Signed   By: Limin  Xu M.D.   On: 03/12/2024 09:47     Procedures   Medications Ordered in the ED - No data to display                                  Medical Decision Making Amount and/or Complexity of Data Reviewed Labs: ordered. Radiology: ordered.  Risk Prescription drug management.  Patient with pain and swelling of right lower leg.  Does have previous history of infections.  No systemic symptoms however.  Will get x-ray to evaluate for severe infection.  Will get basic blood work.  Will get Doppler to evaluate for causes such as DVT.  Doppler negative.  White count is somewhat elevated.  Afebrile however.  Patient does have tracking up his leg.  However no systemic symptoms.  Do not see any abscess or draining at this time.  Will start on antibiotics.  Will need follow-up in around 2 days.  Will return for ER if cannot get into see his PCP.       Final diagnoses:  Cellulitis of right lower extremity    ED Discharge Orders          Ordered    doxycycline  (VIBRAMYCIN ) 100 MG capsule  2 times daily,   Status:  Discontinued        03/12/24 1055    doxycycline  (VIBRAMYCIN ) 100 MG capsule  2 times daily        03/12/24 1107               Patsey Lot, MD 03/12/24 1639

## 2024-03-12 NOTE — Discharge Instructions (Addendum)
 Follow-up with your doctor in the next 2 days for recheck.  Return for worsening symptoms.  Continue your medicines.  You cannot be seen in the primary care office you should be seen in the ER.

## 2024-03-12 NOTE — ED Triage Notes (Addendum)
 Pt presents with c/o right ankle swelling and pain. Pt reports he started to have some pain in his right leg and calf yesterday and now the pain is localized to his right ankle. Ankle is swollen, warm to the touch, and red. Pt denies any injury.

## 2024-03-16 ENCOUNTER — Other Ambulatory Visit: Payer: Self-pay

## 2024-03-16 ENCOUNTER — Emergency Department (HOSPITAL_COMMUNITY): Payer: Self-pay

## 2024-03-16 ENCOUNTER — Inpatient Hospital Stay (HOSPITAL_COMMUNITY)
Admission: EM | Admit: 2024-03-16 | Discharge: 2024-03-23 | DRG: 574 | Disposition: A | Discharge status: Home / Self Care | Payer: Self-pay | Attending: Family Medicine | Admitting: Family Medicine

## 2024-03-16 ENCOUNTER — Encounter (HOSPITAL_COMMUNITY): Payer: Self-pay | Admitting: *Deleted

## 2024-03-16 DIAGNOSIS — Z91018 Allergy to other foods: Secondary | ICD-10-CM

## 2024-03-16 DIAGNOSIS — Z6834 Body mass index (BMI) 34.0-34.9, adult: Secondary | ICD-10-CM

## 2024-03-16 DIAGNOSIS — Z91148 Patient's other noncompliance with medication regimen for other reason: Secondary | ICD-10-CM

## 2024-03-16 DIAGNOSIS — E66811 Obesity, class 1: Secondary | ICD-10-CM | POA: Diagnosis present

## 2024-03-16 DIAGNOSIS — E871 Hypo-osmolality and hyponatremia: Secondary | ICD-10-CM | POA: Diagnosis present

## 2024-03-16 DIAGNOSIS — Z89411 Acquired absence of right great toe: Secondary | ICD-10-CM

## 2024-03-16 DIAGNOSIS — L03115 Cellulitis of right lower limb: Principal | ICD-10-CM | POA: Diagnosis present

## 2024-03-16 DIAGNOSIS — Z91030 Bee allergy status: Secondary | ICD-10-CM

## 2024-03-16 DIAGNOSIS — L02415 Cutaneous abscess of right lower limb: Principal | ICD-10-CM | POA: Diagnosis present

## 2024-03-16 DIAGNOSIS — E1165 Type 2 diabetes mellitus with hyperglycemia: Secondary | ICD-10-CM | POA: Diagnosis present

## 2024-03-16 DIAGNOSIS — L089 Local infection of the skin and subcutaneous tissue, unspecified: Secondary | ICD-10-CM

## 2024-03-16 DIAGNOSIS — Z803 Family history of malignant neoplasm of breast: Secondary | ICD-10-CM

## 2024-03-16 DIAGNOSIS — E876 Hypokalemia: Secondary | ICD-10-CM | POA: Diagnosis present

## 2024-03-16 DIAGNOSIS — B950 Streptococcus, group A, as the cause of diseases classified elsewhere: Secondary | ICD-10-CM

## 2024-03-16 DIAGNOSIS — Z833 Family history of diabetes mellitus: Secondary | ICD-10-CM

## 2024-03-16 DIAGNOSIS — Z820 Family history of epilepsy and other diseases of the nervous system: Secondary | ICD-10-CM

## 2024-03-16 DIAGNOSIS — Z8547 Personal history of malignant neoplasm of testis: Secondary | ICD-10-CM

## 2024-03-16 DIAGNOSIS — L02416 Cutaneous abscess of left lower limb: Secondary | ICD-10-CM

## 2024-03-16 DIAGNOSIS — F17210 Nicotine dependence, cigarettes, uncomplicated: Secondary | ICD-10-CM | POA: Diagnosis present

## 2024-03-16 DIAGNOSIS — D62 Acute posthemorrhagic anemia: Secondary | ICD-10-CM | POA: Diagnosis not present

## 2024-03-16 LAB — COMPREHENSIVE METABOLIC PANEL WITH GFR
ALT: 12 U/L (ref 0–44)
AST: 13 U/L — ABNORMAL LOW (ref 15–41)
Albumin: 3.3 g/dL — ABNORMAL LOW (ref 3.5–5.0)
Alkaline Phosphatase: 91 U/L (ref 38–126)
Anion gap: 12 (ref 5–15)
BUN: 9 mg/dL (ref 6–20)
CO2: 25 mmol/L (ref 22–32)
Calcium: 9.2 mg/dL (ref 8.9–10.3)
Chloride: 96 mmol/L — ABNORMAL LOW (ref 98–111)
Creatinine, Ser: 0.61 mg/dL (ref 0.61–1.24)
GFR, Estimated: >60 mL/min (ref >60)
Glucose, Bld: 407 mg/dL — ABNORMAL HIGH (ref 70–99)
Potassium: 3.9 mmol/L (ref 3.5–5.1)
Sodium: 134 mmol/L — ABNORMAL LOW (ref 135–145)
Total Bilirubin: 0.6 mg/dL (ref 0.0–1.2)
Total Protein: 7 g/dL (ref 6.5–8.1)

## 2024-03-16 LAB — CBC WITH DIFFERENTIAL/PLATELET
Abs Immature Granulocytes: 0.52 K/uL — ABNORMAL HIGH (ref 0.00–0.07)
Basophils Absolute: 0.1 K/uL (ref 0.0–0.1)
Basophils Relative: 1 %
Eosinophils Absolute: 0.1 K/uL (ref 0.0–0.5)
Eosinophils Relative: 0 %
HCT: 42.7 % (ref 39.0–52.0)
Hemoglobin: 13.5 g/dL (ref 13.0–17.0)
Immature Granulocytes: 4 %
Lymphocytes Relative: 18 %
Lymphs Abs: 2.5 K/uL (ref 0.7–4.0)
MCH: 26.4 pg (ref 26.0–34.0)
MCHC: 31.6 g/dL (ref 30.0–36.0)
MCV: 83.6 fL (ref 80.0–100.0)
Monocytes Absolute: 1 K/uL (ref 0.1–1.0)
Monocytes Relative: 7 %
Neutro Abs: 9.9 K/uL — ABNORMAL HIGH (ref 1.7–7.7)
Neutrophils Relative %: 70 %
Platelets: 309 K/uL (ref 150–400)
RBC: 5.11 MIL/uL (ref 4.22–5.81)
RDW: 14.3 % (ref 11.5–15.5)
WBC: 13.9 K/uL — ABNORMAL HIGH (ref 4.0–10.5)
nRBC: 0 % (ref 0.0–0.2)

## 2024-03-16 LAB — GLUCOSE, CAPILLARY: Glucose-Capillary: 408 mg/dL — ABNORMAL HIGH (ref 70–99)

## 2024-03-16 LAB — I-STAT CG4 LACTIC ACID, ED
Lactic Acid, Venous: 2.4 mmol/L (ref 0.5–1.9)
Lactic Acid, Venous: 3.4 mmol/L (ref 0.5–1.9)
Lactic Acid, Venous: 3.1 mmol/L (ref 0.5–1.9)

## 2024-03-16 LAB — PROTIME-INR
INR: 1.2 (ref 0.8–1.2)
Prothrombin Time: 15.8 s — ABNORMAL HIGH (ref 11.4–15.2)

## 2024-03-16 LAB — LACTIC ACID, PLASMA: Lactic Acid, Venous: 2.9 mmol/L (ref 0.5–1.9)

## 2024-03-16 LAB — CBG MONITORING, ED: Glucose-Capillary: 345 mg/dL — ABNORMAL HIGH (ref 70–99)

## 2024-03-16 MED ORDER — SODIUM CHLORIDE 0.9 % IV BOLUS
1000.0000 mL | Freq: Once | INTRAVENOUS | Status: AC
  Administered 2024-03-16: 1000 mL via INTRAVENOUS

## 2024-03-16 MED ORDER — PROCHLORPERAZINE EDISYLATE 10 MG/2ML IJ SOLN: 5.0000 mg | Freq: Four times a day (QID) | INTRAMUSCULAR | Status: DC | PRN

## 2024-03-16 MED ORDER — SODIUM CHLORIDE 0.9 % IV BOLUS
2000.0000 mL | Freq: Once | INTRAVENOUS | Status: AC
  Administered 2024-03-16: 2000 mL via INTRAVENOUS

## 2024-03-16 MED ORDER — VANCOMYCIN HCL IN DEXTROSE 1-5 GM/200ML-% IV SOLN: 1000.0000 mg | Freq: Once | INTRAVENOUS | Status: DC

## 2024-03-16 MED ORDER — SODIUM CHLORIDE 0.9 % IV SOLN
2.0000 g | Freq: Once | INTRAVENOUS | Status: AC
  Administered 2024-03-16: 2 g via INTRAVENOUS
  Filled 2024-03-16: qty 12.5

## 2024-03-16 MED ORDER — ENOXAPARIN SODIUM 60 MG/0.6ML IJ SOSY
60.0000 mg | PREFILLED_SYRINGE | INTRAMUSCULAR | Status: DC
  Administered 2024-03-16: 60 mg via SUBCUTANEOUS
  Filled 2024-03-16 (×2): qty 0.6

## 2024-03-16 MED ORDER — SODIUM CHLORIDE 0.9% FLUSH
3.0000 mL | Freq: Two times a day (BID) | INTRAVENOUS | Status: DC
  Administered 2024-03-16 – 2024-03-22 (×10): 3 mL via INTRAVENOUS

## 2024-03-16 MED ORDER — POLYETHYLENE GLYCOL 3350 17 G PO PACK: 17.0000 g | PACK | Freq: Every day | ORAL | Status: DC | PRN

## 2024-03-16 MED ORDER — HYDROMORPHONE HCL 1 MG/ML IJ SOLN
0.5000 mg | INTRAMUSCULAR | Status: DC | PRN
  Administered 2024-03-16 – 2024-03-18 (×5): 0.5 mg via INTRAVENOUS
  Filled 2024-03-16 (×3): qty 0.5
  Filled 2024-03-16: qty 1
  Filled 2024-03-16: qty 0.5

## 2024-03-16 MED ORDER — VANCOMYCIN HCL 2000 MG/400ML IV SOLN
2000.0000 mg | Freq: Once | INTRAVENOUS | Status: AC
  Administered 2024-03-16: 2000 mg via INTRAVENOUS
  Filled 2024-03-16: qty 400

## 2024-03-16 MED ORDER — INSULIN ASPART 100 UNIT/ML IJ SOLN
0.0000 [IU] | Freq: Three times a day (TID) | INTRAMUSCULAR | Status: DC
  Administered 2024-03-16 – 2024-03-17 (×2): 4 [IU] via SUBCUTANEOUS
  Administered 2024-03-17 (×2): 3 [IU] via SUBCUTANEOUS
  Administered 2024-03-18 – 2024-03-19 (×2): 2 [IU] via SUBCUTANEOUS
  Administered 2024-03-19 (×2): 3 [IU] via SUBCUTANEOUS
  Administered 2024-03-20 – 2024-03-23 (×8): 1 [IU] via SUBCUTANEOUS
  Filled 2024-03-16: qty 0.06

## 2024-03-16 MED ORDER — VANCOMYCIN HCL 1500 MG/300ML IV SOLN
1500.0000 mg | Freq: Two times a day (BID) | INTRAVENOUS | Status: DC
  Administered 2024-03-17 – 2024-03-21 (×9): 1500 mg via INTRAVENOUS
  Filled 2024-03-16 (×9): qty 300

## 2024-03-16 MED ORDER — SODIUM CHLORIDE 0.9 % IV SOLN
2.0000 g | Freq: Three times a day (TID) | INTRAVENOUS | Status: DC
  Administered 2024-03-17 – 2024-03-21 (×13): 2 g via INTRAVENOUS
  Filled 2024-03-16 (×13): qty 12.5

## 2024-03-16 MED ORDER — ACETAMINOPHEN 325 MG PO TABS
650.0000 mg | ORAL_TABLET | Freq: Four times a day (QID) | ORAL | Status: DC | PRN
  Administered 2024-03-19: 650 mg via ORAL
  Filled 2024-03-16: qty 2

## 2024-03-16 MED ORDER — INSULIN ASPART 100 UNIT/ML IJ SOLN
6.0000 [IU] | Freq: Once | INTRAMUSCULAR | Status: AC
  Administered 2024-03-16: 6 [IU] via SUBCUTANEOUS

## 2024-03-16 MED ORDER — LACTATED RINGERS IV BOLUS
500.0000 mL | Freq: Once | INTRAVENOUS | Status: AC
  Administered 2024-03-17: 500 mL via INTRAVENOUS

## 2024-03-16 MED ORDER — ACETAMINOPHEN 650 MG RE SUPP: 650.0000 mg | Freq: Four times a day (QID) | RECTAL | Status: DC | PRN

## 2024-03-16 MED ORDER — OXYCODONE HCL 5 MG PO TABS
5.0000 mg | ORAL_TABLET | ORAL | Status: DC | PRN
  Administered 2024-03-16 – 2024-03-20 (×8): 5 mg via ORAL
  Filled 2024-03-16 (×8): qty 1

## 2024-03-16 MED ORDER — SODIUM CHLORIDE 0.9 % IV SOLN: INTRAVENOUS | Status: DC

## 2024-03-16 MED ORDER — INSULIN ASPART 100 UNIT/ML IJ SOLN
0.0000 [IU] | Freq: Every day | INTRAMUSCULAR | Status: DC
  Administered 2024-03-17: 3 [IU] via SUBCUTANEOUS
  Administered 2024-03-18 – 2024-03-21 (×2): 2 [IU] via SUBCUTANEOUS
  Filled 2024-03-16: qty 0.05

## 2024-03-16 MED ORDER — INFLUENZA VIRUS VACC SPLIT PF (FLUZONE) 0.5 ML IM SUSY: 0.5000 mL | PREFILLED_SYRINGE | INTRAMUSCULAR | Status: DC

## 2024-03-16 NOTE — ED Provider Notes (Signed)
 [place] EMERGENCY DEPARTMENT AT [facility] Provider Note   CSN: [id number] Arrival date & time: 03/16/24  1513     Patient presents with: Cellulitis   Zachary Schneider is a 53 y.o. male with past medical history of sepsis, uncontrolled diabetes, diabetic foot ulcer is presenting to emergency room with the complaint of cellulitis.  Patient reports he has had approximately 5 days of right leg pain and swelling with redness and rash.  Has been on 3 days of doxycycline  already for this.  Continues to have worsening cellulitis and has now had some pustules pop up to anterior shin.  Denies fever or chills at home. No severe pain with passive ankle movement.    HPI     Prior to Admission medications   Medication Sig Start Date End Date Taking? Authorizing Provider  doxycycline  (VIBRAMYCIN ) 100 MG capsule Take 1 capsule (100 mg total) by mouth 2 (two) times daily. 03/12/24  Yes Patsey Lot, MD  Ibuprofen  200 MG CAPS Take 600 mg by mouth every 6 (six) hours as needed (for pain).   Yes [provider]  acyclovir  (ZOVIRAX ) 400 MG tablet Take 1 tablet (400 mg total) by mouth 4 (four) times daily as needed. Please take if you start to have a cold sore Patient not taking: Reported on 03/16/2024 12/24/22   Robinson, John K, PA-C  benzonatate  (TESSALON ) 100 MG capsule Take 1 capsule (100 mg total) by mouth every 8 (eight) hours. Patient not taking: Reported on 03/16/2024 05/20/22   Emelia Sluder, PA-C  cephALEXin  (KEFLEX ) 500 MG capsule Take 1 capsule (500 mg total) by mouth 4 (four) times daily. Patient not taking: Reported on 03/16/2024 11/12/22   Herlinda Milling, PA-C  insulin  aspart protamine- aspart (NOVOLOG  MIX 70/30) (70-30) 100 UNIT/ML injection Inject 0.75 mLs (75 Units total) into the skin 2 (two) times daily with a meal. Patient not taking: Reported on 03/16/2024 12/20/17   Evonnie Lenis, MD  insulin  aspart protamine- aspart (NOVOLOG  MIX 70/30) (70-30) 100 UNIT/ML injection  Inject 0.75 mLs (75 Units total) into the skin 2 (two) times daily with a meal. Patient not taking: Reported on 03/16/2024 04/15/23   Herlinda Milling, PA-C  metFORMIN  (GLUCOPHAGE ) 500 MG tablet Take 1 tablet (500 mg total) by mouth daily with breakfast. Patient not taking: Reported on 03/16/2024 12/20/17   Evonnie Lenis, MD  metFORMIN  (GLUCOPHAGE ) 500 MG tablet Take 1 tablet (500 mg total) by mouth daily with breakfast. Patient not taking: Reported on 03/16/2024 04/15/23   Herlinda Milling, PA-C  mupirocin  nasal ointment (BACTROBAN ) 2 % Apply in each nostril daily Patient not taking: Reported on 03/16/2024 07/30/21   Phebe Fonda RAMAN, MD  silver  sulfADIAZINE  (SILVADENE ) 1 % cream Apply 1 application topically daily. Apply to foot daily Patient not taking: Reported on 03/16/2024 12/19/17   Evonnie Lenis, MD    Allergies: Bee venom, [place] root (wolfiporia cocos), Mushroom, and Mushroom extract complex (obsolete)    Review of Systems  Updated Vital Signs BP (!) 154/94   Pulse 85   Temp 98.2 F (36.8 C) (Oral)   Resp 18   Ht 6' 1 (1.854 m)   Wt 113.4 kg   SpO2 93%   BMI 32.98 kg/m   Physical Exam Vitals and nursing note reviewed.  Constitutional:      General: He is not in acute distress.    Appearance: He is not toxic-appearing.  HENT:     Head: Normocephalic and atraumatic.  Eyes:  General: No scleral icterus.    Conjunctiva/sclera: Conjunctivae normal.  Cardiovascular:     Rate and Rhythm: Normal rate and regular rhythm.     Pulses: Normal pulses.     Heart sounds: Normal heart sounds.  Pulmonary:     Effort: Pulmonary effort is normal. No respiratory distress.     Breath sounds: Normal breath sounds.  Abdominal:     General: Abdomen is flat. Bowel sounds are normal.     Palpations: Abdomen is soft.     Tenderness: There is no abdominal tenderness.  Musculoskeletal:     Right lower leg: Edema present.     Left lower leg: No edema.  Skin:    General: Skin is warm and dry.      Findings: Rash present. No lesion.  Neurological:     General: No focal deficit present.     Mental Status: He is alert and oriented to person, place, and time. Mental status is at baseline.          (all labs ordered are listed, but only abnormal results are displayed) Labs Reviewed  CBC WITH DIFFERENTIAL/PLATELET - Abnormal; Notable for the following components:      Result Value   WBC 13.9 (*)    Neutro Abs 9.9 (*)    Abs Immature Granulocytes 0.52 (*)    All other components within normal limits  I-STAT CG4 LACTIC ACID, ED - Abnormal; Notable for the following components:   Lactic Acid, Venous 2.4 (*)    All other components within normal limits  CULTURE, BLOOD (ROUTINE X 2)  CULTURE, BLOOD (ROUTINE X 2)  COMPREHENSIVE METABOLIC PANEL WITH GFR  PROTIME-INR  I-STAT CG4 LACTIC ACID, ED    EKG: None  Radiology: DG Tibia/Fibula Right Result Date: 03/16/2024 CLINICAL DATA:  right toe lac/cellulitis EXAM: RIGHT TIBIA AND FIBULA - 2 VIEW COMPARISON:  March 12, 2024 FINDINGS: No acute fracture or dislocation. Redemonstrated sequelae of remote trauma along the medial femoral condyle. There is no evidence of arthropathy or other focal bone abnormality. Moderate subcutaneous edema about the calf. No radiopaque gas or radiopaque foreign body. IMPRESSION: Moderate subcutaneous edema about the calf. No subcutaneous gas or radiographic findings of osteomyelitis, at this time. Electronically Signed   By: Rogelia Myers M.D.   On: 03/16/2024 16:49   DG Foot 2 Views Right Result Date: 03/16/2024 CLINICAL DATA:  right toe lac/cellulitis EXAM: RIGHT FOOT - 2 VIEW COMPARISON:  12/17/2017 FINDINGS: Interval amputation of the great toe.No acute fracture or dislocation. Degenerative spurring along the midfoot. Moderate soft tissue swelling. No subcutaneous gas. IMPRESSION: Moderate soft tissue swelling about the foot. No subcutaneous gas or radiographic findings of osteomyelitis, at this time.  Electronically Signed   By: Rogelia Myers M.D.   On: 03/16/2024 16:47     Procedures   Medications Ordered in the ED  ceFEPIme  (MAXIPIME ) 2 g in sodium chloride  0.9 % 100 mL IVPB (2 g Intravenous New Bag/Given 03/16/24 1653)  vancomycin  (VANCOREADY) IVPB 2000 mg/400 mL (has no administration in time range)    Clinical Course as of 03/16/24 2111  Mon Mar 16, 2024  1919 Glucose(!): 407 Elevated, but no anion gap.  [JB]  1920 WBC(!): 13.9 [JB]  1959 Lactic Acid, Venous(!!): 3.1 Repeat was obtained after only 500cc bolus received. Will need to recheck after full bolus (3L ordered, 2.5L to go). [JB]    Clinical Course User Index [JB] Alexcia Schools, Warren SAILOR, PA-C  Medical Decision Making Amount and/or Complexity of Data Reviewed Labs: ordered. Decision-making details documented in ED Course. Radiology: ordered.  Risk Prescription drug management. Decision regarding hospitalization.   This patient presents to the ED for concern of cellulitis, this involves an extensive number of treatment options, and is a complaint that carries with it a high risk of complications and morbidity.  The differential diagnosis includes cellulitis, deep space infection, abscess,dvt   Co morbidities that complicate the patient evaluation  Uncontrolled DM   Additional history obtained:  Additional history obtained from 03/12/24 Ed visit, negative DVT study OP.   Lab Tests:  I personally interpreted labs.  The pertinent results include:   Leukocytosis 13.9 Lactic 2.4 rechecked after 500cc at 3.1, will recheck after full 3L. Ordered.    Imaging Studies ordered:  I ordered imaging studies including x-ray of right foot and tib-fib I independently visualized and interpreted imaging which showed soft tissue swelling.  No osteomyelitis.  No gas. I agree with the radiologist interpretation   Cardiac Monitoring: / EKG:  The patient was maintained on a cardiac monitor.      Consultations Obtained:  I requested consultation with the hospitalist,  and discussed lab and imaging findings as well as pertinent plan - they recommend: Admission   Problem List / ED Course / Critical interventions / Medication management  Patient is presenting with complaint of cellulitis.  Has failed outpatient treatment of doxycycline .  On arrival he has stable vital signs, afebrile.  He is neurovascularly intact.  Does have significant cellulitis with pustules but no large area of fluctuance or fluid collection on exam/POCUS. X-ray show soft tissue swelling.  Has leukocytosis of 13.9 and elevated lactic. I ordered medication including 3 L saline bolus, cefepime , Vanco. Reevaluation of the patient after these medicines showed that the patient stayed the same I have reviewed the patients home medicines and have made adjustments as needed Requires admission for sepsis and failed outpatient treatment of cellulitis        Final diagnoses:  Cellulitis of right lower extremity    ED Discharge Orders     None          Shermon Warren LOISE RIGGERS 03/16/24 2115    Randol Simmonds, MD 03/17/24 [phone]

## 2024-03-16 NOTE — ED Notes (Signed)
 Incorrect sample for second lactic acid result.  Repeat lab is a due at 1850. Provider notified of false reading.

## 2024-03-16 NOTE — Progress Notes (Signed)
 Date and time results received: 03/16/24 2301 (use smartphrase .now to insert current time)  Test: Lactic Acid  Critical Value: 2.9  Name of Provider Notified: Lynwood MARLA Kipper, NP  Orders Received? Or Actions Taken?: Actions Taken: Provider on Call notified. Order placed for 500 ml bolus of Lactated ringers  over 2 hours.  Bolus administered.

## 2024-03-16 NOTE — H&P (Signed)
 History and Physical    NESTER BACHUS FMW:[id number] DOB: May 21, 1971 DOA: 03/16/2024  PCP: Pcp, No   Patient coming from: Home   Chief Complaint: Right lower leg redness, swelling, pain   HPI: BEE HAMMERSCHMIDT is a 53 y.o. male with medical history significant for testicular cancer and poorly controlled type 2 diabetes mellitus who presents with worsening redness, swelling, and pain involving the lower right leg.  Patient was seen on 03/12/2024 with 1 to 2 days of right calf redness, swelling, and pain.  He was diagnosed with cellulitis and started on doxycycline  at that time but now returns with worsening in the symptoms.  He denies fever or chills and, aside from the right lower leg, has not noticed any other areas of concern.   ED Course: Upon arrival to the ED, patient is found to be afebrile and saturating mid 90s on room air with normal HR and stable BP.  Labs are most notable for glucose 407, normal creatinine, WBC 13,900, and lactic acid 2.4.  There is no gas or acute osseous abnormality noted on plain radiographs of the right foot and right lower leg.  Blood culture was collected and the patient was given 2 L of NS, vancomycin , and cefepime .  Review of Systems:  All other systems reviewed and apart from HPI, are negative.  Past Medical History:  Diagnosis Date   Cancer [facility])    testicular   Diabetes mellitus without complication (HCC)    Obese    Tobacco use     Past Surgical History:  Procedure Laterality Date   INCISION AND DRAINAGE OF WOUND Right 12/18/2017   Procedure: DEBRIDEMENT OF INFECTED DIABETIC FOOT ULCER RIGHT FOOT;  Surgeon: Blinda Katz, DPM;  Location: [facility];  Service: Podiatry;  Laterality: Right;   KNEE SURGERY     TOE SURGERY Right     Social History:   reports that he has been smoking cigarettes. He has never used smokeless tobacco. He reports that he does not drink alcohol and does not use drugs.  Allergies  Allergen Reactions   Bee  Venom Anaphylaxis and Swelling   [place] Root (Wolfiporia Cocos) Diarrhea and Nausea And Vomiting   Mushroom Diarrhea   Mushroom Extract Complex (Obsolete) Nausea And Vomiting    Family History  Problem Relation Age of Onset   Breast cancer Mother    Multiple sclerosis Father    Diabetes Mellitus II Paternal Grandmother    Lupus Paternal Grandmother      Prior to Admission medications   Medication Sig Start Date End Date Taking? Authorizing Provider  doxycycline  (VIBRAMYCIN ) 100 MG capsule Take 1 capsule (100 mg total) by mouth 2 (two) times daily. 03/12/24  Yes Patsey Lot, MD  Ibuprofen  200 MG CAPS Take 600 mg by mouth every 6 (six) hours as needed (for pain).   Yes [provider]  acyclovir  (ZOVIRAX ) 400 MG tablet Take 1 tablet (400 mg total) by mouth 4 (four) times daily as needed. Please take if you start to have a cold sore Patient not taking: Reported on 03/16/2024 12/24/22   Lang Norleen POUR, PA-C  insulin  aspart protamine- aspart (NOVOLOG  MIX 70/30) (70-30) 100 UNIT/ML injection Inject 0.75 mLs (75 Units total) into the skin 2 (two) times daily with a meal. Patient not taking: Reported on 03/16/2024 12/20/17   Evonnie Lenis, MD  insulin  aspart protamine- aspart (NOVOLOG  MIX 70/30) (70-30) 100 UNIT/ML injection Inject 0.75 mLs (75 Units total) into the skin 2 (two) times daily  with a meal. Patient not taking: Reported on 03/16/2024 04/15/23   Herlinda Milling, PA-C  metFORMIN  (GLUCOPHAGE ) 500 MG tablet Take 1 tablet (500 mg total) by mouth daily with breakfast. Patient not taking: Reported on 03/16/2024 12/20/17   Evonnie Lenis, MD  metFORMIN  (GLUCOPHAGE ) 500 MG tablet Take 1 tablet (500 mg total) by mouth daily with breakfast. Patient not taking: Reported on 03/16/2024 04/15/23   Triplett, Tammy, PA-C  silver  sulfADIAZINE  (SILVADENE ) 1 % cream Apply 1 application topically daily. Apply to foot daily Patient not taking: Reported on 03/16/2024 12/19/17   Evonnie Lenis, MD    Physical  Exam: Vitals:   03/16/24 1517 03/16/24 1524 03/16/24 1822  BP: (!) 154/94  (!) 159/84  Pulse: 85  75  Resp: 18  18  Temp: 98.2 F (36.8 C)    TempSrc: Oral    SpO2: 93%  97%  Weight:  113.4 kg   Height:  6' 1 (1.854 m)     Constitutional: NAD, no pallor or diaphoresis   Eyes: PERTLA, lids and conjunctivae normal ENMT: Mucous membranes are moist. Posterior pharynx clear of any exudate or lesions.   Neck: supple, no masses  Respiratory: no wheezing, no crackles. No accessory muscle use.  Cardiovascular: S1 & S2 heard, regular rate and rhythm. No JVD. Abdomen: No tenderness, soft. Bowel sounds active.  Musculoskeletal: no clubbing / cyanosis. S/p b/l great toe amputations.   Skin: Right lower leg erythema, heat, edema, and tenderness with pustules. Skin is otherwise warm, dry, well-perfused. Neurologic: CN 2-12 grossly intact. Moving all extremities. Alert and oriented.  Psychiatric: Calm. Cooperative.    Labs and Imaging on Admission: I have personally reviewed following labs and imaging studies  CBC: Recent Labs  Lab 03/12/24 0907 03/16/24 1657  WBC 15.5* 13.9*  NEUTROABS 13.1* 9.9*  HGB 14.4 13.5  HCT 45.1 42.7  MCV 83.4 83.6  PLT 232 309   Basic Metabolic Panel: Recent Labs  Lab 03/12/24 1000 03/16/24 1657  NA 132* 134*  K 3.7 3.9  CL 96* 96*  CO2 23 25  GLUCOSE 320* 407*  BUN 8 9  CREATININE 0.66 0.61  CALCIUM  8.9 9.2   GFR: Estimated Creatinine Clearance: 140.9 mL/min (by C-G formula based on SCr of 0.61 mg/dL). Liver Function Tests: Recent Labs  Lab 03/16/24 1657  AST 13*  ALT 12  ALKPHOS 91  BILITOT 0.6  PROT 7.0  ALBUMIN 3.3*   No results for input(s): LIPASE, AMYLASE in the last 168 hours. No results for input(s): AMMONIA in the last 168 hours. Coagulation Profile: Recent Labs  Lab 03/16/24 1657  INR 1.2   Cardiac Enzymes: No results for input(s): CKTOTAL, CKMB, CKMBINDEX, TROPONINI in the last 168 hours. BNP (last 3  results) No results for input(s): PROBNP in the last 8760 hours. HbA1C: No results for input(s): HGBA1C in the last 72 hours. CBG: No results for input(s): GLUCAP in the last 168 hours. Lipid Profile: No results for input(s): CHOL, HDL, LDLCALC, TRIG, CHOLHDL, LDLDIRECT in the last 72 hours. Thyroid Function Tests: No results for input(s): TSH, T4TOTAL, FREET4, T3FREE, THYROIDAB in the last 72 hours. Anemia Panel: No results for input(s): VITAMINB12, FOLATE, FERRITIN, TIBC, IRON, RETICCTPCT in the last 72 hours. Urine analysis:    Component Value Date/Time   COLORURINE STRAW (A) 08/31/2023 1732   APPEARANCEUR CLEAR 08/31/2023 1732   LABSPEC 1.029 08/31/2023 1732   PHURINE 6.0 08/31/2023 1732   GLUCOSEU >=500 (A) 08/31/2023 1732   HGBUR MODERATE (A) 08/31/2023  1732   BILIRUBINUR NEGATIVE 08/31/2023 1732   KETONESUR NEGATIVE 08/31/2023 1732   PROTEINUR NEGATIVE 08/31/2023 1732   NITRITE NEGATIVE 08/31/2023 1732   LEUKOCYTESUR NEGATIVE 08/31/2023 1732   Sepsis Labs: @LABRCNTIP (procalcitonin:4,lacticidven:4) )No results found for this or any previous visit (from the past 240 hours).   Radiological Exams on Admission: DG Tibia/Fibula Right Result Date: 03/16/2024 CLINICAL DATA:  right toe lac/cellulitis EXAM: RIGHT TIBIA AND FIBULA - 2 VIEW COMPARISON:  March 12, 2024 FINDINGS: No acute fracture or dislocation. Redemonstrated sequelae of remote trauma along the medial femoral condyle. There is no evidence of arthropathy or other focal bone abnormality. Moderate subcutaneous edema about the calf. No radiopaque gas or radiopaque foreign body. IMPRESSION: Moderate subcutaneous edema about the calf. No subcutaneous gas or radiographic findings of osteomyelitis, at this time. Electronically Signed   By: Rogelia Myers M.D.   On: 03/16/2024 16:49   DG Foot 2 Views Right Result Date: 03/16/2024 CLINICAL DATA:  right toe lac/cellulitis EXAM: RIGHT  FOOT - 2 VIEW COMPARISON:  12/17/2017 FINDINGS: Interval amputation of the great toe.No acute fracture or dislocation. Degenerative spurring along the midfoot. Moderate soft tissue swelling. No subcutaneous gas. IMPRESSION: Moderate soft tissue swelling about the foot. No subcutaneous gas or radiographic findings of osteomyelitis, at this time. Electronically Signed   By: Rogelia Myers M.D.   On: 03/16/2024 16:47     Assessment/Plan   1. RLE cellulitis  - Continue empiric antibiotics, follow culture and clinical course, obtain advanced imaging if worsens or fails to improve as expected    2. Type II DM  - Serum glucose is 407 in ED without DKA  - Check CBGs and use SSI for now    DVT prophylaxis: Lovenox   Code Status: Full  Level of Care: Level of care: Med-Surg Family Communication: Wife at bedside  Disposition Plan:  Patient is from: Home  Anticipated d/c is to: Home  Anticipated d/c date is: 03/19/24  Patient currently: Pending treatment of RLE cellulitis  Consults called: None  Admission status: Inpatient     Evalene GORMAN Sprinkles, MD [facility]  03/16/2024, 7:12 PM

## 2024-03-16 NOTE — ED Notes (Signed)
 Patient refused EKG stating, I'm going to say no to the EKG right now. Spouse stated they are only wanting to do what is medically necessary regarding his leg/foot.

## 2024-03-16 NOTE — Progress Notes (Signed)
 Pharmacy Antibiotic Note  OSIEL STICK is a 53 y.o. male admitted on 03/16/2024 with RLE cellulitis.  Pharmacy has been consulted for cefepime  & vancomycin  dosing.  Plan: Cefepime  2 gm IV q8hrs Vancomycin  2 gm IV x 1 in ED then Vancomycin  1500 mg IV q12hrs for eAUC 506. SCr rounded up to 0.8, Vd 0.5    Height: 6' 1 (185.4 cm) Weight: 113.4 kg (250 lb) IBW/kg (Calculated) : 79.9  Temp (24hrs), Avg:98.5 F (36.9 C), Min:98.2 F (36.8 C), Max:98.8 F (37.1 C)  Recent Labs  Lab 03/12/24 0907 03/12/24 1000 03/16/24 1654 03/16/24 1657 03/16/24 1747 03/16/24 1908  WBC 15.5*  --   --  13.9*  --   --   CREATININE  --  0.66  --  0.61  --   --   LATICACIDVEN  --   --  2.4*  --  3.4* 3.1*    Estimated Creatinine Clearance: 140.9 mL/min (by C-G formula based on SCr of 0.61 mg/dL).    Allergies  Allergen Reactions   Bee Venom Anaphylaxis and Swelling   [place] Root (Wolfiporia Cocos) Diarrhea and Nausea And Vomiting   Mushroom Diarrhea   Mushroom Extract Complex (Obsolete) Nausea And Vomiting    Antimicrobials this admission: 9/15 vancomycin  >> 9/15 cefepime >> Dose adjustments this admission:  Microbiology results: 9/15 BCx2:   Thank you for allowing pharmacy to be a part of this patient's care.  Rosaline IVAR Edison, Pharm.D Use secure chat for questions 03/16/2024 7:33 PM

## 2024-03-16 NOTE — ED Triage Notes (Signed)
 Pt sen on the 11th with cellulitis. Went to [place] over weekend. Presents today with red angry lower rt inner rt leg. Now has several small pustulars areas, redness above toes.

## 2024-03-17 ENCOUNTER — Encounter (HOSPITAL_COMMUNITY): Payer: Self-pay

## 2024-03-17 ENCOUNTER — Other Ambulatory Visit (HOSPITAL_COMMUNITY): Payer: Self-pay

## 2024-03-17 ENCOUNTER — Inpatient Hospital Stay (HOSPITAL_COMMUNITY): Payer: Self-pay

## 2024-03-17 LAB — CBC
HCT: 39.4 % (ref 39.0–52.0)
Hemoglobin: 11.9 g/dL — ABNORMAL LOW (ref 13.0–17.0)
MCH: 26.1 pg (ref 26.0–34.0)
MCHC: 30.2 g/dL (ref 30.0–36.0)
MCV: 86.4 fL (ref 80.0–100.0)
Platelets: 283 K/uL (ref 150–400)
RBC: 4.56 MIL/uL (ref 4.22–5.81)
RDW: 14.3 % (ref 11.5–15.5)
WBC: 14.9 K/uL — ABNORMAL HIGH (ref 4.0–10.5)
nRBC: 0 % (ref 0.0–0.2)

## 2024-03-17 LAB — BASIC METABOLIC PANEL WITH GFR
Anion gap: 10 (ref 5–15)
BUN: 8 mg/dL (ref 6–20)
CO2: 24 mmol/L (ref 22–32)
Calcium: 8.2 mg/dL — ABNORMAL LOW (ref 8.9–10.3)
Chloride: 97 mmol/L — ABNORMAL LOW (ref 98–111)
Creatinine, Ser: 0.69 mg/dL (ref 0.61–1.24)
GFR, Estimated: >60 mL/min (ref >60)
Glucose, Bld: 424 mg/dL — ABNORMAL HIGH (ref 70–99)
Potassium: 3.7 mmol/L (ref 3.5–5.1)
Sodium: 130 mmol/L — ABNORMAL LOW (ref 135–145)

## 2024-03-17 LAB — GLUCOSE, CAPILLARY
Glucose-Capillary: 316 mg/dL — ABNORMAL HIGH (ref 70–99)
Glucose-Capillary: 256 mg/dL — ABNORMAL HIGH (ref 70–99)
Glucose-Capillary: 278 mg/dL — ABNORMAL HIGH (ref 70–99)
Glucose-Capillary: 289 mg/dL — ABNORMAL HIGH (ref 70–99)

## 2024-03-17 LAB — HIV ANTIBODY (ROUTINE TESTING W REFLEX): HIV Screen 4th Generation wRfx: NONREACTIVE

## 2024-03-17 LAB — C-REACTIVE PROTEIN: CRP: 10.2 mg/dL — ABNORMAL HIGH (ref <1.0)

## 2024-03-17 LAB — LACTIC ACID, PLASMA: Lactic Acid, Venous: 1.8 mmol/L (ref 0.5–1.9)

## 2024-03-17 LAB — HEMOGLOBIN A1C
Hgb A1c MFr Bld: 12.3 % — ABNORMAL HIGH (ref 4.8–5.6)
Mean Plasma Glucose: 306.31 mg/dL

## 2024-03-17 LAB — SEDIMENTATION RATE: Sed Rate: 65 mm/h — ABNORMAL HIGH (ref 0–16)

## 2024-03-17 MED ORDER — INSULIN GLARGINE 100 UNIT/ML ~~LOC~~ SOLN
25.0000 [IU] | Freq: Every day | SUBCUTANEOUS | Status: DC
  Administered 2024-03-17: 25 [IU] via SUBCUTANEOUS
  Filled 2024-03-17: qty 0.25

## 2024-03-17 MED ORDER — LACTATED RINGERS IV SOLN: INTRAVENOUS | Status: DC

## 2024-03-17 MED ORDER — LACTATED RINGERS IV BOLUS
500.0000 mL | Freq: Once | INTRAVENOUS | Status: AC
  Administered 2024-03-17: 500 mL via INTRAVENOUS

## 2024-03-17 MED ORDER — LIVING WELL WITH DIABETES BOOK
Freq: Once | Status: AC
  Filled 2024-03-17: qty 1

## 2024-03-17 MED ORDER — INSULIN GLARGINE-YFGN 100 UNIT/ML ~~LOC~~ SOPN
25.0000 [IU] | PEN_INJECTOR | SUBCUTANEOUS | Status: DC
  Filled 2024-03-17: qty 3

## 2024-03-17 MED ORDER — INSULIN GLARGINE 100 UNIT/ML ~~LOC~~ SOLN
30.0000 [IU] | Freq: Every day | SUBCUTANEOUS | Status: DC
  Administered 2024-03-18 – 2024-03-19 (×2): 30 [IU] via SUBCUTANEOUS
  Filled 2024-03-17 (×2): qty 0.3

## 2024-03-17 NOTE — Plan of Care (Signed)
  Problem: Clinical Measurements: Goal: Diagnostic test results will improve Outcome: Progressing Goal: Cardiovascular complication will be avoided Outcome: Progressing   

## 2024-03-17 NOTE — Progress Notes (Signed)
 PROGRESS NOTE    Patient: Zachary Schneider                            PCP: Pcp, No                    DOB: 09-04-1970            DOA: 03/16/2024 FMW:[id number]             DOS: 03/17/2024, 12:10 PM   LOS: 1 day   Date of Service: The patient was seen and examined on 03/17/2024  Subjective:   The patient was seen and examined this morning. Hemodynamically stable. Still complaining of lower extremity tenderness erythema edema   Brief Narrative:    RAYFIELD BEEM is a 53 y.o. male with medical history significant for testicular cancer and poorly controlled type 2 diabetes mellitus who presents with worsening redness, swelling, and pain involving the lower right leg.   Patient was seen on 03/12/2024 with 1 to 2 days of right calf redness, swelling, and pain.  He was diagnosed with cellulitis and started on doxycycline  at that time but now returns with worsening in the symptoms.   He denies fever or chills and, aside from the right lower leg, has not noticed any other areas of concern.    ED Course: Patient is found to be afebrile and saturating mid 90s on room air with normal HR and stable BP.  Labs are most notable for glucose 407, normal creatinine, WBC 13,900, and lactic acid 2.4.  There is no gas or acute osseous abnormality noted on plain radiographs of the right foot and right lower leg.   Blood culture was collected and the patient was given 2 L of NS, Vancomycin , and Cefepime .     Assessment & Plan:   Principal Problem:   Cellulitis of right lower extremity Active Problems:   Uncontrolled type 2 diabetes mellitus with hyperglycemia (HCC)    Assessment/Plan    RLE cellulitis  Complicated by uncontrolled diabetes mellitus - On exam extensive right lower extremity below the knee above ankle erythema edema superficial boil, abscess -Afebrile, normotensive WBC 14.9, -Will follow-up with cultures -Patient has been on broad-spectrum antibiotics of  vancomycin /vancomycin  -Consulted orthopedic surgery Dr. Georgina for further evaluation recommendations, possible I&D of an abscess   X-ray of the right tib- Moderate subcutaneous edema about the calf. No subcutaneous gas or radiographic findings of osteomyelitis, at this time.    Type II DM  -Remain hyperglycemic-uncontrolled DM2 with A1c of 12.3 -Adding long-acting insulin , SSI coverage - Checking CBG q. ACHS - Serum glucose is 407 in ED without DKA  - starting long acting Insulin  at 25 u daily  - Diabetic coordinator consulted  Noncompliant with home meds-  (Home medication includes 70/30 75 units twice daily patient noncompliant, also metformin  500 daily)  ----------------------------------------------------------------------------------------------------------------------------------------------- Nutritional status:  The patient's BMI is: Body mass index is 32.98 kg/m. I agree with the assessment and plan as outlined  Nutrition Status:     ------------------------------------------------------------------------------------------------------------------------------------------------  DVT prophylaxis:     Code Status:   Code Status: Full Code  Family Communication: No family member present at bedside-  -Advance care planning has been discussed.   Admission status:   Status is: Inpatient Remains inpatient appropriate because: Needing IV antibiotics IV fluids, orthopedic evaluation possible I&D   Disposition: From  - home  Planning for discharge in 1-2 days   Procedures:   No admission procedures for hospital encounter.   Antimicrobials:  Anti-infectives (From admission, onward)    Start     Dose/Rate Route Frequency Ordered Stop   03/17/24 0600  vancomycin  (VANCOREADY) IVPB 1500 mg/300 mL        1,500 mg 150 mL/hr over 120 Minutes Intravenous Every 12 hours 03/16/24 1932     03/17/24 0000  ceFEPIme  (MAXIPIME ) 2 g in sodium chloride  0.9 % 100 mL  IVPB        2 g 200 mL/hr over 30 Minutes Intravenous Every 8 hours 03/16/24 1929     03/16/24 1545  vancomycin  (VANCOCIN ) IVPB 1000 mg/200 mL premix  Status:  Discontinued        1,000 mg 200 mL/hr over 60 Minutes Intravenous  Once 03/16/24 1541 03/16/24 1542   03/16/24 1545  ceFEPIme  (MAXIPIME ) 2 g in sodium chloride  0.9 % 100 mL IVPB        2 g 200 mL/hr over 30 Minutes Intravenous  Once 03/16/24 1541 03/16/24 1723   03/16/24 1545  vancomycin  (VANCOREADY) IVPB 2000 mg/400 mL        2,000 mg 200 mL/hr over 120 Minutes Intravenous  Once 03/16/24 1542 03/16/24 1934        Medication:   influenza vac split trivalent PF  0.5 mL Intramuscular Tomorrow-1000   insulin  aspart  0-5 Units Subcutaneous QHS   insulin  aspart  0-6 Units Subcutaneous TID WC   insulin  glargine  25 Units Subcutaneous Daily   sodium chloride  flush  3 mL Intravenous Q12H    acetaminophen  **OR** acetaminophen , HYDROmorphone  (DILAUDID ) injection, oxyCODONE , polyethylene glycol, prochlorperazine    Objective:   Vitals:   03/16/24 2125 03/17/24 0107 03/17/24 0522 03/17/24 0942  BP: 138/78 138/81 129/77 135/74  Pulse: 79 (!) 104 65 63  Resp: 18 18 16 17   Temp: 98.8 F (37.1 C) 98.1 F (36.7 C) 98.3 F (36.8 C) 98.2 F (36.8 C)  TempSrc: Oral Oral Oral Oral  SpO2: 99% 96% 94% 95%  Weight:      Height:        Intake/Output Summary (Last 24 hours) at 03/17/2024 1210 Last data filed at 03/17/2024 1000 Gross per 24 hour  Intake 2251.43 ml  Output 0 ml  Net 2251.43 ml   Filed Weights   03/16/24 1524  Weight: 113.4 kg     Physical examination:   General:  AAO x 3,  cooperative, no distress;   HEENT:  Normocephalic, PERRL, otherwise with in Normal limits   Neuro:  CNII-XII intact. , normal motor and sensation, reflexes intact   Lungs:   Clear to auscultation BL, Respirations unlabored,  No wheezes / crackles  Cardio:    S1/S2, RRR, No murmure, No Rubs or Gallops   Abdomen:  Soft, non-tender, bowel  sounds active all four quadrants, no guarding or peritoneal signs.  Muscular  skeletal:  Bilateral first digit, great toe amputated Limited exam -global generalized weaknesses - in bed, able to move all 4 extremities,   2+ pulses,  symmetric, No pitting edema  Skin:  Dry, warm to touch, right lower extremity erythema edema superficial boil, tenderness  Wounds: Right lower extremity below the knee above ankle erythema edema with visible boil abscess superficially--tender to touch       ------------------------------------------------------------------------------------------------------------------------------------------    LABs:     Latest Ref Rng & Units 03/17/2024   12:52 AM 03/16/2024    4:57 PM 03/12/2024  9:07 AM  CBC  WBC 4.0 - 10.5 K/uL 14.9  13.9  15.5   Hemoglobin 13.0 - 17.0 g/dL 88.0  86.4  85.5   Hematocrit 39.0 - 52.0 % 39.4  42.7  45.1   Platelets 150 - 400 K/uL 283  309  232       Latest Ref Rng & Units 03/17/2024   12:52 AM 03/16/2024    4:57 PM 03/12/2024   10:00 AM  CMP  Glucose 70 - 99 mg/dL 575  592  679   BUN 6 - 20 mg/dL 8  9  8    Creatinine 0.61 - 1.24 mg/dL 9.30  9.38  9.33   Sodium 135 - 145 mmol/L 130  134  132   Potassium 3.5 - 5.1 mmol/L 3.7  3.9  3.7   Chloride 98 - 111 mmol/L 97  96  96   CO2 22 - 32 mmol/L 24  25  23    Calcium  8.9 - 10.3 mg/dL 8.2  9.2  8.9   Total Protein 6.5 - 8.1 g/dL  7.0    Total Bilirubin 0.0 - 1.2 mg/dL  0.6    Alkaline Phos 38 - 126 U/L  91    AST 15 - 41 U/L  13    ALT 0 - 44 U/L  12         Micro Results Recent Results (from the past 240 hours)  Blood Culture (routine x 2)     Status: None (Preliminary result)   Collection Time: 03/16/24  4:57 PM   Specimen: BLOOD  Result Value Ref Range Status   Specimen Description   Final    BLOOD BLOOD RIGHT ARM Performed at [facility], [address], [place], [place] [address]    Special Requests   Final    BOTTLES DRAWN AEROBIC AND  ANAEROBIC Blood Culture adequate volume Performed at [facility], [address], [place], [place] [address]    Culture   Final    NO GROWTH < 12 HOURS Performed at [facility], [address], [place], [place] [address]    Report Status PENDING  Incomplete  Blood Culture (routine x 2)     Status: None (Preliminary result)   Collection Time: 03/16/24 10:18 PM   Specimen: BLOOD  Result Value Ref Range Status   Specimen Description   Final    BLOOD RIGHT ANTECUBITAL Performed at [facility], [address] [place], [place] [address]    Special Requests   Final    Blood Culture results may not be optimal due to an inadequate volume of blood received in culture bottles BOTTLES DRAWN AEROBIC AND ANAEROBIC Performed at [facility], [address] [place], [place] [address]    Culture   Final    NO GROWTH < 12 HOURS Performed at [facility], [address], [place], [place] [address]    Report Status PENDING  Incomplete    Radiology Reports DG Tibia/Fibula Right Result Date: 03/16/2024 CLINICAL DATA:  right toe lac/cellulitis EXAM: RIGHT TIBIA AND FIBULA - 2 VIEW COMPARISON:  March 12, 2024 FINDINGS: No acute fracture or dislocation. Redemonstrated sequelae of remote trauma along the medial femoral condyle. There is no evidence of arthropathy or other focal bone abnormality. Moderate subcutaneous edema about the calf. No radiopaque gas or radiopaque foreign body. IMPRESSION: Moderate subcutaneous edema about the calf. No subcutaneous gas or radiographic findings of osteomyelitis, at this  time. Electronically Signed   By: Rogelia Myers M.D.   On: 03/16/2024 16:49   DG Foot 2 Views Right Result Date: 03/16/2024 CLINICAL DATA:  right toe lac/cellulitis EXAM: RIGHT FOOT - 2 VIEW COMPARISON:  12/17/2017 FINDINGS: Interval amputation of the great toe.No acute fracture or dislocation. Degenerative spurring along the  midfoot. Moderate soft tissue swelling. No subcutaneous gas. IMPRESSION: Moderate soft tissue swelling about the foot. No subcutaneous gas or radiographic findings of osteomyelitis, at this time. Electronically Signed   By: Rogelia Myers M.D.   On: 03/16/2024 16:47    SIGNED: Adriana DELENA Grams, MD, FHM. FAAFP. Jolynn Pack - [facility] Time spent - 55 min.  In seeing, evaluating and examining the patient. Reviewing medical records, labs, drawn plan of care. [facility],  Pager (please use [email or web address] to page/ text) Please use Epic Secure Chat for non-urgent communication (7AM-7PM)  If 7PM-7AM, please contact night-coverage [email or web address], 03/17/2024, 12:10 PM

## 2024-03-17 NOTE — Inpatient Diabetes Management (Addendum)
 Inpatient Diabetes Program Recommendations  AACE/ADA: New Consensus Statement on Inpatient Glycemic Control (2015)  Target Ranges:  Prepandial:   less than 140 mg/dL      Peak postprandial:   less than 180 mg/dL (1-2 hours)      Critically ill patients:  140 - 180 mg/dL   Lab Results  Component Value Date   GLUCAP 408 (H) 03/16/2024   HGBA1C 12.3 (H) 03/17/2024    Review of Glycemic Control  Latest Reference Range & Units 03/16/24 20:29 03/16/24 22:37  Glucose-Capillary 70 - 99 mg/dL 654 (H) 591 (H)  (H): Data is abnormally high  Diabetes history: DM2 Outpatient Diabetes medications: 70/30--75 units BID, Metformin  500 mg BID (not taking DM meds) Current orders for Inpatient glycemic control: Novolog  0-6 units TID and 0-5 QHS  Inpatient Diabetes Program Recommendations:    Please consider:  Glargine 25 units BID  Novolog  0-9 units TID and 0-5 units at bedtime Novolog  6 units TID with meals if he consumes at least 50%  Will see him later today.  Addendum@12 :12:  Met with patient and significant other, [name] at bedside.  Patient has just received pain medication and is drowsy.  He does not have insurance and has difficulty affording his insulin  and needles.  [name] states he stretches his insulin  out and does not take on a regular basis.  He drinks regular [facility] and does not follow a healthy diet.  Reviewed patient's current A1c of 12.3%. Explained what a A1c is and what it measures. Also reviewed goal A1c with patient, importance of good glucose control @ home, and blood sugar goals.  Educated on The Plate Method, CHO's, portion control, avoiding caloric beverages, CBGs at home fasting and mid afternoon, F/U with PCP every 3 months, bring meter to PCP office, long and short term complications of uncontrolled BG, and importance of exercise.  Ordered the [facility] booklet.  He has a glucometer at home and checks his glucose occasionally.    Will follow up with patient tomorrow.   Placed a TOC consult for medication assistance and PCP-[facility].    Thank you, Wyvonna Pinal, MSN, CDCES Diabetes Coordinator Inpatient Diabetes Program [phone] (team pager from 8a-5p)

## 2024-03-17 NOTE — Plan of Care (Signed)
  Problem: Education: Goal: Ability to describe self-care measures that may prevent or decrease complications (Diabetes Survival Skills Education) will improve Outcome: Progressing   Problem: Skin Integrity: Goal: Risk for impaired skin integrity will decrease Outcome: Progressing   Problem: Clinical Measurements: Goal: Ability to maintain clinical measurements within normal limits will improve Outcome: Progressing   Problem: Nutrition: Goal: Adequate nutrition will be maintained Outcome: Progressing   Problem: Pain Managment: Goal: General experience of comfort will improve and/or be controlled Outcome: Progressing   Problem: Safety: Goal: Ability to remain free from injury will improve Outcome: Progressing   Problem: Skin Integrity: Goal: Risk for impaired skin integrity will decrease Outcome: Progressing

## 2024-03-17 NOTE — Consult Note (Addendum)
 WOC Nurse Consult Note - update: Reason for Consult: R leg wounds Wound type: blisters due leg infection started drain. Pressure Injury POA: NA Measurement: medial bulla 2 cm x 2 cm draining a purulent exudate, removed the skin and expressed the area, allowing the fluid out. Wound bed: 100% dark red non granulate tissue. 2 pustules on ankle, draining the same purulent fluid. Drainage (amount, consistency, odor) Moderate purulent milky fluid, light yellow, odor present. Periwound: erythema, edema 3+/4+. Painful when manipulated. Dressing procedure/placement/frequency: Cleanse R lower leg pustules with Vashe wound cleanser Soila [phone]) do not rinse and allow to air dry. Apply barrier pad [phone]) daily to peri-wound skin to protect against the effluent. Add a non adherent dressing Mepitel [phone] on the bulla ruptured.  Cover with ABD pad or gauze, and wrap with Kerlix to hold the dressing, change daily or PRN.  WOC team will not plan to follow further. Please reconsult if further assistance is needed. Thank-you,  Lela Holm RN, CNS, [employer], MSN.  (Phone [phone])

## 2024-03-17 NOTE — Progress Notes (Signed)
 Ortho Consult Note  Patient with poorly controlled diabetes and had cellulitis. He represented to the hospital with persistent erythema and now drainage around his right distal leg. He does have cellulitis with two areas with drainage over the distal medial right leg. 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 No plantar ulcers seen. NAD, breathing comfortably on room air. No palpable crepitus. X-rays did not show any evidence of osteomyelitis.    Recommend transfer to [facility] Ordered ABIs, CRP, ESR NPO at midnight WBAT RLE Possible operative management tomorrow   Ozell DELENA Ada, MD Orthopedic Surgeon

## 2024-03-17 NOTE — TOC Initial Note (Addendum)
 Transition of Care [facility]) - Initial/Assessment Note    Patient Details  Name: Zachary Schneider MRN: [id number] Date of Birth: [birth year]  [facility] CM/SW Contact:    Zachary JONELLE Rex, RN Phone Number: 03/17/2024, 12:24 PM  Clinical Narrative:  TOC Consult for Medication Assistance for Insulin -will need MATCH for dc meds; Assistance in apply for Medicaid-Email sent to Financial Navigator requesting screen for Medicaid eligibility; schedule appointment at [facility]. NCM called to clinic, not currently accepting new patients. Paient has Zachary Schneider, Zachary Schneider address. Appointment scheduled at:  [facility], Tuesday, March 24, 2024 at 11:00am, added to AVS.         -3:30pm Met with patient at bedside to introduce role of TOC/NCM and review for dc planning, patient had his significant other, Zachary Schneider, on his mobile phone speaker during assessment. Patient confirmed he resides in between Zachary Schneider with his Aunt and Zachary Schneider with his significant other. Informed patient NCM scheduled an appointment at [facility] on 9/23 at 11am, Zachary Schneider confirmed they will be able to keep the appointment, reviewed referral sent to hospital financial navigator to screen for Medicaid eligibility and will assist patient with discharge medications through [facility] program, patient/Zachary Schneider voiced understanding. Zachary Schneider inquiring if their are resources for assistance with hospital bill. NCM informed unaware of any resources that will assist with hospital bill, reviewed that once hospital bill received payment arrangements can be made with billing office. Patient reports no current home care services or home DME, states at baseline he is independent with his care and functional mobility. Reports he feels safe returning home with support from family and significant other. TOC will continue to follow.                  Patient Goals and CMS Choice             Expected Discharge Plan and Services                                              Prior Living Arrangements/Services                       Activities of Daily Living   ADL Screening (condition at time of admission) Independently performs ADLs?: Yes (appropriate for developmental age) Is the patient deaf or have difficulty hearing?: No Does the patient have difficulty seeing, even when wearing glasses/contacts?: No Does the patient have difficulty concentrating, remembering, or making decisions?: No  Permission Sought/Granted                  Emotional Assessment              Admission diagnosis:  Cellulitis of right lower extremity [L03.115] Patient Active Problem List   Diagnosis Date Noted   Hyperglycemia    Cellulitis of right lower extremity    Uncontrolled type 2 diabetes mellitus with hyperglycemia (HCC) 12/18/2017   Diabetic foot infection (HCC) 12/18/2017   Sepsis due to undetermined organism (HCC) 12/18/2017   Diabetic foot ulcer (HCC) 12/16/2017   Type 2 diabetes mellitus (HCC) 12/16/2017   Tobacco use 12/16/2017   BMI 39.0-39.9,adult 12/16/2017   SBO (small bowel obstruction) (HCC) 07/14/2017   Ileus (HCC) 07/14/2017   PCP:  Pcp, No Pharmacy:   [id number] - [place], [place] -  [address] [place] [address] Phone: [phone] Fax: [phone]     Social Drivers of Health (SDOH) Social History: SDOH Screenings   Food Insecurity: No Food Insecurity (03/16/2024)  Housing: Low Risk  (03/16/2024)  Transportation Needs: No Transportation Needs (03/16/2024)  Utilities: Not At Risk (03/16/2024)  Social Connections: Moderately Isolated (03/16/2024)  Tobacco Use: High Risk (03/16/2024)   SDOH Interventions:     Readmission Risk Interventions     No data to display

## 2024-03-17 NOTE — Hospital Course (Signed)
 Zachary Schneider is a 53 y.o. male with medical history significant for testicular cancer and poorly controlled type 2 diabetes mellitus who presents with worsening redness, swelling, and pain involving the lower right leg.   Patient was seen on 03/12/2024 with 1 to 2 days of right calf redness, swelling, and pain.  He was diagnosed with cellulitis and started on doxycycline  at that time but now returns with worsening in the symptoms.   He denies fever or chills and, aside from the right lower leg, has not noticed any other areas of concern.    ED Course: Patient is found to be afebrile and saturating mid 90s on room air with normal HR and stable BP.  Labs are most notable for glucose 407, normal creatinine, WBC 13,900, and lactic acid 2.4.  There is no gas or acute osseous abnormality noted on plain radiographs of the right foot and right lower leg.   Blood culture was collected and the patient was given 2 L of NS, Vancomycin , and Cefepime .   Assessment/Plan    RLE cellulitis   - Continue empiric antibiotics, follow culture and clinical course, obtain advanced imaging if worsens or fails to improve as expected     Type II DM  - Serum glucose is 407 in ED without DKA  - Check CBGs and use SSI for now  - starting long acting Insulin  at 25 u daily

## 2024-03-17 NOTE — Consult Note (Signed)
 WOC Nurse Consult Note: Reason for Consult: R plantar foot and R leg wounds  Wound type: 1. Full thickness diabetic ulcer R plantar foot  2.  Full thickness R lower leg infectious  Pressure Injury POA: NA not pressure  Measurement: see nursing flowsheet  Wound bed: linear dark callused area R plantar foot; pustules noted to R lower leg  Drainage (amount, consistency, odor) see nursing flowsheet  Periwound: erythema and edema to R lower leg  Dressing procedure/placement/frequency: Paint R plantar foot wound with Betadine  2 times daily, allow to air dry and cover with silicone foam.  Cleanse R lower leg pustules with Vashe wound cleanser Soila [phone]) do not rinse and allow to air dry.  Apply Xeroform gauze ([place] [phone]) to pustules/wound bed daily cover with dry gauze and secure with silicone foam or Kerlix roll gauze whichever is preferred.  POC discussed with bedside nurse. WOC team will not follow. Re-consult if further needs arise.   Thank you,    Powell Bar MSN, RN-BC, [employer]

## 2024-03-18 ENCOUNTER — Encounter (HOSPITAL_COMMUNITY): Payer: Self-pay

## 2024-03-18 ENCOUNTER — Encounter (HOSPITAL_COMMUNITY): Payer: Self-pay | Admitting: Family Medicine

## 2024-03-18 ENCOUNTER — Inpatient Hospital Stay (HOSPITAL_COMMUNITY): Payer: Self-pay | Admitting: Registered Nurse

## 2024-03-18 ENCOUNTER — Other Ambulatory Visit: Payer: Self-pay

## 2024-03-18 ENCOUNTER — Encounter (HOSPITAL_COMMUNITY)
Admission: EM | Disposition: A | Discharge status: Home / Self Care | Payer: Self-pay | Source: Home / Self Care | Attending: Family Medicine

## 2024-03-18 DIAGNOSIS — L02411 Cutaneous abscess of right axilla: Secondary | ICD-10-CM

## 2024-03-18 DIAGNOSIS — L02416 Cutaneous abscess of left lower limb: Secondary | ICD-10-CM

## 2024-03-18 HISTORY — PX: INCISION AND DRAINAGE OF WOUND: SHX1803

## 2024-03-18 LAB — CBC
HCT: 35 % — ABNORMAL LOW (ref 39.0–52.0)
Hemoglobin: 11.1 g/dL — ABNORMAL LOW (ref 13.0–17.0)
MCH: 27.1 pg (ref 26.0–34.0)
MCHC: 31.7 g/dL (ref 30.0–36.0)
MCV: 85.4 fL (ref 80.0–100.0)
Platelets: 272 K/uL (ref 150–400)
RBC: 4.1 MIL/uL — ABNORMAL LOW (ref 4.22–5.81)
RDW: 14.6 % (ref 11.5–15.5)
WBC: 12.7 K/uL — ABNORMAL HIGH (ref 4.0–10.5)
nRBC: 0 % (ref 0.0–0.2)

## 2024-03-18 LAB — GLUCOSE, CAPILLARY
Glucose-Capillary: 237 mg/dL — ABNORMAL HIGH (ref 70–99)
Glucose-Capillary: 252 mg/dL — ABNORMAL HIGH (ref 70–99)
Glucose-Capillary: 230 mg/dL — ABNORMAL HIGH (ref 70–99)
Glucose-Capillary: 205 mg/dL — ABNORMAL HIGH (ref 70–99)
Glucose-Capillary: 148 mg/dL — ABNORMAL HIGH (ref 70–99)
Glucose-Capillary: 230 mg/dL — ABNORMAL HIGH (ref 70–99)
Glucose-Capillary: 236 mg/dL — ABNORMAL HIGH (ref 70–99)

## 2024-03-18 LAB — BASIC METABOLIC PANEL WITH GFR
Anion gap: 11 (ref 5–15)
BUN: 6 mg/dL (ref 6–20)
CO2: 24 mmol/L (ref 22–32)
Calcium: 8.2 mg/dL — ABNORMAL LOW (ref 8.9–10.3)
Chloride: 101 mmol/L (ref 98–111)
Creatinine, Ser: 0.53 mg/dL — ABNORMAL LOW (ref 0.61–1.24)
GFR, Estimated: >60 mL/min (ref >60)
Glucose, Bld: 268 mg/dL — ABNORMAL HIGH (ref 70–99)
Potassium: 3.6 mmol/L (ref 3.5–5.1)
Sodium: 136 mmol/L (ref 135–145)

## 2024-03-18 SURGERY — IRRIGATION AND DEBRIDEMENT WOUND
Anesthesia: General | Laterality: Right

## 2024-03-18 MED ORDER — PROPOFOL 10 MG/ML IV BOLUS
INTRAVENOUS | Status: DC | PRN
  Administered 2024-03-18: 200 mg via INTRAVENOUS

## 2024-03-18 MED ORDER — LACTATED RINGERS IV SOLN: INTRAVENOUS | Status: DC

## 2024-03-18 MED ORDER — CEFAZOLIN SODIUM-DEXTROSE 2-4 GM/100ML-% IV SOLN
2.0000 g | INTRAVENOUS | Status: AC
  Administered 2024-03-18: 2 g via INTRAVENOUS
  Filled 2024-03-18: qty 100

## 2024-03-18 MED ORDER — FENTANYL CITRATE (PF) 100 MCG/2ML IJ SOLN
25.0000 ug | INTRAMUSCULAR | Status: DC | PRN
  Administered 2024-03-18: 50 ug via INTRAVENOUS
  Administered 2024-03-18: 25 ug via INTRAVENOUS

## 2024-03-18 MED ORDER — FENTANYL CITRATE (PF) 250 MCG/5ML IJ SOLN
INTRAMUSCULAR | Status: AC
  Filled 2024-03-18: qty 5

## 2024-03-18 MED ORDER — CHLORHEXIDINE GLUCONATE 4 % EX SOLN
60.0000 mL | Freq: Once | CUTANEOUS | Status: DC
Start: 2024-03-18 — End: 2024-03-18

## 2024-03-18 MED ORDER — VASHE WOUND IRRIGATION OPTIME
TOPICAL | Status: DC | PRN
  Administered 2024-03-18: 34 [oz_av]

## 2024-03-18 MED ORDER — PHENYLEPHRINE 80 MCG/ML (10ML) SYRINGE FOR IV PUSH (FOR BLOOD PRESSURE SUPPORT)
PREFILLED_SYRINGE | INTRAVENOUS | Status: DC | PRN
  Administered 2024-03-18: 80 ug via INTRAVENOUS

## 2024-03-18 MED ORDER — 0.9 % SODIUM CHLORIDE (POUR BTL) OPTIME
TOPICAL | Status: DC | PRN
  Administered 2024-03-18: 1000 mL

## 2024-03-18 MED ORDER — HYDROMORPHONE HCL 1 MG/ML IJ SOLN
0.5000 mg | INTRAMUSCULAR | Status: DC | PRN
  Administered 2024-03-18 – 2024-03-22 (×7): 1 mg via INTRAVENOUS
  Filled 2024-03-18 (×7): qty 1

## 2024-03-18 MED ORDER — ORAL CARE MOUTH RINSE: 15.0000 mL | Freq: Once | OROMUCOSAL | Status: AC

## 2024-03-18 MED ORDER — INSULIN ASPART 100 UNIT/ML IJ SOLN
2.0000 [IU] | Freq: Once | INTRAMUSCULAR | Status: AC
  Administered 2024-03-18: 2 [IU] via SUBCUTANEOUS

## 2024-03-18 MED ORDER — LIDOCAINE 2% (20 MG/ML) 5 ML SYRINGE
INTRAMUSCULAR | Status: DC | PRN
  Administered 2024-03-18: 60 mg via INTRAVENOUS

## 2024-03-18 MED ORDER — OXYCODONE HCL 5 MG PO TABS: 5.0000 mg | ORAL_TABLET | Freq: Once | ORAL | Status: DC | PRN

## 2024-03-18 MED ORDER — MIDAZOLAM HCL 2 MG/2ML IJ SOLN
INTRAMUSCULAR | Status: DC | PRN
Start: 2024-03-18 — End: 2024-03-18
  Administered 2024-03-18: 2 mg via INTRAVENOUS

## 2024-03-18 MED ORDER — PROPOFOL 10 MG/ML IV BOLUS
INTRAVENOUS | Status: AC
  Filled 2024-03-18: qty 20

## 2024-03-18 MED ORDER — OXYCODONE HCL 5 MG/5ML PO SOLN: 5.0000 mg | Freq: Once | ORAL | Status: DC | PRN

## 2024-03-18 MED ORDER — ACETAMINOPHEN 10 MG/ML IV SOLN: 1000.0000 mg | Freq: Once | INTRAVENOUS | Status: DC | PRN

## 2024-03-18 MED ORDER — INSULIN ASPART 100 UNIT/ML IJ SOLN
0.0000 [IU] | INTRAMUSCULAR | Status: DC | PRN
  Administered 2024-03-18: 6 [IU] via SUBCUTANEOUS
  Filled 2024-03-18: qty 1

## 2024-03-18 MED ORDER — MIDAZOLAM HCL 2 MG/2ML IJ SOLN
INTRAMUSCULAR | Status: AC
  Filled 2024-03-18: qty 2

## 2024-03-18 MED ORDER — CHLORHEXIDINE GLUCONATE 0.12 % MT SOLN
15.0000 mL | Freq: Once | OROMUCOSAL | Status: AC
  Administered 2024-03-18: 15 mL via OROMUCOSAL

## 2024-03-18 MED ORDER — FENTANYL CITRATE (PF) 100 MCG/2ML IJ SOLN
INTRAMUSCULAR | Status: AC
  Filled 2024-03-18: qty 2

## 2024-03-18 MED ORDER — POVIDONE-IODINE 10 % EX SWAB
2.0000 | Freq: Once | CUTANEOUS | Status: DC
Start: 2024-03-18 — End: 2024-03-18

## 2024-03-18 MED ORDER — FENTANYL CITRATE (PF) 250 MCG/5ML IJ SOLN
INTRAMUSCULAR | Status: DC | PRN
  Administered 2024-03-18 (×3): 50 ug via INTRAVENOUS

## 2024-03-18 MED ORDER — METHOCARBAMOL 1000 MG/10ML IJ SOLN
500.0000 mg | Freq: Four times a day (QID) | INTRAMUSCULAR | Status: DC | PRN
  Administered 2024-03-18: 500 mg via INTRAVENOUS
  Filled 2024-03-18: qty 10

## 2024-03-18 MED ORDER — HEPARIN SODIUM (PORCINE) 5000 UNIT/ML IJ SOLN
5000.0000 [IU] | Freq: Three times a day (TID) | INTRAMUSCULAR | Status: DC
  Administered 2024-03-18 – 2024-03-20 (×6): 5000 [IU] via SUBCUTANEOUS
  Filled 2024-03-18 (×6): qty 1

## 2024-03-18 MED ORDER — EPHEDRINE SULFATE-NACL 50-0.9 MG/10ML-% IV SOSY
PREFILLED_SYRINGE | INTRAVENOUS | Status: DC | PRN
Start: 2024-03-18 — End: 2024-03-18
  Administered 2024-03-18 (×3): 5 mg via INTRAVENOUS

## 2024-03-18 SURGICAL SUPPLY — 40 items
BAG COUNTER SPONGE SURGICOUNT (BAG) IMPLANT
BLADE SURG 21 STRL SS (BLADE) ×2 IMPLANT
BNDG COHESIVE 4X5 TAN STRL LF (GAUZE/BANDAGES/DRESSINGS) IMPLANT
BNDG COHESIVE 6X5 TAN NS LF (GAUZE/BANDAGES/DRESSINGS) IMPLANT
BNDG COHESIVE 6X5 TAN ST LF (GAUZE/BANDAGES/DRESSINGS) IMPLANT
BNDG GAUZE DERMACEA FLUFF 4 (GAUZE/BANDAGES/DRESSINGS) IMPLANT
CANISTER WOUNDNEG PRESSURE 500 (CANNISTER) IMPLANT
CLEANSER WND VASHE 34 (WOUND CARE) IMPLANT
CLEANSER WND VASHE INSTL 34OZ (WOUND CARE) IMPLANT
COVER SURGICAL LIGHT HANDLE (MISCELLANEOUS) ×4 IMPLANT
DRAPE DERMATAC (DRAPES) IMPLANT
DRAPE INCISE IOBAN 66X45 STRL (DRAPES) IMPLANT
DRAPE U-SHAPE 47X51 STRL (DRAPES) ×2 IMPLANT
DRESSING PREVENA PLUS CUSTOM (GAUZE/BANDAGES/DRESSINGS) IMPLANT
DRESSING VERAFLO CLEANS CC MED (GAUZE/BANDAGES/DRESSINGS) IMPLANT
DRSG ADAPTIC 3X8 NADH LF (GAUZE/BANDAGES/DRESSINGS) ×2 IMPLANT
DRSG VAC PEEL AND PLACE LRG (GAUZE/BANDAGES/DRESSINGS) IMPLANT
DURAPREP 26ML APPLICATOR (WOUND CARE) ×2 IMPLANT
ELECTRODE REM PT RTRN 9FT ADLT (ELECTROSURGICAL) IMPLANT
GAUZE PAD ABD 8X10 STRL (GAUZE/BANDAGES/DRESSINGS) IMPLANT
GAUZE SPONGE 4X4 12PLY STRL (GAUZE/BANDAGES/DRESSINGS) IMPLANT
GLOVE BIOGEL PI IND STRL 9 (GLOVE) ×2 IMPLANT
GLOVE SURG ORTHO 9.0 STRL STRW (GLOVE) ×2 IMPLANT
GOWN STRL REUS W/ TWL XL LVL3 (GOWN DISPOSABLE) ×4 IMPLANT
GRAFT SKIN WND SURGICLOSE M95 (Tissue) IMPLANT
KIT BASIN OR (CUSTOM PROCEDURE TRAY) ×2 IMPLANT
KIT TURNOVER KIT B (KITS) ×2 IMPLANT
MANIFOLD NEPTUNE II (INSTRUMENTS) ×2 IMPLANT
NS IRRIG 1000ML POUR BTL (IV SOLUTION) ×2 IMPLANT
PACK ORTHO EXTREMITY (CUSTOM PROCEDURE TRAY) ×2 IMPLANT
PAD ARMBOARD POSITIONER FOAM (MISCELLANEOUS) ×4 IMPLANT
PAD NEG PRESSURE SENSATRAC (MISCELLANEOUS) IMPLANT
SET HNDPC FAN SPRY TIP SCT (DISPOSABLE) IMPLANT
STOCKINETTE IMPERVIOUS 9X36 MD (GAUZE/BANDAGES/DRESSINGS) IMPLANT
SUT ETHILON 2 0 PSLX (SUTURE) ×2 IMPLANT
SWAB COLLECTION DEVICE MRSA (MISCELLANEOUS) ×2 IMPLANT
SWAB CULTURE ESWAB REG 1ML (MISCELLANEOUS) IMPLANT
TOWEL GREEN STERILE (TOWEL DISPOSABLE) ×2 IMPLANT
TUBE CONNECTING 12X1/4 (SUCTIONS) ×2 IMPLANT
YANKAUER SUCT BULB TIP NO VENT (SUCTIONS) ×2 IMPLANT

## 2024-03-18 NOTE — Consult Note (Signed)
 ORTHOPAEDIC CONSULTATION  REQUESTING PHYSICIAN: Lue Elsie BROCKS, MD  Chief Complaint: Abscess right leg.  HPI: Zachary Schneider is a 53 y.o. male who presents with cellulitis, ulceration right lower extremity.  Patient states that his symptoms started on Thursday he started oral antibiotics and had rapidly progression of the pain swelling cellulitis and ulceration.  Past Medical History:  Diagnosis Date   Cancer [facility])    testicular   Diabetes mellitus without complication (HCC)    Obese    Tobacco use    Past Surgical History:  Procedure Laterality Date   INCISION AND DRAINAGE OF WOUND Right 12/18/2017   Procedure: DEBRIDEMENT OF INFECTED DIABETIC FOOT ULCER RIGHT FOOT;  Surgeon: Blinda Katz, DPM;  Location: [facility];  Service: Podiatry;  Laterality: Right;   KNEE SURGERY     TOE SURGERY Right    Social History   Socioeconomic History   Marital status: Divorced    Spouse name: Not on file   Number of children: Not on file   Years of education: Not on file   Highest education level: Not on file  Occupational History   Not on file  Tobacco Use   Smoking status: Some Days    Current packs/day: 0.50    Types: Cigarettes   Smokeless tobacco: Never  Vaping Use   Vaping status: Never Used  Substance and Sexual Activity   Alcohol use: No   Drug use: No   Sexual activity: Not on file  Other Topics Concern   Not on file  Social History Narrative   Not on file   Social Drivers of Health   Financial Resource Strain: Not on file  Food Insecurity: No Food Insecurity (03/16/2024)   Hunger Vital Sign    Worried About Running Out of Food in the Last Year: Never true    Ran Out of Food in the Last Year: Never true  Transportation Needs: No Transportation Needs (03/16/2024)   PRAPARE - Administrator, Civil Service (Medical): No    Lack of Transportation (Non-Medical): No  Physical Activity: Not on file  Stress: Not on file  Social Connections:  Moderately Isolated (03/16/2024)   Social Connection and Isolation Panel    Frequency of Communication with Friends and Family: More than three times a week    Frequency of Social Gatherings with Friends and Family: Once a week    Attends Religious Services: Never    Database administrator or Organizations: No    Attends Engineer, structural: Never    Marital Status: Living with partner   Family History  Problem Relation Age of Onset   Breast cancer Mother    Multiple sclerosis Father    Diabetes Mellitus II Paternal Grandmother    Lupus Paternal Grandmother    - negative except otherwise stated in the family history section Allergies  Allergen Reactions   Bee Venom Anaphylaxis and Swelling   [place] Root (Wolfiporia Coc[place]) Diarrhea and Nausea And Vomiting   Mushroom Diarrhea   Mushroom Extract Complex (Obsolete) Nausea And Vomiting   Prior to Admission medications   Medication Sig Start Date End Date Taking? Authorizing Provider  doxycycline  (VIBRAMYCIN ) 100 MG capsule Take 1 capsule (100 mg total) by mouth 2 (two) times daily. 03/12/24  Yes Patsey Lot, MD  Ibuprofen  200 MG CAPS Take 600 mg by mouth every 6 (six) hours as needed (for pain).   Yes [provider]  acyclovir  (ZOVIRAX ) 400 MG tablet Take 1  tablet (400 mg total) by mouth 4 (four) times daily as needed. Please take if you start to have a cold sore Patient not taking: Reported on 03/16/2024 12/24/22   Lang Norleen POUR, PA-C  insulin  aspart protamine- aspart (NOVOLOG  MIX 70/30) (70-30) 100 UNIT/ML injection Inject 0.75 mLs (75 Units total) into the skin 2 (two) times daily with a meal. Patient not taking: Reported on 03/16/2024 12/20/17   Evonnie Lenis, MD  insulin  aspart protamine- aspart (NOVOLOG  MIX 70/30) (70-30) 100 UNIT/ML injection Inject 0.75 mLs (75 Units total) into the skin 2 (two) times daily with a meal. Patient not taking: Reported on 03/16/2024 04/15/23   Triplett, Tammy, PA-C  metFORMIN   (GLUCOPHAGE ) 500 MG tablet Take 1 tablet (500 mg total) by mouth daily with breakfast. Patient not taking: Reported on 03/16/2024 12/20/17   Evonnie Lenis, MD  metFORMIN  (GLUCOPHAGE ) 500 MG tablet Take 1 tablet (500 mg total) by mouth daily with breakfast. Patient not taking: Reported on 03/16/2024 04/15/23   Herlinda Milling, PA-C   MR TIBIA FIBULA RIGHT WO CONTRAST Result Date: 03/18/2024 CLINICAL DATA:  Right lower leg soft tissue infection with erythema and swelling laterally EXAM: MRI OF LOWER RIGHT EXTREMITY WITHOUT CONTRAST TECHNIQUE: Multiplanar, multisequence MR imaging of the right tibia/fibula was performed. No intravenous contrast was administered. COMPARISON:  Radiographs 03/16/2024 FINDINGS: Despite efforts by the technologist and patient, motion artifact is present on today's exam and could not be eliminated. This reduces exam sensitivity and specificity. Bones/Joint/Cartilage Small knee nonfragmented osteochondral lesions laterally along the patellofemoral articulation. No findings of osteomyelitis. Ligaments Large field of view precludes sensitive assessment. No obvious collateral ligament injury in the knee. Muscles and Tendons No intramuscular abscess or substantial intramuscular edema. Tibialis posterior tenosynovitis posterior to the medial malleolus. Soft tissues Infiltrative subcutaneous edema laterally and posteriorly at the knee joint and continuing anterolaterally and posterolaterally in the mid calf. In the distal calf there is some mild cutaneous irregularity anterolaterally which could be from ulceration or wound. Focal subcutaneous fluid infiltration along the medial distal calf noted on image 64 series 13 potentially with mild cutaneous blistering. At the ankle level, there is relatively circumferential subcutaneous edema although sparing the posterior ankle. No drainable abscess observed. Cellulitis is not excluded. IMPRESSION: 1. Subcutaneous edema in the calf, ankle, and knee, with  some mild cutaneous irregularity anterolaterally in the distal calf which could be from ulceration or wound. No drainable abscess observed. Cellulitis is not excluded. 2. Tibialis posterior tenosynovitis posterior to the medial malleolus. 3. Small nonfragmented osteochondral lesions laterally along the patellofemoral articulation. Small knee effusion. Electronically Signed   By: Ryan Salvage M.D.   On: 03/18/2024 08:38   DG Tibia/Fibula Right Result Date: 03/16/2024 CLINICAL DATA:  right toe lac/cellulitis EXAM: RIGHT TIBIA AND FIBULA - 2 VIEW COMPARISON:  March 12, 2024 FINDINGS: No acute fracture or dislocation. Redemonstrated sequelae of remote trauma along the medial femoral condyle. There is no evidence of arthropathy or other focal bone abnormality. Moderate subcutaneous edema about the calf. No radiopaque gas or radiopaque foreign body. IMPRESSION: Moderate subcutaneous edema about the calf. No subcutaneous gas or radiographic findings of osteomyelitis, at this time. Electronically Signed   By: Rogelia Myers M.D.   On: 03/16/2024 16:49   DG Foot 2 Views Right Result Date: 03/16/2024 CLINICAL DATA:  right toe lac/cellulitis EXAM: RIGHT FOOT - 2 VIEW COMPARISON:  12/17/2017 FINDINGS: Interval amputation of the great toe.No acute fracture or dislocation. Degenerative spurring along the midfoot. Moderate soft tissue swelling.  No subcutaneous gas. IMPRESSION: Moderate soft tissue swelling about the foot. No subcutaneous gas or radiographic findings of osteomyelitis, at this time. Electronically Signed   By: Rogelia Myers M.D.   On: 03/16/2024 16:47   - pertinent xrays, CT, MRI studies were reviewed and independently interpreted  Positive ROS: All other systems have been reviewed and were otherwise negative with the exception of those mentioned in the HPI and as above.  Physical Exam: General: Alert, no acute distress Psychiatric: Patient is competent for consent with normal mood and  affect Lymphatic: No axillary or cervical lymphadenopathy Cardiovascular: No pedal edema Respiratory: No cyanosis, no use of accessory musculature GI: No organomegaly, abdomen is soft and non-tender    Images:  @ENCIMAGES @  Labs:  Lab Results  Component Value Date   HGBA1C 12.3 (H) 03/17/2024   HGBA1C 13.1 (H) 12/17/2017   HGBA1C 12.2 (H) 07/15/2017   ESRSEDRATE 65 (H) 03/17/2024   ESRSEDRATE 17 (H) 01/10/2018   ESRSEDRATE 24 (H) 12/16/2017   CRP 10.2 (H) 03/17/2024   CRP 2.9 (H) 01/10/2018   CRP 18.8 (H) 12/16/2017   REPTSTATUS PENDING 03/16/2024   GRAMSTAIN  12/18/2017    RARE WBC PRESENT, PREDOMINANTLY PMN NO ORGANISMS SEEN Performed at [facility], [address], [place], [place] [address]    CULT  03/16/2024    NO GROWTH 1 DAY Performed at [facility], [address] [place], [place] [address]    [facility] STAPHYLOCOCCUS AUREUS 12/18/2017   LABORGA ENTEROCOCCUS FAECALIS 12/18/2017    Lab Results  Component Value Date   ALBUMIN 3.3 (L) 03/16/2024   ALBUMIN 3.3 (L) 08/31/2023   ALBUMIN 3.6 12/16/2017   PREALBUMIN 16.9 (L) 12/16/2017        Latest Ref Rng & Units 03/18/2024    3:41 AM 03/17/2024   12:52 AM 03/16/2024    4:57 PM  CBC EXTENDED  WBC 4.0 - 10.5 K/uL 12.7  14.9  13.9   RBC 4.22 - 5.81 MIL/uL 4.10  4.56  5.11   Hemoglobin 13.0 - 17.0 g/dL 88.8  88.0  86.4   HCT 39.0 - 52.0 % 35.0  39.4  42.7   Platelets 150 - 400 K/uL 272  283  309   NEUT# 1.7 - 7.7 K/uL   9.9   Lymph# 0.7 - 4.0 K/uL   2.5     Neurologic: Patient does not have protective sensation bilateral lower extremities.   MUSCULOSKELETAL:   Skin: Examination there is painful cellulitis that extends from the dorsum of the foot up to the medial gastrocnemius muscle up to the knee.  He has a strong palpable dorsalis pedis pulse.  He has a necrotic ulcer distally over the medial tibia.  Review of the MRI scan shows cellulitis extending up the calf but no definite abscess.   No osteomyelitis.  No muscle involvement.  Patient's LRINEC score is a 5.  White cell count 12.7 with a hemoglobin of 11.1.  Hemoglobin A1c 12.3 and this has been consistently elevated from the blood work from 2019.  Sed rate 65 C-reactive protein 10.2  Plain radiographs shows no air in the soft tissue no bony abnormalities.  Assessment: Assessment: Large soft tissue infection involving the dorsum of the right foot extending proximal to the medial gastrocnemius muscle with a low risk of necrotizing fasciitis.  Plan: Plan: Will plan for urgent surgical debridement of the infection we will send the fascia and soft tissue for cultures.  Anticipate returning to the  operating room on Friday.  Thank you for the consult and the opportunity to see Mr. Gershon Jerona Sage, MD [facility] [phone] 1:41 PM

## 2024-03-18 NOTE — Plan of Care (Signed)

## 2024-03-18 NOTE — Progress Notes (Signed)
 Pt got transferred to [facility] (short stay) via [facility] on stretcher. Pt alert and oriented *4. Pt and his partner aware about the possible surgery. Piv to left forearm intact infusing LR at 100ml/hr. Dressing to the Right lower extremities clean, dry and intact.

## 2024-03-18 NOTE — Inpatient Diabetes Management (Signed)
 Inpatient Diabetes Program Recommendations  AACE/ADA: New Consensus Statement on Inpatient Glycemic Control (2015)  Target Ranges:  Prepandial:   less than 140 mg/dL      Peak postprandial:   less than 180 mg/dL (1-2 hours)      Critically ill patients:  140 - 180 mg/dL   Lab Results  Component Value Date   GLUCAP 252 (H) 03/18/2024   HGBA1C 12.3 (H) 03/17/2024   Met with  patient at bedside.  Reinforced DM education from yesterday.  TOC has helped with PCP follow up appointment.  Provided pt with a Lilly coupon for Humalog 75/25 which is $35/month.    Discharge Recommendations: Intermediate acting recommendations: Insulin  Lispro Prot & Lispro (HUMALOG 75/25) Kwikpen To be determined at DC    Use Adult Diabetes Insulin  Treatment Post Discharge order set.  Thank you, Wyvonna Pinal, MSN, CDCES Diabetes Coordinator Inpatient Diabetes Program [phone] (team pager from 8a-5p)

## 2024-03-18 NOTE — Anesthesia Procedure Notes (Signed)
 Procedure Name: LMA Insertion Date/Time: 03/18/2024 3:01 PM  Performed by: Virgil Ee, CRNAPre-anesthesia Checklist: Patient identified, Patient being monitored, Timeout performed, Emergency Drugs available and Suction available Patient Re-evaluated:Patient Re-evaluated prior to induction Oxygen  Delivery Method: Circle system utilized Preoxygenation: Pre-oxygenation with 100% oxygen  Induction Type: IV induction Ventilation: Mask ventilation without difficulty LMA: LMA inserted LMA Size: 4.0 Tube type: Oral Number of attempts: 1 Placement Confirmation: positive ETCO2 and breath sounds checked- equal and bilateral Tube secured with: Tape Dental Injury: Teeth and Oropharynx as per pre-operative assessment

## 2024-03-18 NOTE — Transfer of Care (Signed)
 Immediate Anesthesia Transfer of Care Note  Patient: Zachary Schneider  Procedure(s) Performed: IRRIGATION AND DEBRIDEMENT WOUND (Right)  Patient Location: PACU  Anesthesia Type:General  Level of Consciousness: awake  Airway & Oxygen  Therapy: Patient Spontanous Breathing  Post-op Assessment: Report given to RN and Post -op Vital signs reviewed and stable  Post vital signs: Reviewed and stable  Last Vitals:  Vitals Value Taken Time  BP 114/72 03/18/24 15:39  Temp 36.7 C 03/18/24 15:40  Pulse 73 03/18/24 15:42  Resp 12 03/18/24 15:42  SpO2 92 % 03/18/24 15:42  Vitals shown include unfiled device data.  Last Pain:  Vitals:   03/18/24 1249  TempSrc: Oral  PainSc:       Patients Stated Pain Goal: 2 (03/18/24 9147)  Complications: No notable events documented.

## 2024-03-18 NOTE — Op Note (Signed)
 03/18/2024  5:23 PM  PATIENT:  Zachary Schneider    PRE-OPERATIVE DIAGNOSIS:  abscess right leg and right foot  POST-OPERATIVE DIAGNOSIS:  Same  PROCEDURE: Excisional debridement right calf and right foot Soft tissue sent for cultures. Application of Kerecis micro graft 95 cm.  Application 1 g vancomycin  powder and 1.2 g tobramycin powder. Local tissue transfer for wound closure 45 x 4 cm. Application of cleanse choice wound VAC sponge and peel in place wound VAC sponge.  SURGEON:  Jerona LULLA Sage, MD  PHYSICIAN ASSISTANT:None ANESTHESIA:   General  PREOPERATIVE INDICATIONS:  [name] is a  53 y.o. male with a diagnosis of abscess right leg who failed conservative measures and elected for surgical management.    The risks benefits and alternatives were discussed with the patient preoperatively including but not limited to the risks of infection, bleeding, nerve injury, cardiopulmonary complications, the need for revision surgery, among others, and the patient was willing to proceed.  OPERATIVE IMPLANTS:   Implant Name Type Inv. Item Serial No. Manufacturer Lot No. LRB No. Used Action  GRAFT SKIN WND SURGICLOSE M95 - [id number] Tissue GRAFT SKIN WND SURGICLOSE M95  [employer] [id number] Right 1 Implanted    @ENCIMAGES @  OPERATIVE FINDINGS: Patient had a large abscess anteriorly over the medial calf.  There was thickening and necrotic fasciitis that was excised and sent for cultures.  OPERATIVE PROCEDURE: Patient was brought the operating room and underwent general anesthetic.  After adequate levels anesthesia obtained patient's right lower extremity was prepped using DuraPrep draped into a sterile field a timeout was called.  The area of cellulitis extended up to the tibial plateau medially.  The incision was started at the tibial plateau medially extended down through the ulcerative tissue.  The ulcerative tissue was excised and a block of tissue this was purulent with  necrotic tissue this was sent for cultures.  The incision was carried down across the dorsum of the ankle to the area of cellulitis on the dorsum of the foot.  Blunt dissection was carried down to the fascia and fascia was excised over the medial calf and dorsum of the foot.  The compartment was released from the posterior compartment.  There was noted deep abscess.  The muscle had good color and contractility.  Electrocautery was used for hemostasis.  The wound was irrigated with Vashe.  95 cm of Kerecis micro graft was mixed with 1 g vancomycin  powder and 1.2 g of tobramycin this was placed within the wound bed.  Local tissue transfer was used to close the wound 45 x 4 cm.  The tissue defect distally was filled with a cleanse choice wound VAC sponge in a peel in place sponge was placed superficial.  This had a good suction fit patient was extubated taken the PACU in stable condition.   DISCHARGE PLANNING:  Antibiotic duration: Continue antibiotics adjust according to culture sensitivities  Weightbearing: Weightbearing as tolerated on the right  Pain medication: Opioid pathway  Dressing care/ Wound VAC: Continue wound VAC  Ambulatory devices: Walker or crutches  Discharge to: Anticipate return to the operating room on Friday  Follow-up: In the office 1 week post operative.

## 2024-03-18 NOTE — Progress Notes (Signed)
 PROGRESS NOTE    Zachary Schneider  FMW:[id number] DOB: 10-24-1970 DOA: 03/16/2024 PCP: Pcp, No   Brief Narrative:  Patient is a 53 y.o. male with medical history significant for testicular cancer and poorly controlled type 2 diabetes mellitus who presents with worsening redness, swelling, and pain involving the lower right leg.  Recently seen on 9/11 for calf tenderness swelling and erythema initiated on doxycycline  with worsening symptoms concerning for failure of outpatient therapy.  Hospitalist called for admission, orthopedic surgery called in consult.   Assessment & Plan:   Principal Problem:   Cellulitis of right lower extremity Active Problems:   Uncontrolled type 2 diabetes mellitus with hyperglycemia (HCC)  RLE disseminated cellulitis, rule out abscess Complicated by uncontrolled diabetes mellitus - Patient does not meet sepsis criteria - Orthopedics consulted recommending transfer to [facility] for possible I&D.   - CRP elevated at 10.2, ESR elevated to 65, lactic within normal limits - Remains n.p.o. pending surgery   Type II DM, uncontrolled with hyperglycemia - A1c 12.3 - Continue to titrate insulin , currently n.p.o., hold long-acting insulin  at this time   History of noncompliance   -Noncompliant with insulin  and metformin   - Discussed need for compliance with medications as well as lifestyle and wound care  DVT prophylaxis: heparin  injection 5,000 Units Start: 03/18/24 0830 Code Status:   Code Status: Full Code Family Communication: None present   Status is: Inpatient   Dispo: The patient is from: Home              Anticipated d/c is to: To be determined              Anticipated d/c date is: 48 to 72 hours              Patient currently not medically stable for discharge  Consultants:  Orthopedic surgery  Procedures:  Pending orthopedic evaluation possible I&D  Antimicrobials:  Cefepime , vancomycin   Subjective: No acute issues or events overnight denies  nausea vomiting diarrhea constipation headache fevers chills or chest pain.  Right lower extremity wound pain well-controlled denies paresthesias  Objective: Vitals:   03/17/24 1351 03/17/24 1642 03/17/24 2224 03/18/24 0650  BP: 123/72 127/73 126/74 132/69  Pulse: 63 (!) 59 64 66  Resp: 17 16 18 16   Temp: 98.1 F (36.7 C) 98.7 F (37.1 C) 98.9 F (37.2 C) 98.5 F (36.9 C)  TempSrc:   Oral Oral  SpO2: 96% 97% 94% 94%  Weight:      Height:        Intake/Output Summary (Last 24 hours) at 03/18/2024 0730 Last data filed at 03/18/2024 0600 Gross per 24 hour  Intake 3676.05 ml  Output 0 ml  Net 3676.05 ml   Filed Weights   03/16/24 1524  Weight: 113.4 kg    Examination:  General:  Pleasantly resting in bed, No acute distress. HEENT:  Normocephalic atraumatic.  Sclerae nonicteric, noninjected.  Extraocular movements intact bilaterally. Neck:  Without mass or deformity.  Trachea is midline. Lungs:  Clear to auscultate bilaterally without rhonchi, wheeze, or rales. Heart:  Regular rate and rhythm.  Without murmurs, rubs, or gallops. Abdomen:  Soft, nontender, nondistended.  Without guarding or rebound. Extremities: Right lower extremity blanching cellulitis-noted 1.5 cm circular open lesion    Data Reviewed: I have personally reviewed following labs and imaging studies  CBC: Recent Labs  Lab 03/12/24 0907 03/16/24 1657 03/17/24 0052 03/18/24 0341  WBC 15.5* 13.9* 14.9* 12.7*  NEUTROABS 13.1* 9.9*  --   --  HGB 14.4 13.5 11.9* 11.1*  HCT 45.1 42.7 39.4 35.0*  MCV 83.4 83.6 86.4 85.4  PLT 232 309 283 272   Basic Metabolic Panel: Recent Labs  Lab 03/12/24 1000 03/16/24 1657 03/17/24 0052 03/18/24 0341  NA 132* 134* 130* 136  K 3.7 3.9 3.7 3.6  CL 96* 96* 97* 101  CO2 23 25 24 24   GLUCOSE 320* 407* 424* 268*  BUN 8 9 8 6   CREATININE 0.66 0.61 0.69 0.53*  CALCIUM  8.9 9.2 8.2* 8.2*   GFR: Estimated Creatinine Clearance: 140.9 mL/min (A) (by C-G formula  based on SCr of 0.53 mg/dL (L)). Liver Function Tests: Recent Labs  Lab 03/16/24 1657  AST 13*  ALT 12  ALKPHOS 91  BILITOT 0.6  PROT 7.0  ALBUMIN 3.3*   No results for input(s): LIPASE, AMYLASE in the last 168 hours. No results for input(s): AMMONIA in the last 168 hours. Coagulation Profile: Recent Labs  Lab 03/16/24 1657  INR 1.2   HbA1C: Recent Labs    03/17/24 0052  HGBA1C 12.3*   CBG: Recent Labs  Lab 03/17/24 0732 03/17/24 1121 03/17/24 1640 03/17/24 2143 03/18/24 0713  GLUCAP 316* 256* 278* 289* 237*   Sepsis Labs: Recent Labs  Lab 03/16/24 1747 03/16/24 1908 03/16/24 2218 03/17/24 0052  LATICACIDVEN 3.4* 3.1* 2.9* 1.8    Recent Results (from the past 240 hours)  Blood Culture (routine x 2)     Status: None (Preliminary result)   Collection Time: 03/16/24  4:57 PM   Specimen: BLOOD RIGHT ARM  Result Value Ref Range Status   Specimen Description   Final    BLOOD RIGHT ARM Performed at [facility], [address], [place], [place] [address]    Special Requests   Final    BOTTLES DRAWN AEROBIC AND ANAEROBIC Blood Culture adequate volume Performed at [facility], [address] [place], [place] [address]    Culture   Final    NO GROWTH < 12 HOURS Performed at [facility], [address] [place], [place] [address]    Report Status PENDING  Incomplete  Blood Culture (routine x 2)     Status: None (Preliminary result)   Collection Time: 03/16/24 10:18 PM   Specimen: BLOOD  Result Value Ref Range Status   Specimen Description   Final    BLOOD RIGHT ANTECUBITAL Performed at [facility], [address], [place], [place] [address]    Special Requests   Final    Blood Culture results may not be optimal due to an inadequate volume of blood received in culture bottles BOTTLES DRAWN AEROBIC AND ANAEROBIC Performed at [facility], [address], [place], [place]  [address]    Culture   Final    NO GROWTH < 12 HOURS Performed at [facility], [address] [place], [place] [address]    Report Status PENDING  Incomplete         Radiology Studies: DG Tibia/Fibula Right Result Date: 03/16/2024 CLINICAL DATA:  right toe lac/cellulitis EXAM: RIGHT TIBIA AND FIBULA - 2 VIEW COMPARISON:  March 12, 2024 FINDINGS: No acute fracture or dislocation. Redemonstrated sequelae of remote trauma along the medial femoral condyle. There is no evidence of arthropathy or other focal bone abnormality. Moderate subcutaneous edema about the calf. No radiopaque gas or radiopaque foreign body. IMPRESSION: Moderate subcutaneous edema about the calf. No subcutaneous gas or radiographic findings of osteomyelitis, at this  time. Electronically Signed   By: Rogelia Myers M.D.   On: 03/16/2024 16:49   DG Foot 2 Views Right Result Date: 03/16/2024 CLINICAL DATA:  right toe lac/cellulitis EXAM: RIGHT FOOT - 2 VIEW COMPARISON:  12/17/2017 FINDINGS: Interval amputation of the great toe.No acute fracture or dislocation. Degenerative spurring along the midfoot. Moderate soft tissue swelling. No subcutaneous gas. IMPRESSION: Moderate soft tissue swelling about the foot. No subcutaneous gas or radiographic findings of osteomyelitis, at this time. Electronically Signed   By: Rogelia Myers M.D.   On: 03/16/2024 16:47        Scheduled Meds:  heparin  injection (subcutaneous)  5,000 Units Subcutaneous Q8H   influenza vac split trivalent PF  0.5 mL Intramuscular Tomorrow-1000   insulin  aspart  0-5 Units Subcutaneous QHS   insulin  aspart  0-6 Units Subcutaneous TID WC   insulin  glargine  30 Units Subcutaneous Daily   sodium chloride  flush  3 mL Intravenous Q12H   Continuous Infusions:  ceFEPime  (MAXIPIME ) IV 2 g (03/17/24 2334)   lactated ringers  100 mL/hr at 03/17/24 2147   vancomycin  1,500 mg (03/18/24 0602)     LOS: 2 days   Time spent:  Elsie JAYSON Montclair,  DO [facility]  If 7PM-7AM, please contact night-coverage [email or web address]  03/18/2024, 7:30 AM

## 2024-03-18 NOTE — TOC Progression Note (Signed)
 Transition of Care [facility]) - Progression Note    Patient Details  Name: Zachary Schneider MRN: [id number] Date of Birth: 06-14-1971  [facility] CM/SW Contact  Alfonse JONELLE Rex, RN Phone Number: 03/18/2024, 12:52 PM  Clinical Narrative:   patient transferred to [facility]                       Expected Discharge Plan and Services                                               Social Drivers of Health (SDOH) Interventions SDOH Screenings   Food Insecurity: No Food Insecurity (03/16/2024)  Housing: Low Risk  (03/16/2024)  Transportation Needs: No Transportation Needs (03/16/2024)  Utilities: Not At Risk (03/16/2024)  Social Connections: Moderately Isolated (03/16/2024)  Tobacco Use: High Risk (03/18/2024)    Readmission Risk Interventions     No data to display

## 2024-03-19 ENCOUNTER — Encounter (HOSPITAL_COMMUNITY): Payer: Self-pay | Admitting: Orthopedic Surgery

## 2024-03-19 ENCOUNTER — Encounter (HOSPITAL_COMMUNITY): Payer: Self-pay

## 2024-03-19 LAB — BASIC METABOLIC PANEL WITH GFR
Anion gap: 11 (ref 5–15)
BUN: 7 mg/dL (ref 6–20)
CO2: 25 mmol/L (ref 22–32)
Calcium: 7.6 mg/dL — ABNORMAL LOW (ref 8.9–10.3)
Chloride: 96 mmol/L — ABNORMAL LOW (ref 98–111)
Creatinine, Ser: 0.64 mg/dL (ref 0.61–1.24)
GFR, Estimated: >60 mL/min (ref >60)
Glucose, Bld: 255 mg/dL — ABNORMAL HIGH (ref 70–99)
Potassium: 3.3 mmol/L — ABNORMAL LOW (ref 3.5–5.1)
Sodium: 132 mmol/L — ABNORMAL LOW (ref 135–145)

## 2024-03-19 LAB — VITAMIN B12: Vitamin B-12: 304 pg/mL (ref 180–914)

## 2024-03-19 LAB — CBC
HCT: 30.2 % — ABNORMAL LOW (ref 39.0–52.0)
Hemoglobin: 9.9 g/dL — ABNORMAL LOW (ref 13.0–17.0)
MCH: 27.5 pg (ref 26.0–34.0)
MCHC: 32.8 g/dL (ref 30.0–36.0)
MCV: 83.9 fL (ref 80.0–100.0)
Platelets: 289 K/uL (ref 150–400)
RBC: 3.6 MIL/uL — ABNORMAL LOW (ref 4.22–5.81)
RDW: 14.5 % (ref 11.5–15.5)
WBC: 12.9 K/uL — ABNORMAL HIGH (ref 4.0–10.5)
nRBC: 0 % (ref 0.0–0.2)

## 2024-03-19 LAB — GLUCOSE, CAPILLARY
Glucose-Capillary: 297 mg/dL — ABNORMAL HIGH (ref 70–99)
Glucose-Capillary: 261 mg/dL — ABNORMAL HIGH (ref 70–99)
Glucose-Capillary: 218 mg/dL — ABNORMAL HIGH (ref 70–99)
Glucose-Capillary: 190 mg/dL — ABNORMAL HIGH (ref 70–99)

## 2024-03-19 LAB — RETICULOCYTES
Immature Retic Fract: 29.3 % — ABNORMAL HIGH (ref 2.3–15.9)
RBC.: 3.73 MIL/uL — ABNORMAL LOW (ref 4.22–5.81)
Retic Count, Absolute: 99.2 K/uL (ref 19.0–186.0)
Retic Ct Pct: 2.7 % (ref 0.4–3.1)

## 2024-03-19 LAB — FERRITIN: Ferritin: 261 ng/mL (ref 24–336)

## 2024-03-19 LAB — IRON AND TIBC
Iron: 16 ug/dL — ABNORMAL LOW (ref 45–182)
Saturation Ratios: 9 % — ABNORMAL LOW (ref 17.9–39.5)
TIBC: 183 ug/dL — ABNORMAL LOW (ref 250–450)
UIBC: 167 ug/dL

## 2024-03-19 LAB — FOLATE: Folate: 11.2 ng/mL (ref >5.9)

## 2024-03-19 MED ORDER — ACETAMINOPHEN 500 MG PO TABS
1000.0000 mg | ORAL_TABLET | Freq: Once | ORAL | Status: AC
Start: 2024-03-19 — End: 2024-03-19
  Administered 2024-03-19: 1000 mg via ORAL
  Filled 2024-03-19: qty 2

## 2024-03-19 MED ORDER — POTASSIUM CHLORIDE CRYS ER 20 MEQ PO TBCR
40.0000 meq | EXTENDED_RELEASE_TABLET | ORAL | Status: AC
  Administered 2024-03-19 (×2): 40 meq via ORAL
  Filled 2024-03-19 (×2): qty 2

## 2024-03-19 MED ORDER — INSULIN GLARGINE 100 UNIT/ML ~~LOC~~ SOLN
10.0000 [IU] | Freq: Once | SUBCUTANEOUS | Status: AC
Start: 2024-03-19 — End: 2024-03-19
  Administered 2024-03-19: 10 [IU] via SUBCUTANEOUS
  Filled 2024-03-19: qty 0.1

## 2024-03-19 MED ORDER — INSULIN GLARGINE 100 UNIT/ML ~~LOC~~ SOLN
40.0000 [IU] | Freq: Every day | SUBCUTANEOUS | Status: DC
Start: 2024-03-20 — End: 2024-03-21
  Administered 2024-03-20: 40 [IU] via SUBCUTANEOUS
  Filled 2024-03-19 (×2): qty 0.4

## 2024-03-19 MED ORDER — CHLORHEXIDINE GLUCONATE 4 % EX SOLN
60.0000 mL | Freq: Once | CUTANEOUS | Status: DC
  Filled 2024-03-19 (×2): qty 60

## 2024-03-19 NOTE — Plan of Care (Signed)

## 2024-03-19 NOTE — TOC Progression Note (Addendum)
 Transition of Care [facility]) - Progression Note    Patient Details  Name: Zachary Schneider MRN: [id number] Date of Birth: Jun 18, 1971  Transition of Care [facility]) CM/SW Contact  Lauraine FORBES Saa, LCSWA Phone Number: 03/19/2024, 9:01 AM  Clinical Narrative:     9:02 AM Per chart review, patient resides at home with significant other. Prior RNCM made PCP appointment for patient [facility], Tuesday, March 24, 2024 at 11:00am, added to AVS). Patient does not have insurance. CSW consulted financial counseling for further assistance. Financial counseling informed TOC that patient was recently screened for Medicaid and is over income to qualify. Patient does not have SNF/HH/DME history. Patient's preferred pharmacy is [facility] [address] TOC will continue to follow and be available to assist.    Expected Discharge Plan: Home/Self Care Barriers to Discharge: Continued Medical Work up               Expected Discharge Plan and Services       Living arrangements for the past 2 months: Single Family Home                                       Social Drivers of Health (SDOH) Interventions SDOH Screenings   Food Insecurity: No Food Insecurity (03/16/2024)  Housing: Low Risk  (03/16/2024)  Transportation Needs: No Transportation Needs (03/16/2024)  Utilities: Not At Risk (03/16/2024)  Social Connections: Moderately Isolated (03/16/2024)  Tobacco Use: High Risk (03/18/2024)    Readmission Risk Interventions     No data to display

## 2024-03-19 NOTE — H&P (View-Only) (Signed)
 Patient ID: Zachary Schneider, male   DOB: [birth year], 54 y.o.   MRN: [id number] Patient is postoperative day 1 debridement abscess that involve the dorsum of the right foot and medial aspect of the right leg.  Cultures are showing gram-positive cocci.  Wound VAC has 350 cc.  Plan for return to the operating room tomorrow for repeat debridement

## 2024-03-19 NOTE — Anesthesia Postprocedure Evaluation (Signed)
 Anesthesia Post Note  Patient: Zachary Schneider  Procedure(s) Performed: IRRIGATION AND DEBRIDEMENT WOUND (Right)     Patient location during evaluation: PACU Anesthesia Type: General Level of consciousness: awake and alert Pain management: pain level controlled Vital Signs Assessment: post-procedure vital signs reviewed and stable Respiratory status: spontaneous breathing, nonlabored ventilation and respiratory function stable Cardiovascular status: blood pressure returned to baseline and stable Postop Assessment: no apparent nausea or vomiting Anesthetic complications: no   No notable events documented.                  Laqueshia Cihlar

## 2024-03-19 NOTE — Progress Notes (Signed)
 PROGRESS NOTE    Zachary Schneider  FMW:[id number] DOB: January 05, 1971 DOA: 03/16/2024 PCP: Pcp, No   Brief Narrative:  Patient is a 53 y.o. male with medical history significant for testicular cancer and poorly controlled type 2 diabetes mellitus who presents with worsening redness, swelling, and pain involving the lower right leg.  Recently seen on 9/11 for calf tenderness swelling and erythema initiated on doxycycline  with worsening symptoms concerning for failure of outpatient therapy.  Initially admitted at [facility], transferred to [facility] per orthopedics recommendation for surgical debridement.  Assessment & Plan:   Principal Problem:   Cellulitis of right lower extremity Active Problems:   Uncontrolled type 2 diabetes mellitus with hyperglycemia (HCC)   Abscess of left lower leg  RLE disseminated cellulitis/Tibialis posterior tenosynovitis  - Patient does not meet sepsis criteria.  Started on cefazolin , cellulitis did not improve, transferred to [facility] and underwent  Excisional debridement right calf and right foot, Soft tissue sent for cultures which is growing gram-positive cocci.  Also wound VAC attached.  Per orthopedics, plan for next stage of surgical debridement tomorrow.  Patient remains on cefepime .   Type II DM, uncontrolled with hyperglycemia/history of noncompliance to medications: - A1c 12.3, noncompliant with insulin  and metformin .  Currently on 30 units of Lantus  but is still hyperglycemic with blood sugar in 200 range, received 30 units this morning already, will add 10 units and change order to 40 units starting tomorrow.  Continue SSI.  Acute anemia: Hemoglobin was within normal range few days ago, now trending down and 9.9 today.  No history of hematemesis, hematochezia or melena.  Will check iron studies, B12, folate and FOBT.  Monitor closely.  Transfuse if less than 7.  Hypokalemia: Will replenish.   History of noncompliance   -Noncompliant with insulin   and metformin   - Discussed need for compliance with medications as well as lifestyle and wound care  Class II morbid obesity: BMI almost 35.  Weight loss and diet modification counseled.  DVT prophylaxis: heparin  injection 5,000 Units Start: 03/18/24 0830   Code Status: Full Code  Family Communication:  None present at bedside.  Plan of care discussed with patient in length and he/she verbalized understanding and agreed with it.  Status is: Inpatient Remains inpatient appropriate because: Scheduled for another debridement tomorrow.   Estimated body mass index is 34.87 kg/m as calculated from the following:   Height as of this encounter: 6' 1 (1.854 m).   Weight as of this encounter: 119.9 kg.    Nutritional Assessment: Body mass index is 34.87 kg/m.SABRA Seen by dietician.  I agree with the assessment and plan as outlined below: Nutrition Status:        . Skin Assessment: I have examined the patient's skin and I agree with the wound assessment as performed by the wound care RN as outlined below:    Consultants:  Orthopedics  Procedures:  As above  Antimicrobials:  Anti-infectives (From admission, onward)    Start     Dose/Rate Route Frequency Ordered Stop   03/19/24 0600  ceFAZolin  (ANCEF ) IVPB 2g/100 mL premix        2 g 200 mL/hr over 30 Minutes Intravenous On call to O.R. 03/18/24 1341 03/18/24 1503   03/17/24 0600  vancomycin  (VANCOREADY) IVPB 1500 mg/300 mL        1,500 mg 150 mL/hr over 120 Minutes Intravenous Every 12 hours 03/16/24 1932     03/17/24 0000  ceFEPIme  (MAXIPIME ) 2 g in sodium  chloride 0.9 % 100 mL IVPB        2 g 200 mL/hr over 30 Minutes Intravenous Every 8 hours 03/16/24 1929     03/16/24 1545  vancomycin  (VANCOCIN ) IVPB 1000 mg/200 mL premix  Status:  Discontinued        1,000 mg 200 mL/hr over 60 Minutes Intravenous  Once 03/16/24 1541 03/16/24 1542   03/16/24 1545  ceFEPIme  (MAXIPIME ) 2 g in sodium chloride  0.9 % 100 mL IVPB        2  g 200 mL/hr over 30 Minutes Intravenous  Once 03/16/24 1541 03/16/24 1723   03/16/24 1545  vancomycin  (VANCOREADY) IVPB 2000 mg/400 mL        2,000 mg 200 mL/hr over 120 Minutes Intravenous  Once 03/16/24 1542 03/16/24 1934         Subjective: Seen and examined, he has no complaints.  Pain is controlled.\  Objective: Vitals:   03/18/24 1732 03/18/24 2004 03/19/24 0420 03/19/24 0754  BP:  112/63 106/67 115/76  Pulse:  70 68 72  Resp:  16 16 18   Temp:  98.1 F (36.7 C) 99.4 F (37.4 C) 98.6 F (37 C)  TempSrc:  Oral Oral Oral  SpO2:  96% 96% 92%  Weight: 119.9 kg     Height: 6' 1 (1.854 m)       Intake/Output Summary (Last 24 hours) at 03/19/2024 0834 Last data filed at 03/19/2024 0755 Gross per 24 hour  Intake 500 ml  Output 1320 ml  Net -820 ml   Filed Weights   03/16/24 1524 03/18/24 1249 03/18/24 1732  Weight: 113.4 kg 113.4 kg 119.9 kg    Examination:  General exam: Appears calm and comfortable  Respiratory system: Clear to auscultation. Respiratory effort normal. Cardiovascular system: S1 & S2 heard, RRR. No JVD, murmurs, rubs, gallops or clicks. No pedal edema. Gastrointestinal system: Abdomen is nondistended, soft and nontender. No organomegaly or masses felt. Normal bowel sounds heard. Central nervous system: Alert and oriented. No focal neurological deficits. Extremities: Wound VAC attached to the right leg. Psychiatry: Judgement and insight appear normal. Mood & affect appropriate.    Data Reviewed: I have personally reviewed following labs and imaging studies  CBC: Recent Labs  Lab 03/12/24 0907 03/16/24 1657 03/17/24 0052 03/18/24 0341 03/19/24 0455  WBC 15.5* 13.9* 14.9* 12.7* 12.9*  NEUTROABS 13.1* 9.9*  --   --   --   HGB 14.4 13.5 11.9* 11.1* 9.9*  HCT 45.1 42.7 39.4 35.0* 30.2*  MCV 83.4 83.6 86.4 85.4 83.9  PLT 232 309 283 272 289   Basic Metabolic Panel: Recent Labs  Lab 03/12/24 1000 03/16/24 1657 03/17/24 0052 03/18/24 0341  03/19/24 0455  NA 132* 134* 130* 136 132*  K 3.7 3.9 3.7 3.6 3.3*  CL 96* 96* 97* 101 96*  CO2 23 25 24 24 25   GLUCOSE 320* 407* 424* 268* 255*  BUN 8 9 8 6 7   CREATININE 0.66 0.61 0.69 0.53* 0.64  CALCIUM  8.9 9.2 8.2* 8.2* 7.6*   GFR: Estimated Creatinine Clearance: 144.8 mL/min (by C-G formula based on SCr of 0.64 mg/dL). Liver Function Tests: Recent Labs  Lab 03/16/24 1657  AST 13*  ALT 12  ALKPHOS 91  BILITOT 0.6  PROT 7.0  ALBUMIN 3.3*   No results for input(s): LIPASE, AMYLASE in the last 168 hours. No results for input(s): AMMONIA in the last 168 hours. Coagulation Profile: Recent Labs  Lab 03/16/24 1657  INR 1.2   Cardiac  Enzymes: No results for input(s): CKTOTAL, CKMB, CKMBINDEX, TROPONINI in the last 168 hours. BNP (last 3 results) No results for input(s): PROBNP in the last 8760 hours. HbA1C: Recent Labs    03/17/24 0052  HGBA1C 12.3*   CBG: Recent Labs  Lab 03/18/24 1546 03/18/24 1734 03/18/24 2047 03/18/24 2204 03/19/24 0757  GLUCAP 205* 148* 230* 236* 297*   Lipid Profile: No results for input(s): CHOL, HDL, LDLCALC, TRIG, CHOLHDL, LDLDIRECT in the last 72 hours. Thyroid Function Tests: No results for input(s): TSH, T4TOTAL, FREET4, T3FREE, THYROIDAB in the last 72 hours. Anemia Panel: No results for input(s): VITAMINB12, FOLATE, FERRITIN, TIBC, IRON, RETICCTPCT in the last 72 hours. Sepsis Labs: Recent Labs  Lab 03/16/24 1747 03/16/24 1908 03/16/24 2218 03/17/24 0052  LATICACIDVEN 3.4* 3.1* 2.9* 1.8    Recent Results (from the past 240 hours)  Blood Culture (routine x 2)     Status: None (Preliminary result)   Collection Time: 03/16/24  4:57 PM   Specimen: BLOOD RIGHT ARM  Result Value Ref Range Status   Specimen Description   Final    BLOOD RIGHT ARM Performed at [facility], [address], [place], [place] [address]    Special Requests   Final    BOTTLES DRAWN  AEROBIC AND ANAEROBIC Blood Culture adequate volume Performed at [facility], [address] [place], [place] [address]    Culture   Final    NO GROWTH 2 DAYS Performed at [facility], [address], [place], [place] [address]    Report Status PENDING  Incomplete  Blood Culture (routine x 2)     Status: None (Preliminary result)   Collection Time: 03/16/24 10:18 PM   Specimen: BLOOD  Result Value Ref Range Status   Specimen Description   Final    BLOOD RIGHT ANTECUBITAL Performed at [facility], [address] [place], [place] [address]    Special Requests   Final    Blood Culture results may not be optimal due to an inadequate volume of blood received in culture bottles BOTTLES DRAWN AEROBIC AND ANAEROBIC Performed at [facility], [address] [place], [place] [address]    Culture   Final    NO GROWTH 1 DAY Performed at [facility], [address], [place], [place] [address]    Report Status PENDING  Incomplete  Aerobic/Anaerobic Culture w Gram Stain (surgical/deep wound)     Status: None (Preliminary result)   Collection Time: 03/18/24  3:08 PM   Specimen: Leg, Right; Tissue  Result Value Ref Range Status   Specimen Description TISSUE RIGHT LEG  Final   Special Requests NONE  Final   Gram Stain   Final    FEW WBC PRESENT, PREDOMINANTLY PMN RARE GRAM POSITIVE COCCI IN PAIRS    Culture   Final    CULTURE REINCUBATED FOR BETTER GROWTH Performed at [facility], [address] [place], [place] [address]    Report Status PENDING  Incomplete     Radiology Studies: MR TIBIA FIBULA RIGHT WO CONTRAST Result Date: 03/18/2024 CLINICAL DATA:  Right lower leg soft tissue infection with erythema and swelling laterally EXAM: MRI OF LOWER RIGHT EXTREMITY WITHOUT CONTRAST TECHNIQUE: Multiplanar, multisequence MR imaging of the right tibia/fibula was performed. No intravenous contrast was administered.  COMPARISON:  Radiographs 03/16/2024 FINDINGS: Despite efforts by the technologist and patient, motion artifact is present on today's exam and could not be  eliminated. This reduces exam sensitivity and specificity. Bones/Joint/Cartilage Small knee nonfragmented osteochondral lesions laterally along the patellofemoral articulation. No findings of osteomyelitis. Ligaments Large field of view precludes sensitive assessment. No obvious collateral ligament injury in the knee. Muscles and Tendons No intramuscular abscess or substantial intramuscular edema. Tibialis posterior tenosynovitis posterior to the medial malleolus. Soft tissues Infiltrative subcutaneous edema laterally and posteriorly at the knee joint and continuing anterolaterally and posterolaterally in the mid calf. In the distal calf there is some mild cutaneous irregularity anterolaterally which could be from ulceration or wound. Focal subcutaneous fluid infiltration along the medial distal calf noted on image 64 series 13 potentially with mild cutaneous blistering. At the ankle level, there is relatively circumferential subcutaneous edema although sparing the posterior ankle. No drainable abscess observed. Cellulitis is not excluded. IMPRESSION: 1. Subcutaneous edema in the calf, ankle, and knee, with some mild cutaneous irregularity anterolaterally in the distal calf which could be from ulceration or wound. No drainable abscess observed. Cellulitis is not excluded. 2. Tibialis posterior tenosynovitis posterior to the medial malleolus. 3. Small nonfragmented osteochondral lesions laterally along the patellofemoral articulation. Small knee effusion. Electronically Signed   By: Ryan Salvage M.D.   On: 03/18/2024 08:38    Scheduled Meds:  heparin  injection (subcutaneous)  5,000 Units Subcutaneous Q8H   influenza vac split trivalent PF  0.5 mL Intramuscular Tomorrow-1000   insulin  aspart  0-5 Units Subcutaneous QHS   insulin  aspart  0-6 Units  Subcutaneous TID WC   [START ON 03/20/2024] insulin  glargine  40 Units Subcutaneous Daily   insulin  glargine  10 Units Subcutaneous Once   potassium chloride   40 mEq Oral Q4H   sodium chloride  flush  3 mL Intravenous Q12H   Continuous Infusions:  ceFEPime  (MAXIPIME ) IV 2 g (03/19/24 0832)   lactated ringers  100 mL/hr at 03/18/24 1452   vancomycin  1,500 mg (03/19/24 0621)     LOS: 3 days   Fredia Skeeter, MD [facility]  03/19/2024, 8:34 AM   *Please note that this is a verbal dictation therefore any spelling or grammatical errors are due to the Dragon Medical One system interpretation.  Please page via Amion and do not message via secure chat for urgent patient care matters. Secure chat can be used for non urgent patient care matters.  How to contact the [facility] Attending or Consulting provider 7A - 7P or covering provider during after hours 7P -7A, for this patient?  Check the care team in [facility] and look for a) attending/consulting [facility] provider listed and b) the [facility] team listed. Page or secure chat 7A-7P. Log into [email or web address] and use [place]'s universal password to access. If you do not have the password, please contact the hospital operator. Locate the [facility] provider you are looking for under [facility] and page to a number that you can be directly reached. If you still have difficulty reaching the provider, please page the [facility] (Director on Call) for the Hospitalists listed on amion for assistance.

## 2024-03-19 NOTE — Anesthesia Preprocedure Evaluation (Signed)
 Anesthesia Evaluation  Patient identified by MRN, date of birth, ID band Patient awake    Reviewed: Allergy & Precautions, NPO status , Patient's Chart, lab work & pertinent test results  History of Anesthesia Complications Negative for: history of anesthetic complications  Airway Mallampati: III  TM Distance: >3 FB Neck ROM: Full    Dental  (+) Dental Advisory Given   Pulmonary neg shortness of breath, neg sleep apnea, neg COPD, neg recent URI, Current Smoker and Patient abstained from smoking.   Pulmonary exam normal breath sounds clear to auscultation       Cardiovascular negative cardio ROS  Rhythm:Regular Rate:Normal     Neuro/Psych negative neurological ROS     GI/Hepatic Neg liver ROS,neg GERD  ,,H/o SBO   Endo/Other  diabetes, Type 2, Insulin  Dependent, Oral Hypoglycemic Agents    Renal/GU negative Renal ROS   H/o testicular cancer    Musculoskeletal   Abdominal  (+) + obese  Peds  Hematology  (+) Blood dyscrasia, anemia Lab Results      Component                Value               Date                      WBC                      12.9 (H)            03/19/2024                HGB                      9.9 (L)             03/19/2024                HCT                      30.2 (L)            03/19/2024                MCV                      83.9                03/19/2024                PLT                      289                 03/19/2024              Anesthesia Other Findings   Reproductive/Obstetrics                              Anesthesia Physical Anesthesia Plan  ASA: 3  Anesthesia Plan: General   Post-op Pain Management: Tylenol  PO (pre-op)*   Induction: Intravenous  PONV Risk Score and Plan: 1 and Ondansetron , Dexamethasone, Midazolam  and Treatment may vary due to age or medical condition  Airway Management Planned: LMA  Additional Equipment:   Intra-op  Plan:   Post-operative Plan: Extubation in OR  Informed Consent: I have reviewed the  patients History and Physical, chart, labs and discussed the procedure including the risks, benefits and alternatives for the proposed anesthesia with the patient or authorized representative who has indicated his/her understanding and acceptance.     Dental advisory given  Plan Discussed with: CRNA and Anesthesiologist  Anesthesia Plan Comments: (Risks of general anesthesia discussed including, but not limited to, sore throat, hoarse voice, chipped/damaged teeth, injury to vocal cords, nausea and vomiting, allergic reactions, lung infection, heart attack, stroke, and death. All questions answered. )         Anesthesia Quick Evaluation

## 2024-03-19 NOTE — Anesthesia Preprocedure Evaluation (Signed)
 Anesthesia Evaluation  Patient identified by MRN, date of birth, ID band Patient awake    Reviewed: Allergy & Precautions, NPO status , Patient's Chart, lab work & pertinent test results  History of Anesthesia Complications Negative for: history of anesthetic complications  Airway Mallampati: II  TM Distance: >3 FB Neck ROM: Full    Dental  (+) Dental Advisory Given   Pulmonary neg shortness of breath, neg sleep apnea, neg COPD, neg recent URI, Current Smoker and Patient abstained from smoking., former smoker Ex smoker x 2 weeks    breath sounds clear to auscultation       Cardiovascular negative cardio ROS  Rhythm:Regular     Neuro/Psych negative neurological ROS  negative psych ROS   GI/Hepatic negative GI ROS, Neg liver ROS,,,  Endo/Other  diabetes, Poorly Controlled, Type 1, Insulin  Dependent    Renal/GU negative Renal ROS     Musculoskeletal negative musculoskeletal ROS (+)    Abdominal   Peds  Hematology negative hematology ROS (+)   Anesthesia Other Findings   Reproductive/Obstetrics                              Anesthesia Physical Anesthesia Plan  ASA: 3  Anesthesia Plan: General   Post-op Pain Management: Ofirmev  IV (intra-op)*   Induction: Intravenous  PONV Risk Score and Plan: 3 and Ondansetron   Airway Management Planned: LMA  Additional Equipment: None  Intra-op Plan:   Post-operative Plan: Extubation in OR  Informed Consent: I have reviewed the patients History and Physical, chart, labs and discussed the procedure including the risks, benefits and alternatives for the proposed anesthesia with the patient or authorized representative who has indicated his/her understanding and acceptance.     Dental advisory given  Plan Discussed with: CRNA  Anesthesia Plan Comments:          Anesthesia Quick Evaluation

## 2024-03-19 NOTE — Progress Notes (Signed)
 Patient ID: Zachary Schneider, male   DOB: [birth year], 54 y.o.   MRN: [id number] Patient is postoperative day 1 debridement abscess that involve the dorsum of the right foot and medial aspect of the right leg.  Cultures are showing gram-positive cocci.  Wound VAC has 350 cc.  Plan for return to the operating room tomorrow for repeat debridement

## 2024-03-19 NOTE — TOC CM/SW Note (Deleted)
 Transition of Care [facility]) - Inpatient Brief Assessment   Patient Details  Name: MAXTYN NUZUM MRN: [id number] Date of Birth: [birth year]  Transition of [facility]) CM/SW Contact:    Lauraine FORBES Saa, LCSWA Phone Number: 03/19/2024, 8:57 AM   Clinical Narrative:  8:58 AM Per chart review, patient resides at home with significant other. Patient does not have a PCP or insurance. CSW consulted RNCM and financial counseling for further assistance. Patient does not have SNF/HH/DME history. Patient's preferred pharmacy is [facility] [address]. TOC will continue to follow and be available to assist.  Transition of Care Asessment: Insurance and Status: Selfpay Patient has primary care physician: No Home environment has been reviewed: Private Residence Prior level of function:: N/A Prior/Current Home Services: No current home services Social Drivers of Health Review: SDOH reviewed no interventions necessary Readmission risk has been reviewed: Yes (Currently Green 10%) Transition of care needs: transition of care needs identified, TOC will continue to follow

## 2024-03-20 ENCOUNTER — Other Ambulatory Visit: Payer: Self-pay

## 2024-03-20 ENCOUNTER — Inpatient Hospital Stay (HOSPITAL_COMMUNITY): Payer: Self-pay | Admitting: Anesthesiology

## 2024-03-20 ENCOUNTER — Encounter (HOSPITAL_COMMUNITY)
Admission: EM | Disposition: A | Discharge status: Home / Self Care | Payer: Self-pay | Source: Home / Self Care | Attending: Family Medicine

## 2024-03-20 ENCOUNTER — Encounter (HOSPITAL_COMMUNITY): Payer: Self-pay | Admitting: Family Medicine

## 2024-03-20 DIAGNOSIS — L02611 Cutaneous abscess of right foot: Secondary | ICD-10-CM

## 2024-03-20 DIAGNOSIS — E1165 Type 2 diabetes mellitus with hyperglycemia: Secondary | ICD-10-CM

## 2024-03-20 DIAGNOSIS — E11621 Type 2 diabetes mellitus with foot ulcer: Secondary | ICD-10-CM

## 2024-03-20 DIAGNOSIS — L97909 Non-pressure chronic ulcer of unspecified part of unspecified lower leg with unspecified severity: Secondary | ICD-10-CM

## 2024-03-20 HISTORY — PX: INCISION AND DRAINAGE OF DEEP ABSCESS, CALF: SHX7361

## 2024-03-20 LAB — BASIC METABOLIC PANEL WITH GFR
Anion gap: 14 (ref 5–15)
BUN: 5 mg/dL — ABNORMAL LOW (ref 6–20)
CO2: 22 mmol/L (ref 22–32)
Calcium: 8.1 mg/dL — ABNORMAL LOW (ref 8.9–10.3)
Chloride: 100 mmol/L (ref 98–111)
Creatinine, Ser: 0.42 mg/dL — ABNORMAL LOW (ref 0.61–1.24)
GFR, Estimated: >60 mL/min (ref >60)
Glucose, Bld: 206 mg/dL — ABNORMAL HIGH (ref 70–99)
Potassium: 3.8 mmol/L (ref 3.5–5.1)
Sodium: 136 mmol/L (ref 135–145)

## 2024-03-20 LAB — GLUCOSE, CAPILLARY
Glucose-Capillary: 188 mg/dL — ABNORMAL HIGH (ref 70–99)
Glucose-Capillary: 196 mg/dL — ABNORMAL HIGH (ref 70–99)
Glucose-Capillary: 185 mg/dL — ABNORMAL HIGH (ref 70–99)
Glucose-Capillary: 178 mg/dL — ABNORMAL HIGH (ref 70–99)
Glucose-Capillary: 190 mg/dL — ABNORMAL HIGH (ref 70–99)

## 2024-03-20 LAB — ABO/RH: ABO/RH(D): O POS

## 2024-03-20 LAB — CBC
HCT: 30.2 % — ABNORMAL LOW (ref 39.0–52.0)
Hemoglobin: 9.9 g/dL — ABNORMAL LOW (ref 13.0–17.0)
MCH: 27.4 pg (ref 26.0–34.0)
MCHC: 32.8 g/dL (ref 30.0–36.0)
MCV: 83.7 fL (ref 80.0–100.0)
Platelets: 285 K/uL (ref 150–400)
RBC: 3.61 MIL/uL — ABNORMAL LOW (ref 4.22–5.81)
RDW: 14.5 % (ref 11.5–15.5)
WBC: 10.1 K/uL (ref 4.0–10.5)
nRBC: 0 % (ref 0.0–0.2)

## 2024-03-20 LAB — TYPE AND SCREEN
ABO/RH(D): O POS
Antibody Screen: NEGATIVE

## 2024-03-20 SURGERY — INCISION AND DRAINAGE OF DEEP ABSCESS, CALF
Anesthesia: General | Site: Leg Lower | Laterality: Right

## 2024-03-20 MED ORDER — PHENYLEPHRINE 80 MCG/ML (10ML) SYRINGE FOR IV PUSH (FOR BLOOD PRESSURE SUPPORT)
PREFILLED_SYRINGE | INTRAVENOUS | Status: DC | PRN
  Administered 2024-03-20: 160 ug via INTRAVENOUS
  Administered 2024-03-20: 120 ug via INTRAVENOUS

## 2024-03-20 MED ORDER — LIDOCAINE 2% (20 MG/ML) 5 ML SYRINGE
INTRAMUSCULAR | Status: AC
  Filled 2024-03-20: qty 5

## 2024-03-20 MED ORDER — OXYCODONE HCL 5 MG/5ML PO SOLN: 5.0000 mg | Freq: Once | ORAL | Status: DC | PRN

## 2024-03-20 MED ORDER — VASHE WOUND IRRIGATION OPTIME
TOPICAL | Status: DC | PRN
  Administered 2024-03-20: 34 [oz_av]

## 2024-03-20 MED ORDER — MIDAZOLAM HCL 2 MG/2ML IJ SOLN
INTRAMUSCULAR | Status: AC
  Filled 2024-03-20: qty 2

## 2024-03-20 MED ORDER — ONDANSETRON HCL 4 MG/2ML IJ SOLN
INTRAMUSCULAR | Status: AC
  Filled 2024-03-20: qty 2

## 2024-03-20 MED ORDER — AMISULPRIDE (ANTIEMETIC) 5 MG/2ML IV SOLN: 10.0000 mg | Freq: Once | INTRAVENOUS | Status: DC | PRN

## 2024-03-20 MED ORDER — FENTANYL CITRATE (PF) 250 MCG/5ML IJ SOLN
INTRAMUSCULAR | Status: DC | PRN
  Administered 2024-03-20 (×3): 50 ug via INTRAVENOUS

## 2024-03-20 MED ORDER — POVIDONE-IODINE 10 % EX SWAB
2.0000 | Freq: Once | CUTANEOUS | Status: AC
  Administered 2024-03-20: 2 via TOPICAL

## 2024-03-20 MED ORDER — VANCOMYCIN HCL 1 G IV SOLR
INTRAVENOUS | Status: DC | PRN
  Administered 2024-03-20: 1

## 2024-03-20 MED ORDER — LIDOCAINE 2% (20 MG/ML) 5 ML SYRINGE
INTRAMUSCULAR | Status: DC | PRN
  Administered 2024-03-20: 100 mg via INTRAVENOUS

## 2024-03-20 MED ORDER — PROPOFOL 10 MG/ML IV BOLUS
INTRAVENOUS | Status: AC
Start: 2024-03-20 — End: 2024-03-20
  Filled 2024-03-20: qty 20

## 2024-03-20 MED ORDER — ONDANSETRON HCL 4 MG/2ML IJ SOLN
INTRAMUSCULAR | Status: DC | PRN
  Administered 2024-03-20: 4 mg via INTRAVENOUS

## 2024-03-20 MED ORDER — FENTANYL CITRATE (PF) 100 MCG/2ML IJ SOLN
25.0000 ug | INTRAMUSCULAR | Status: DC | PRN
  Administered 2024-03-20: 25 ug via INTRAVENOUS

## 2024-03-20 MED ORDER — VANCOMYCIN HCL 1000 MG IV SOLR
INTRAVENOUS | Status: AC
  Filled 2024-03-20: qty 20

## 2024-03-20 MED ORDER — PROPOFOL 10 MG/ML IV BOLUS
INTRAVENOUS | Status: DC | PRN
  Administered 2024-03-20: 200 mg via INTRAVENOUS

## 2024-03-20 MED ORDER — OXYCODONE HCL 5 MG PO TABS: 5.0000 mg | ORAL_TABLET | Freq: Once | ORAL | Status: DC | PRN

## 2024-03-20 MED ORDER — FENTANYL CITRATE (PF) 100 MCG/2ML IJ SOLN
INTRAMUSCULAR | Status: AC
  Filled 2024-03-20: qty 2

## 2024-03-20 MED ORDER — EPHEDRINE SULFATE-NACL 50-0.9 MG/10ML-% IV SOSY
PREFILLED_SYRINGE | INTRAVENOUS | Status: DC | PRN
  Administered 2024-03-20: 7.5 mg via INTRAVENOUS

## 2024-03-20 MED ORDER — CHLORHEXIDINE GLUCONATE 0.12 % MT SOLN
15.0000 mL | Freq: Once | OROMUCOSAL | Status: AC
  Administered 2024-03-20: 15 mL via OROMUCOSAL
  Filled 2024-03-20: qty 15

## 2024-03-20 MED ORDER — LACTATED RINGERS IV SOLN
INTRAVENOUS | Status: DC
Start: 2024-03-20 — End: 2024-03-20

## 2024-03-20 MED ORDER — FENTANYL CITRATE (PF) 250 MCG/5ML IJ SOLN
INTRAMUSCULAR | Status: AC
  Filled 2024-03-20: qty 5

## 2024-03-20 MED ORDER — ORAL CARE MOUTH RINSE: 15.0000 mL | Freq: Once | OROMUCOSAL | Status: AC

## 2024-03-20 SURGICAL SUPPLY — 38 items
BAG COUNTER SPONGE SURGICOUNT (BAG) IMPLANT
BLADE SURG 21 STRL SS (BLADE) ×2 IMPLANT
BNDG COHESIVE 4X5 TAN STRL LF (GAUZE/BANDAGES/DRESSINGS) IMPLANT
BNDG COHESIVE 6X5 TAN NS LF (GAUZE/BANDAGES/DRESSINGS) IMPLANT
BNDG COHESIVE 6X5 TAN ST LF (GAUZE/BANDAGES/DRESSINGS) IMPLANT
BNDG GAUZE DERMACEA FLUFF 4 (GAUZE/BANDAGES/DRESSINGS) IMPLANT
CANISTER WOUNDNEG PRESSURE 500 (CANNISTER) IMPLANT
CLEANSER WND VASHE 34 (WOUND CARE) IMPLANT
CLEANSER WND VASHE INSTL 34OZ (WOUND CARE) IMPLANT
COVER SURGICAL LIGHT HANDLE (MISCELLANEOUS) ×4 IMPLANT
DRAPE DERMATAC (DRAPES) IMPLANT
DRAPE U-SHAPE 47X51 STRL (DRAPES) ×2 IMPLANT
DRESSING PREVENA PLUS CUSTOM (GAUZE/BANDAGES/DRESSINGS) IMPLANT
DRESSING VERAFLO CLEANS CC MED (GAUZE/BANDAGES/DRESSINGS) IMPLANT
DRSG VAC PEEL AND PLACE LRG (GAUZE/BANDAGES/DRESSINGS) IMPLANT
DURAPREP 26ML APPLICATOR (WOUND CARE) ×2 IMPLANT
ELECTRODE REM PT RTRN 9FT ADLT (ELECTROSURGICAL) IMPLANT
GAUZE PAD ABD 8X10 STRL (GAUZE/BANDAGES/DRESSINGS) IMPLANT
GAUZE SPONGE 4X4 12PLY STRL (GAUZE/BANDAGES/DRESSINGS) IMPLANT
GLOVE BIOGEL PI IND STRL 9 (GLOVE) ×2 IMPLANT
GLOVE SURG ORTHO 9.0 STRL STRW (GLOVE) ×2 IMPLANT
GOWN STRL REUS W/ TWL XL LVL3 (GOWN DISPOSABLE) ×4 IMPLANT
GRAFT SKIN WND SURGICLOSE M95 (Tissue) IMPLANT
KIT BASIN OR (CUSTOM PROCEDURE TRAY) ×2 IMPLANT
KIT TURNOVER KIT B (KITS) ×2 IMPLANT
MANIFOLD NEPTUNE II (INSTRUMENTS) ×2 IMPLANT
NS IRRIG 1000ML POUR BTL (IV SOLUTION) ×2 IMPLANT
PACK ORTHO EXTREMITY (CUSTOM PROCEDURE TRAY) ×2 IMPLANT
PAD ARMBOARD POSITIONER FOAM (MISCELLANEOUS) ×4 IMPLANT
PAD NEG PRESSURE SENSATRAC (MISCELLANEOUS) IMPLANT
SET HNDPC FAN SPRY TIP SCT (DISPOSABLE) IMPLANT
STOCKINETTE IMPERVIOUS 9X36 MD (GAUZE/BANDAGES/DRESSINGS) IMPLANT
SUT ETHILON 2 0 PSLX (SUTURE) IMPLANT
SWAB COLLECTION DEVICE MRSA (MISCELLANEOUS) ×2 IMPLANT
SWAB CULTURE ESWAB REG 1ML (MISCELLANEOUS) IMPLANT
TOWEL GREEN STERILE (TOWEL DISPOSABLE) ×2 IMPLANT
TUBE CONNECTING 12X1/4 (SUCTIONS) ×2 IMPLANT
YANKAUER SUCT BULB TIP NO VENT (SUCTIONS) ×2 IMPLANT

## 2024-03-20 NOTE — Op Note (Signed)
 03/20/2024  1:48 PM  PATIENT:  Zachary Schneider    PRE-OPERATIVE DIAGNOSIS:  Abscess Right Leg and right foot  POST-OPERATIVE DIAGNOSIS:  Same  PROCEDURE: Excisional debridement right calf with excision skin soft tissue muscle and fascia. Excisional debridement right foot with excision of skin soft tissue muscle and fascia. Application of Kerecis micro graft 95 cm and 1 g vancomycin  powder. Local tissue transfer for wound closure 45 x 10 cm. Application of cleanse choice wound VAC sponge and peel in place wound VAC sponge.  SURGEON:  Jerona LULLA Sage, MD  PHYSICIAN ASSISTANT:None ANESTHESIA:   General  PREOPERATIVE INDICATIONS:  Zachary Schneider is a  53 y.o. male with a diagnosis of Abscess Right Leg who failed conservative measures and elected for surgical management.    The risks benefits and alternatives were discussed with the patient preoperatively including but not limited to the risks of infection, bleeding, nerve injury, cardiopulmonary complications, the need for revision surgery, among others, and the patient was willing to proceed.  OPERATIVE IMPLANTS:   Implant Name Type Inv. Item Serial No. Manufacturer Lot No. LRB No. Used Action  GRAFT SKIN WND SURGICLOSE M95 - [id number] Tissue GRAFT SKIN WND SURGICLOSE M95  [employer] [id number] Right 1 Implanted    @ENCIMAGES @  OPERATIVE FINDINGS: Postoperatively the muscle had good color and contractility.  There was progressive muscle that was not viable there was more necrotic fascia.  There is exposed tibial crest.  OPERATIVE PROCEDURE: Patient was brought to the operating room and underwent general anesthetic.  After adequate levels anesthesia obtained patient's right lower extremity was prepped using DuraPrep draped into a sterile field a timeout was called.  The incision dorsally over the leg and foot was extended proximally and distally.  There was more necrotic muscle that was excised from the medial gastrocnemius as well  as the anterior compartment.  The anterior neurovascular bundle was intact.  There was more fascia that was not viable and fascia medially and laterally was excised.  After debridement electrocautery was used for hemostasis the wound was irrigated with Vashe.  A 21 blade knife and rondure and Cobb elevator were used for resection of the nonviable tissue.  There was a large amount of hematoma medially and laterally and this was decompressed.  After irrigation with Vashe the wound bed was filled with 95 cm of Kerecis micro graft and 1 g vancomycin  powder to cover wound surface area greater than 450 cm.  The tissue margins were undermined for allow for local tissue transfer.  Local tissue transfer was performed and secured with 2-0 nylon 445 x 10 cm.  There was 1 small area mid distal that was covered with the cleanse choice sponge and the remainder of the wound was covered with a peel in place sponge and derma tack proximally and distally.  This was secured with Ioban and secured with Coban.  Patient was extubated taken the PACU in stable condition.   DISCHARGE PLANNING:  Antibiotic duration: Continue IV antibiotics  Weightbearing: Weightbearing as tolerated on the right  Pain medication: Opioid pathway  Dressing care/ Wound VAC: Continue wound VAC for 1 week  Ambulatory devices: Walker or crutches  Discharge to: Discharge planning based on therapy recommendations.  Hold heparin  for 3 days.  Follow-up: In the office 1 week post operative.

## 2024-03-20 NOTE — Anesthesia Procedure Notes (Signed)
 Procedure Name: LMA Insertion Date/Time: 03/20/2024 12:54 PM  Performed by: Kearney Rosina SAILOR, RNPre-anesthesia Checklist: Patient identified, Emergency Drugs available, Suction available, Patient being monitored and Timeout performed Patient Re-evaluated:Patient Re-evaluated prior to induction Oxygen  Delivery Method: Circle system utilized Preoxygenation: Pre-oxygenation with 100% oxygen  Induction Type: IV induction Ventilation: Mask ventilation without difficulty and Two handed mask ventilation required LMA: LMA inserted LMA Size: 5.0 Number of attempts: 1 Tube secured with: Tape Dental Injury: Teeth and Oropharynx as per pre-operative assessment  Comments: atraumatic

## 2024-03-20 NOTE — Anesthesia Postprocedure Evaluation (Signed)
 Anesthesia Post Note  Patient: Zachary Schneider  Procedure(s) Performed: INCISION AND DRAINAGE OF DEEP ABSCESS, CALF (Right: Leg Lower)     Patient location during evaluation: PACU Anesthesia Type: General Level of consciousness: awake Pain management: pain level controlled Vital Signs Assessment: post-procedure vital signs reviewed and stable Respiratory status: spontaneous breathing, nonlabored ventilation and respiratory function stable Cardiovascular status: blood pressure returned to baseline and stable Postop Assessment: no apparent nausea or vomiting Anesthetic complications: no   There were no known notable events for this encounter.  Last Vitals:  Vitals:   03/20/24 1400 03/20/24 1415  BP: 132/74 126/77  Pulse: 77 78  Resp: 13 10  Temp:  37.1 C  SpO2: 92% 94%    Last Pain:  Vitals:   03/20/24 1400  TempSrc:   PainSc: 0-No pain        RLE Motor Response: Purposeful movement (03/20/24 1415) RLE Sensation: Full sensation (03/20/24 1415)      Delon Aisha Arch

## 2024-03-20 NOTE — Progress Notes (Signed)
 PROGRESS NOTE  Zachary Schneider  FMW:[id number] DOB: [birth year] DOA: 03/16/2024 PCP: Pcp, No  Consultants  Brief Narrative: 53 y.o. male with medical history significant for testicular cancer and poorly controlled type 2 diabetes mellitus who presents with worsening redness, swelling, and pain involving the lower right leg.  Recently seen on 9/11 for calf tenderness swelling and erythema initiated on doxycycline  with worsening symptoms concerning for failure of outpatient therapy.  Initially admitted at [facility], transferred to [facility] per orthopedics recommendation for surgical debridement.   Assessment & Plan: RLE disseminated cellulitis/Tibialis posterior tenosynovitis  - Patient did not meet sepsis criteria.  Started on cefazolin , cellulitis did not improve, transferred to [facility] and underwent excisional debridement right calf and right foot, Soft tissue sent for cultures which is growing gram-positive cocci-->moderate staph aureus/Groub B strep.  Susceptibilities pending.   - Wound vac in place   - now s/p next stage surgical debridement earlier today. Seen after surgery.  Some pain in leg but otherwise no complaints. Patient remains on cefepime , will continue for now.    Type II DM, uncontrolled with hyperglycemia/history of noncompliance to medications: - A1c 12.3, noncompliant with insulin  and metformin .  Currently on 30 units of Lantus  but is still hyperglycemic with blood sugar in 200 range, received 30 units this morning already, will add 10 units and change order to 40 units starting tomorrow.  Continue SSI.   Acute anemia:  - Hemoglobin was within normal range few days ago, now trending down and 9.9 today.  No history of hematemesis, hematochezia or melena.  Will check iron studies, B12, folate and FOBT.  Monitor closely.  Transfuse if less than 7. - Recheck tomorrow s/p surgery today.     Hypokalemia: Will replenish.   Class II morbid obesity: BMI almost 35.  Weight loss  and diet modification counseled.       DVT prophylaxis:  holding heparin  x 3 days due to high risk of bleeding per Ortho rec's  Code Status:   Code Status: Full Code Level of care: Med-Surg Status is: Inpatient  Consults called: Orthopedics   Subjective: Patient seen after surgery, sleeping.  Some pain in leg, but otherwise no complaints, doing well.    Objective: Vitals:   03/20/24 0909 03/20/24 1345 03/20/24 1400 03/20/24 1415  BP: 133/73 (!) 144/79 132/74 126/77  Pulse: 76 85 77 78  Resp: 19 17 13 10   Temp: 98 F (36.7 C) 98.6 F (37 C)  98.8 F (37.1 C)  TempSrc: Oral     SpO2: 93% 99% 92% 94%  Weight: 113.4 kg     Height: 6' 1 (1.854 m)       Intake/Output Summary (Last 24 hours) at 03/20/2024 1740 Last data filed at 03/20/2024 1503 Gross per 24 hour  Intake 500 ml  Output 1075 ml  Net -575 ml   Filed Weights   03/18/24 1249 03/18/24 1732 03/20/24 0909  Weight: 113.4 kg 119.9 kg 113.4 kg   Body mass index is 32.98 kg/m.  Gen: 53 y.o. male in no apparent distress.  Nontoxic Pulm: Non-labored breathing.  Clear to auscultation bilaterally.  CV: Regular rate and rhythm. No murmur, rub, or gallop. No JVD GI: Abdomen soft, non-tender, non-distended Ext: Warm.  LLE WNL, Right LE with bandage in place and wound vac with some blood in tubing.   Skin: No rashes, lesions  Neuro: Alert and oriented. No focal neurological deficits. Psych: Calm  Judgement and insight appear normal. Mood & affect  appropriate.    I have personally reviewed the following labs and images: CBC: Recent Labs  Lab 03/16/24 1657 03/17/24 0052 03/18/24 0341 03/19/24 0455 03/20/24 0402  WBC 13.9* 14.9* 12.7* 12.9* 10.1  NEUTROABS 9.9*  --   --   --   --   HGB 13.5 11.9* 11.1* 9.9* 9.9*  HCT 42.7 39.4 35.0* 30.2* 30.2*  MCV 83.6 86.4 85.4 83.9 83.7  PLT 309 283 272 289 285   BMP &GFR Recent Labs  Lab 03/16/24 1657 03/17/24 0052 03/18/24 0341 03/19/24 0455 03/20/24 0402  NA  134* 130* 136 132* 136  K 3.9 3.7 3.6 3.3* 3.8  CL 96* 97* 101 96* 100  CO2 25 24 24 25 22   GLUCOSE 407* 424* 268* 255* 206*  BUN 9 8 6 7  5*  CREATININE 0.61 0.69 0.53* 0.64 0.42*  CALCIUM  9.2 8.2* 8.2* 7.6* 8.1*   Estimated Creatinine Clearance: 140.9 mL/min (A) (by C-G formula based on SCr of 0.42 mg/dL (L)). Liver & Pancreas: Recent Labs  Lab 03/16/24 1657  AST 13*  ALT 12  ALKPHOS 91  BILITOT 0.6  PROT 7.0  ALBUMIN 3.3*   No results for input(s): LIPASE, AMYLASE in the last 168 hours. No results for input(s): AMMONIA in the last 168 hours. Diabetic: No results for input(s): HGBA1C in the last 72 hours. Recent Labs  Lab 03/19/24 2105 03/20/24 0822 03/20/24 0916 03/20/24 1113 03/20/24 1348  GLUCAP 190* 188* 196* 185* 178*   Cardiac Enzymes: No results for input(s): CKTOTAL, CKMB, CKMBINDEX, TROPONINI in the last 168 hours. No results for input(s): PROBNP in the last 8760 hours. Coagulation Profile: Recent Labs  Lab 03/16/24 1657  INR 1.2   Thyroid Function Tests: No results for input(s): TSH, T4TOTAL, FREET4, T3FREE, THYROIDAB in the last 72 hours. Lipid Profile: No results for input(s): CHOL, HDL, LDLCALC, TRIG, CHOLHDL, LDLDIRECT in the last 72 hours. Anemia Panel: Recent Labs    03/19/24 1038  VITAMINB12 304  FOLATE 11.2  FERRITIN 261  TIBC 183*  IRON 16*  RETICCTPCT 2.7   Urine analysis:    Component Value Date/Time   COLORURINE STRAW (A) 08/31/2023 1732   APPEARANCEUR CLEAR 08/31/2023 1732   LABSPEC 1.029 08/31/2023 1732   PHURINE 6.0 08/31/2023 1732   GLUCOSEU >=500 (A) 08/31/2023 1732   HGBUR MODERATE (A) 08/31/2023 1732   BILIRUBINUR NEGATIVE 08/31/2023 1732   KETONESUR NEGATIVE 08/31/2023 1732   PROTEINUR NEGATIVE 08/31/2023 1732   NITRITE NEGATIVE 08/31/2023 1732   LEUKOCYTESUR NEGATIVE 08/31/2023 1732   Sepsis Labs: Invalid input(s): PROCALCITONIN, LACTICIDVEN  Microbiology: Recent  Results (from the past 240 hours)  Blood Culture (routine x 2)     Status: None (Preliminary result)   Collection Time: 03/16/24  4:57 PM   Specimen: BLOOD RIGHT ARM  Result Value Ref Range Status   Specimen Description   Final    BLOOD RIGHT ARM Performed at [facility], [address], [place], [place] [address]    Special Requests   Final    BOTTLES DRAWN AEROBIC AND ANAEROBIC Blood Culture adequate volume Performed at [facility], [address], [place], [place] [address]    Culture   Final    NO GROWTH 4 DAYS Performed at [facility], [address] [place], [place] [address]    Report Status PENDING  Incomplete  Blood Culture (routine x 2)     Status: None (Preliminary result)   Collection Time: 03/16/24 10:18 PM  Specimen: BLOOD  Result Value Ref Range Status   Specimen Description   Final    BLOOD RIGHT ANTECUBITAL Performed at [facility], [address] [place], [place] [address]    Special Requests   Final    Blood Culture results may not be optimal due to an inadequate volume of blood received in culture bottles BOTTLES DRAWN AEROBIC AND ANAEROBIC Performed at [facility], [address] [place], [place] [address]    Culture   Final    NO GROWTH 3 DAYS Performed at [facility], [address] [place], [place] [address]    Report Status PENDING  Incomplete  Aerobic/Anaerobic Culture w Gram Stain (surgical/deep wound)     Status: None (Preliminary result)   Collection Time: 03/18/24  3:08 PM   Specimen: Leg, Right; Tissue  Result Value Ref Range Status   Specimen Description TISSUE RIGHT LEG  Final   Special Requests NONE  Final   Gram Stain   Final    FEW WBC PRESENT, PREDOMINANTLY PMN RARE GRAM POSITIVE COCCI IN PAIRS Performed at [facility], [address] [place], [place] [address]    Culture   Final    MODERATE STAPHYLOCOCCUS AUREUS SUSCEPTIBILITIES TO  FOLLOW MODERATE GROUP B STREP(S.AGALACTIAE)ISOLATED TESTING AGAINST S. AGALACTIAE NOT ROUTINELY PERFORMED DUE TO PREDICTABILITY OF AMP/PEN/VAN SUSCEPTIBILITY. NO ANAEROBES ISOLATED; CULTURE IN PROGRESS FOR 5 DAYS    Report Status PENDING  Incomplete    Radiology Studies: No results found.  Scheduled Meds:  influenza vac split trivalent PF  0.5 mL Intramuscular Tomorrow-1000   insulin  aspart  0-5 Units Subcutaneous QHS   insulin  aspart  0-6 Units Subcutaneous TID WC   insulin  glargine  40 Units Subcutaneous Daily   sodium chloride  flush  3 mL Intravenous Q12H   Continuous Infusions:  ceFEPime  (MAXIPIME ) IV 2 g (03/20/24 1521)   lactated ringers  100 mL/hr at 03/18/24 1452   vancomycin  1,500 mg (03/20/24 1714)     LOS: 4 days   35 minutes with more than 50% spent in reviewing records, counseling patient/family and coordinating care.  Reyes VEAR Gaw, MD [facility] [email or web address] 03/20/2024, 5:40 PM

## 2024-03-20 NOTE — Transfer of Care (Signed)
 Immediate Anesthesia Transfer of Care Note  Patient: Zachary Schneider  Procedure(s) Performed: INCISION AND DRAINAGE OF DEEP ABSCESS, CALF (Right: Leg Lower)  Patient Location: PACU  Anesthesia Type:General  Level of Consciousness: alert  and patient cooperative  Airway & Oxygen  Therapy: Patient Spontanous Breathing and Patient connected to face mask oxygen   Post-op Assessment: Report given to RN and Post -op Vital signs reviewed and stable  Post vital signs: Reviewed and stable  Last Vitals:  Vitals Value Taken Time  BP 144/79 03/20/24 13:45  Temp    Pulse 82 03/20/24 13:47  Resp 16 03/20/24 13:47  SpO2 96 % 03/20/24 13:47  Vitals shown include unfiled device data.  Last Pain:  Vitals:   03/20/24 1028  TempSrc:   PainSc: 6       Patients Stated Pain Goal: 2 (03/20/24 1028)  Complications: There were no known notable events for this encounter.

## 2024-03-20 NOTE — Interval H&P Note (Signed)
 History and Physical Interval Note:  03/20/2024 6:46 AM  Zachary Schneider  has presented today for surgery, with the diagnosis of Abscess Right Leg.  The various methods of treatment have been discussed with the patient and family. After consideration of risks, benefits and other options for treatment, the patient has consented to  Procedure(s) with comments: INCISION AND DRAINAGE OF DEEP ABSCESS, CALF (Right) - IRRIGATION AND DEBRIDEMENT WOUND RIGHT LEG as a surgical intervention.  The patient's history has been reviewed, patient examined, no change in status, stable for surgery.  I have reviewed the patient's chart and labs.  Questions were answered to the patient's satisfaction.     Zachary Schneider V Jayvan Mcshan

## 2024-03-20 NOTE — Progress Notes (Signed)
 [facility]  Plan to hold heparin  for 3 days starting today.  May resume on 03/24/24.  High risk of bleeding.  Maurilio Deland Collet PA-C

## 2024-03-20 NOTE — Plan of Care (Signed)

## 2024-03-20 NOTE — Plan of Care (Signed)
  Problem: Coping: Goal: Ability to adjust to condition or change in health will improve Outcome: Progressing   Problem: Metabolic: Goal: Ability to maintain appropriate glucose levels will improve Outcome: Progressing   Problem: Nutritional: Goal: Maintenance of adequate nutrition will improve Outcome: Progressing Goal: Progress toward achieving an optimal weight will improve Outcome: Progressing   Problem: Skin Integrity: Goal: Risk for impaired skin integrity will decrease Outcome: Progressing   Problem: Education: Goal: Knowledge of General Education information will improve Description: Including pain rating scale, medication(s)/side effects and non-pharmacologic comfort measures Outcome: Progressing

## 2024-03-20 NOTE — Inpatient Diabetes Management (Addendum)
 Inpatient Diabetes Program Recommendations  AACE/ADA: New Consensus Statement on Inpatient Glycemic Control   Target Ranges:  Prepandial:   less than 140 mg/dL      Peak postprandial:   less than 180 mg/dL (1-2 hours)      Critically ill patients:  140 - 180 mg/dL    Latest Reference Range & Units 03/19/24 07:57 03/19/24 12:07 03/19/24 16:45 03/19/24 21:05  Glucose-Capillary 70 - 99 mg/dL 702 (H) 738 (H) 781 (H) 190 (H)   Review of Glycemic Control  Diabetes history: DM2 Outpatient Diabetes medications: 70/30 75 units BID (not taking), Metformin  500 mg QAM (not taking) Current orders for Inpatient glycemic control: Lantus  40 units daily, Novolog  0-6 units TID with meals, Novolog  0-5 units QHS  Inpatient Diabetes Program Recommendations:    Insulin : Please consider increasing Lantus  to 45 units daily and once diet ordered consider ordering Novolog  4 units TID with meals for meal coverage if patient eats at least 50% of meals.  Discharge Recommendations: Intermediate acting recommendations: Insulin  Lispro Prot & Lispro (HUMALOG 75/25) Kwikpen To be determined at DC  Pen needles (#[id number])  Use Adult Diabetes Insulin  Treatment Post Discharge order set.  Thanks, Earnie Gainer, RN, MSN, CDCES Diabetes Coordinator Inpatient Diabetes Program [phone] (Team Pager from 8am to 5pm)

## 2024-03-21 LAB — CBC
HCT: 28.9 % — ABNORMAL LOW (ref 39.0–52.0)
Hemoglobin: 9.4 g/dL — ABNORMAL LOW (ref 13.0–17.0)
MCH: 27.4 pg (ref 26.0–34.0)
MCHC: 32.5 g/dL (ref 30.0–36.0)
MCV: 84.3 fL (ref 80.0–100.0)
Platelets: 280 K/uL (ref 150–400)
RBC: 3.43 MIL/uL — ABNORMAL LOW (ref 4.22–5.81)
RDW: 14.7 % (ref 11.5–15.5)
WBC: 11.9 K/uL — ABNORMAL HIGH (ref 4.0–10.5)
nRBC: 0 % (ref 0.0–0.2)

## 2024-03-21 LAB — GLUCOSE, CAPILLARY
Glucose-Capillary: 168 mg/dL — ABNORMAL HIGH (ref 70–99)
Glucose-Capillary: 176 mg/dL — ABNORMAL HIGH (ref 70–99)
Glucose-Capillary: 179 mg/dL — ABNORMAL HIGH (ref 70–99)
Glucose-Capillary: 218 mg/dL — ABNORMAL HIGH (ref 70–99)

## 2024-03-21 LAB — BASIC METABOLIC PANEL WITH GFR
Anion gap: 10 (ref 5–15)
BUN: <5 mg/dL — ABNORMAL LOW (ref 6–20)
CO2: 25 mmol/L (ref 22–32)
Calcium: 7.7 mg/dL — ABNORMAL LOW (ref 8.9–10.3)
Chloride: 98 mmol/L (ref 98–111)
Creatinine, Ser: 0.56 mg/dL — ABNORMAL LOW (ref 0.61–1.24)
GFR, Estimated: >60 mL/min (ref >60)
Glucose, Bld: 170 mg/dL — ABNORMAL HIGH (ref 70–99)
Potassium: 3.8 mmol/L (ref 3.5–5.1)
Sodium: 133 mmol/L — ABNORMAL LOW (ref 135–145)

## 2024-03-21 LAB — CULTURE, BLOOD (ROUTINE X 2)
Culture: NO GROWTH
Special Requests: ADEQUATE

## 2024-03-21 MED ORDER — INSULIN GLARGINE 100 UNIT/ML ~~LOC~~ SOLN
45.0000 [IU] | Freq: Every day | SUBCUTANEOUS | Status: DC
Start: 2024-03-21 — End: 2024-03-23
  Administered 2024-03-21 – 2024-03-23 (×3): 45 [IU] via SUBCUTANEOUS
  Filled 2024-03-21 (×3): qty 0.45

## 2024-03-21 MED ORDER — AMOXICILLIN-POT CLAVULANATE 500-125 MG PO TABS
1.0000 | ORAL_TABLET | Freq: Three times a day (TID) | ORAL | Status: DC
  Administered 2024-03-21 (×3): 1 via ORAL
  Filled 2024-03-21 (×4): qty 1

## 2024-03-21 MED ORDER — LINEZOLID 600 MG PO TABS
600.0000 mg | ORAL_TABLET | Freq: Two times a day (BID) | ORAL | Status: DC
  Administered 2024-03-21 – 2024-03-23 (×5): 600 mg via ORAL
  Filled 2024-03-21 (×5): qty 1

## 2024-03-21 NOTE — Evaluation (Signed)
 Physical Therapy Evaluation Patient Details Name: Zachary Schneider MRN: [id number] DOB: [birth year] Today's Date: 03/21/2024  History of Present Illness  Pt is a 53 y.o. M who presents 03/16/2024 with RLE disseminated cellulitis/Tibialis posterior tenosynovitis. S/p multiple debridements. Significant PMH: testicular CA, poorly controlled type 2 diabetes mellitus.  Clinical Impression  Pt plans to d/c to a two story townhouse with his friend. He reports he can sleep on the couch on the main level and there is a 1/2 bath. Pt presents with RLE weakness, pain, and difficulty with weightbearing. Pt transitioning to edge of bed with increased time. Placed pillow underneath RLE for support due to pain in gravity dependent position. Pt performed hop pivot transfer with RW to and from [facility]. Unable to tolerate bearing any weight through RLE. Donned L tennis shoe which was helpful to offload opposite foot during transfer. Will continue to progress as tolerated.        If plan is discharge home, recommend the following: A little help with walking and/or transfers;A little help with bathing/dressing/bathroom;Assistance with cooking/housework;Assist for transportation;Help with stairs or ramp for entrance   Can travel by private vehicle        Equipment Recommendations Rolling walker (2 wheels);BSC/3in1;Wheelchair (measurements PT);Wheelchair cushion (measurements PT)  Recommendations for Other Services       Functional Status Assessment Patient has had a recent decline in their functional status and demonstrates the ability to make significant improvements in function in a reasonable and predictable amount of time.     Precautions / Restrictions Precautions Precautions: Fall Precaution/Restrictions Comments: Wound vac Restrictions Weight Bearing Restrictions Per Provider Order: Yes RLE Weight Bearing Per Provider Order: Weight bearing as tolerated      Mobility  Bed Mobility Overal bed mobility:  Needs Assistance Bed Mobility: Supine to Sit, Sit to Supine     Supine to sit: Supervision Sit to supine: Supervision   General bed mobility comments: Very slow and effortful, but able to complete without physical assist    Transfers Overall transfer level: Needs assistance Equipment used: Rolling walker (2 wheels) Transfers: Sit to/from Stand, Bed to chair/wheelchair/BSC Sit to Stand: Contact guard assist Stand pivot transfers: Contact guard assist         General transfer comment: Significantly increased time/effort, elevated bed height, powering up to stand on LLE and taking pivotal hops to and from [facility] with CGA    Ambulation/Gait                  Stairs            Wheelchair Mobility     Tilt Bed    Modified Rankin (Stroke Patients Only)       Balance Overall balance assessment: Needs assistance Sitting-balance support: Feet supported Sitting balance-Leahy Scale: Fair Sitting balance - Comments: Pillow support underneath RLE   Standing balance support: Bilateral upper extremity supported Standing balance-Leahy Scale: Poor                               Pertinent Vitals/Pain Pain Assessment Pain Assessment: Faces Faces Pain Scale: Hurts whole lot Pain Location: RLE Pain Descriptors / Indicators: Grimacing, Guarding Pain Intervention(s): Limited activity within patient's tolerance, Monitored during session    Home Living Family/patient expects to be discharged to:: Private residence Living Arrangements: Non-relatives/Friends Available Help at Discharge: Friend(s) Type of Home: House Home Access: Level entry       Home Layout: Two level;1/2 bath  on main level Home Equipment: None      Prior Function Prior Level of Function : Independent/Modified Independent             Mobility Comments: Works in [employer]       Extremity/Trunk Assessment   Upper Extremity Assessment Upper Extremity Assessment: Overall WFL for tasks  assessed    Lower Extremity Assessment Lower Extremity Assessment: RLE deficits/detail RLE Deficits / Details: Able to perform limited SLR, ankle dorsiflexion WFL    Cervical / Trunk Assessment Cervical / Trunk Assessment: Normal  Communication   Communication Communication: No apparent difficulties    Cognition Arousal: Alert Behavior During Therapy: WFL for tasks assessed/performed   PT - Cognitive impairments: No apparent impairments                         Following commands: Intact       Cueing Cueing Techniques: Verbal cues     General Comments      Exercises     Assessment/Plan    PT Assessment Patient needs continued PT services  PT Problem List Decreased strength;Decreased range of motion;Decreased activity tolerance;Decreased balance;Decreased mobility;Pain       PT Treatment Interventions DME instruction;Gait training;Stair training;Functional mobility training;Therapeutic activities;Therapeutic exercise;Balance training;Patient/family education;Wheelchair mobility training    PT Goals (Current goals can be found in the Care Plan section)  Acute Rehab PT Goals Patient Stated Goal: less pain PT Goal Formulation: With patient Time For Goal Achievement: 04/04/24 Potential to Achieve Goals: Good    Frequency Min 2X/week     Co-evaluation               AM-PAC PT 6 Clicks Mobility  Outcome Measure Help needed turning from your back to your side while in a flat bed without using bedrails?: A Little Help needed moving from lying on your back to sitting on the side of a flat bed without using bedrails?: A Little Help needed moving to and from a bed to a chair (including a wheelchair)?: A Little Help needed standing up from a chair using your arms (e.g., wheelchair or bedside chair)?: A Little Help needed to walk in hospital room?: Total Help needed climbing 3-5 steps with a railing? : Total 6 Click Score: 14    End of Session    Activity Tolerance: Patient limited by pain Patient left: in bed;with call bell/phone within reach   PT Visit Diagnosis: Difficulty in walking, not elsewhere classified (R26.2);Pain Pain - Right/Left: Right Pain - part of body: Leg    Time: 1347-1415 PT Time Calculation (min) (ACUTE ONLY): 28 min   Charges:   PT Evaluation $PT Eval Low Complexity: 1 Low PT Treatments $Therapeutic Activity: 8-22 mins PT General Charges $$ ACUTE PT VISIT: 1 Visit         Aleck Daring, PT, DPT Acute Rehabilitation Services Office [phone]   Aleck ONEIDA Daring 03/21/2024, 3:40 PM

## 2024-03-21 NOTE — Progress Notes (Addendum)
 PROGRESS NOTE  Zachary Schneider  FMW:[id number] DOB: 05/10/1971 DOA: 03/16/2024 PCP: Pcp, No  Consultants  Brief Narrative: 53 y.o. male with medical history significant for testicular cancer and poorly controlled type 2 diabetes mellitus who presents with worsening redness, swelling, and pain involving the lower right leg.  Recently seen on 9/11 for calf tenderness swelling and erythema initiated on doxycycline  with worsening symptoms concerning for failure of outpatient therapy.  Initially admitted at [facility], transferred to [facility] per orthopedics recommendation for surgical debridement.   Assessment & Plan: RLE disseminated cellulitis/Tibialis posterior tenosynovitis  - Patient did not meet sepsis criteria.  Started on cefazolin , cellulitis did not improve, transferred to [facility] and underwent excisional debridement right calf and right foot, Soft tissue sent for cultures which is growing gram-positive cocci-->moderate staph aureus/Group B strep.  Susceptibilities pending.   - Wound vac in place, leukocytosis downtrending. - Now status post surgical debridement times 08/11/2015, 919. - Switching to linezolid  plus Augmentin  for coverage of his MRSA/group B strep grown from cultures. -Needs to ambulate.   Type II DM, uncontrolled with hyperglycemia/history of noncompliance to medications: - A1c 12.3, nonadherent at home with insulin  and metformin .   - CBG still elevated here. - Increasing his Lantus  to 45 units today. - Continue SSI    Acute anemia:  - Hemoglobin was within normal range few days ago, trending downwards but now stable at 9's.  No history of hematemesis, hematochezia or melena.  Will check iron studies, B12, folate and FOBT.  Monitor closely.  Transfuse if less than 7.   Hypokalemia:  - Resolved after replacement.   Class II morbid obesity:  - BMI almost 35.  Weight loss and diet modification counseled.  Deconditioning: - Patient has not been out of bed for the  past several days.  Consulted PT OT today for recommendations at discharge for continued therapies.  Hyponatremia: - Borderline low sodium since at least March of this year. - Sodium 133, within his range of this year. - Will continue to trend.  Patient asymptomatic.  Hypocalcemia: - Last albumin was 9/15.  Would not be surprised if low.  Unclear at this point if this is pseudo hypocalcemia - Will check ionized calcium  and replenish as low.       DVT prophylaxis:  holding heparin  x 3 days due to high risk of bleeding per Ortho rec's  Code Status:   Code Status: Full Code Level of care: Med-Surg Status is: Inpatient Dispo: Pending PT evaluation Consults called: Orthopedics   Subjective: Patient seen after surgery, sleeping.  Some pain in leg, but otherwise no complaints, doing well.    Objective: Vitals:   03/20/24 1400 03/20/24 1415 03/20/24 1951 03/21/24 0354  BP: 132/74 126/77 (!) 143/77 117/73  Pulse: 77 78 74 68  Resp: 13 10 16 16   Temp:  98.8 F (37.1 C) 99.3 F (37.4 C) 98.9 F (37.2 C)  TempSrc:   Oral Oral  SpO2: 92% 94% 96% 98%  Weight:      Height:        Intake/Output Summary (Last 24 hours) at 03/21/2024 1042 Last data filed at 03/20/2024 2248 Gross per 24 hour  Intake 500 ml  Output 1900 ml  Net -1400 ml   Filed Weights   03/18/24 1249 03/18/24 1732 03/20/24 0909  Weight: 113.4 kg 119.9 kg 113.4 kg   Body mass index is 32.98 kg/m.  Gen: 53 y.o. male in no apparent distress.  Nontoxic Pulm: Non-labored breathing.  Clear to auscultation bilaterally.  CV: Regular rate and rhythm. No murmur, rub, or gallop. No JVD GI: Abdomen soft, non-tender, non-distended Ext: Warm.  LLE WNL, Right LE with bandage in place and wound vac with some blood in tubing/vac.   Skin: No rashes, lesions  Neuro: Alert and oriented. No focal neurological deficits. Psych: Calm  Judgement and insight appear normal. Mood & affect appropriate.    I have personally reviewed the  following labs and images: CBC: Recent Labs  Lab 03/16/24 1657 03/17/24 0052 03/18/24 0341 03/19/24 0455 03/20/24 0402 03/21/24 0802  WBC 13.9* 14.9* 12.7* 12.9* 10.1 11.9*  NEUTROABS 9.9*  --   --   --   --   --   HGB 13.5 11.9* 11.1* 9.9* 9.9* 9.4*  HCT 42.7 39.4 35.0* 30.2* 30.2* 28.9*  MCV 83.6 86.4 85.4 83.9 83.7 84.3  PLT 309 283 272 289 285 280   BMP &GFR Recent Labs  Lab 03/17/24 0052 03/18/24 0341 03/19/24 0455 03/20/24 0402 03/21/24 0802  NA 130* 136 132* 136 133*  K 3.7 3.6 3.3* 3.8 3.8  CL 97* 101 96* 100 98  CO2 24 24 25 22 25   GLUCOSE 424* 268* 255* 206* 170*  BUN 8 6 7  5* <5*  CREATININE 0.69 0.53* 0.64 0.42* 0.56*  CALCIUM  8.2* 8.2* 7.6* 8.1* 7.7*   Estimated Creatinine Clearance: 140.9 mL/min (A) (by C-G formula based on SCr of 0.56 mg/dL (L)). Liver & Pancreas: Recent Labs  Lab 03/16/24 1657  AST 13*  ALT 12  ALKPHOS 91  BILITOT 0.6  PROT 7.0  ALBUMIN 3.3*   No results for input(s): LIPASE, AMYLASE in the last 168 hours. No results for input(s): AMMONIA in the last 168 hours. Diabetic: No results for input(s): HGBA1C in the last 72 hours. Recent Labs  Lab 03/20/24 0916 03/20/24 1113 03/20/24 1348 03/20/24 2118 03/21/24 0827  GLUCAP 196* 185* 178* 190* 168*   Cardiac Enzymes: No results for input(s): CKTOTAL, CKMB, CKMBINDEX, TROPONINI in the last 168 hours. No results for input(s): PROBNP in the last 8760 hours. Coagulation Profile: Recent Labs  Lab 03/16/24 1657  INR 1.2   Thyroid Function Tests: No results for input(s): TSH, T4TOTAL, FREET4, T3FREE, THYROIDAB in the last 72 hours. Lipid Profile: No results for input(s): CHOL, HDL, LDLCALC, TRIG, CHOLHDL, LDLDIRECT in the last 72 hours. Anemia Panel: Recent Labs    03/19/24 1038  VITAMINB12 304  FOLATE 11.2  FERRITIN 261  TIBC 183*  IRON 16*  RETICCTPCT 2.7   Urine analysis:    Component Value Date/Time   COLORURINE STRAW  (A) 08/31/2023 1732   APPEARANCEUR CLEAR 08/31/2023 1732   LABSPEC 1.029 08/31/2023 1732   PHURINE 6.0 08/31/2023 1732   GLUCOSEU >=500 (A) 08/31/2023 1732   HGBUR MODERATE (A) 08/31/2023 1732   BILIRUBINUR NEGATIVE 08/31/2023 1732   KETONESUR NEGATIVE 08/31/2023 1732   PROTEINUR NEGATIVE 08/31/2023 1732   NITRITE NEGATIVE 08/31/2023 1732   LEUKOCYTESUR NEGATIVE 08/31/2023 1732   Sepsis Labs: Invalid input(s): PROCALCITONIN, LACTICIDVEN  Microbiology: Recent Results (from the past 240 hours)  Blood Culture (routine x 2)     Status: None   Collection Time: 03/16/24  4:57 PM   Specimen: BLOOD RIGHT ARM  Result Value Ref Range Status   Specimen Description   Final    BLOOD RIGHT ARM Performed at [facility], [address], [place], [place] [address]    Special Requests   Final    BOTTLES DRAWN AEROBIC  AND ANAEROBIC Blood Culture adequate volume Performed at [facility], [address], [place], [place] [address]    Culture   Final    NO GROWTH 5 DAYS Performed at [facility], [address], [place], [place] [address]    Report Status 03/21/2024 FINAL  Final  Blood Culture (routine x 2)     Status: None (Preliminary result)   Collection Time: 03/16/24 10:18 PM   Specimen: BLOOD  Result Value Ref Range Status   Specimen Description   Final    BLOOD RIGHT ANTECUBITAL Performed at [facility], [address] [place], [place] [address]    Special Requests   Final    Blood Culture results may not be optimal due to an inadequate volume of blood received in culture bottles BOTTLES DRAWN AEROBIC AND ANAEROBIC Performed at [facility], [address] [place], [place] [address]    Culture   Final    NO GROWTH 4 DAYS Performed at [facility], [address], [place], [place] [address]    Report Status PENDING  Incomplete  Aerobic/Anaerobic Culture w Gram Stain (surgical/deep wound)     Status:  None (Preliminary result)   Collection Time: 03/18/24  3:08 PM   Specimen: Leg, Right; Tissue  Result Value Ref Range Status   Specimen Description TISSUE RIGHT LEG  Final   Special Requests NONE  Final   Gram Stain   Final    FEW WBC PRESENT, PREDOMINANTLY PMN RARE GRAM POSITIVE COCCI IN PAIRS Performed at [facility], [address] [place], [place] [address]    Culture   Final    MODERATE METHICILLIN RESISTANT STAPHYLOCOCCUS AUREUS MODERATE GROUP B STREP(S.AGALACTIAE)ISOLATED TESTING AGAINST S. AGALACTIAE NOT ROUTINELY PERFORMED DUE TO PREDICTABILITY OF AMP/PEN/VAN SUSCEPTIBILITY. NO ANAEROBES ISOLATED; CULTURE IN PROGRESS FOR 5 DAYS    Report Status PENDING  Incomplete   Organism ID, Bacteria METHICILLIN RESISTANT STAPHYLOCOCCUS AUREUS  Final      Susceptibility   Methicillin resistant staphylococcus aureus - MIC*    CIPROFLOXACIN  <=0.5 SENSITIVE Sensitive     ERYTHROMYCIN  <=0.25 SENSITIVE Sensitive     GENTAMICIN  <=0.5 SENSITIVE Sensitive     OXACILLIN >=4 RESISTANT Resistant     TETRACYCLINE <=1 SENSITIVE Sensitive     VANCOMYCIN  1 SENSITIVE Sensitive     TRIMETH /SULFA  <=10 SENSITIVE Sensitive     CLINDAMYCIN  <=0.25 SENSITIVE Sensitive     RIFAMPIN <=0.5 SENSITIVE Sensitive     Inducible Clindamycin  NEGATIVE Sensitive     LINEZOLID  2 SENSITIVE Sensitive     * MODERATE METHICILLIN RESISTANT STAPHYLOCOCCUS AUREUS    Radiology Studies: No results found.  Scheduled Meds:  amoxicillin -clavulanate  1 tablet Oral TID   influenza vac split trivalent PF  0.5 mL Intramuscular Tomorrow-1000   insulin  aspart  0-5 Units Subcutaneous QHS   insulin  aspart  0-6 Units Subcutaneous TID WC   insulin  glargine  45 Units Subcutaneous Daily   linezolid   600 mg Oral Q12H   sodium chloride  flush  3 mL Intravenous Q12H   Continuous Infusions:  lactated ringers  100 mL/hr at 03/18/24 1452     LOS: 5 days   35 minutes with more than 50% spent in reviewing records, counseling  patient/family and coordinating care.  Reyes VEAR Gaw, MD [facility] [email or web address] 03/21/2024, 10:42 AM

## 2024-03-21 NOTE — Plan of Care (Signed)
  Problem: Education: Goal: Ability to describe self-care measures that may prevent or decrease complications (Diabetes Survival Skills Education) will improve Outcome: Progressing Goal: Individualized Educational Video(s) Outcome: Progressing   Problem: Coping: Goal: Ability to adjust to condition or change in health will improve Outcome: Progressing   Problem: Health Behavior/Discharge Planning: Goal: Ability to identify and utilize available resources and services will improve Outcome: Progressing Goal: Ability to manage health-related needs will improve Outcome: Progressing   Problem: Nutritional: Goal: Maintenance of adequate nutrition will improve Outcome: Progressing Goal: Progress toward achieving an optimal weight will improve Outcome: Progressing   Problem: Tissue Perfusion: Goal: Adequacy of tissue perfusion will improve Outcome: Progressing   Problem: Clinical Measurements: Goal: Ability to maintain clinical measurements within normal limits will improve Outcome: Progressing Goal: Will remain free from infection Outcome: Progressing Goal: Diagnostic test results will improve Outcome: Progressing Goal: Respiratory complications will improve Outcome: Progressing Goal: Cardiovascular complication will be avoided Outcome: Progressing

## 2024-03-21 NOTE — Progress Notes (Signed)
 Patient ID: Zachary Schneider, male   DOB: [birth year], 53 y.o.   MRN: [id number] Patient is postoperative day 1 repeat debridement right leg infection.  There is 75 cc in the wound VAC canister.  Wound cultures are showing group B strep.  Patient will need antibiotic coverage as discharge and may discharge from an orthopedic standpoint when antibiotics are established.  Will discharge with the Prevena plus portable wound VAC pump.

## 2024-03-22 LAB — BASIC METABOLIC PANEL WITH GFR
Anion gap: 9 (ref 5–15)
BUN: <5 mg/dL — ABNORMAL LOW (ref 6–20)
CO2: 27 mmol/L (ref 22–32)
Calcium: 7.7 mg/dL — ABNORMAL LOW (ref 8.9–10.3)
Chloride: 97 mmol/L — ABNORMAL LOW (ref 98–111)
Creatinine, Ser: 0.53 mg/dL — ABNORMAL LOW (ref 0.61–1.24)
GFR, Estimated: >60 mL/min (ref >60)
Glucose, Bld: 171 mg/dL — ABNORMAL HIGH (ref 70–99)
Potassium: 3.3 mmol/L — ABNORMAL LOW (ref 3.5–5.1)
Sodium: 133 mmol/L — ABNORMAL LOW (ref 135–145)

## 2024-03-22 LAB — CBC
HCT: 28.6 % — ABNORMAL LOW (ref 39.0–52.0)
Hemoglobin: 9.4 g/dL — ABNORMAL LOW (ref 13.0–17.0)
MCH: 27.1 pg (ref 26.0–34.0)
MCHC: 32.9 g/dL (ref 30.0–36.0)
MCV: 82.4 fL (ref 80.0–100.0)
Platelets: 315 K/uL (ref 150–400)
RBC: 3.47 MIL/uL — ABNORMAL LOW (ref 4.22–5.81)
RDW: 14.4 % (ref 11.5–15.5)
WBC: 12.2 K/uL — ABNORMAL HIGH (ref 4.0–10.5)
nRBC: 0.2 % (ref 0.0–0.2)

## 2024-03-22 LAB — CULTURE, BLOOD (ROUTINE X 2): Culture: NO GROWTH

## 2024-03-22 LAB — GLUCOSE, CAPILLARY
Glucose-Capillary: 200 mg/dL — ABNORMAL HIGH (ref 70–99)
Glucose-Capillary: 189 mg/dL — ABNORMAL HIGH (ref 70–99)
Glucose-Capillary: 192 mg/dL — ABNORMAL HIGH (ref 70–99)
Glucose-Capillary: 164 mg/dL — ABNORMAL HIGH (ref 70–99)

## 2024-03-22 MED ORDER — AMOXICILLIN-POT CLAVULANATE 875-125 MG PO TABS
1.0000 | ORAL_TABLET | Freq: Two times a day (BID) | ORAL | Status: DC
Start: 2024-03-22 — End: 2024-03-29
  Administered 2024-03-22 – 2024-03-23 (×3): 1 via ORAL
  Filled 2024-03-22 (×3): qty 1

## 2024-03-22 MED ORDER — POTASSIUM CHLORIDE CRYS ER 20 MEQ PO TBCR
40.0000 meq | EXTENDED_RELEASE_TABLET | Freq: Once | ORAL | Status: AC
  Administered 2024-03-22: 40 meq via ORAL
  Filled 2024-03-22: qty 2

## 2024-03-22 NOTE — Evaluation (Signed)
 Occupational Therapy Evaluation & Discharge Patient Details Name: Zachary Schneider MRN: [id number] DOB: 21-Oct-1970 Today's Date: 03/22/2024   History of Present Illness   Pt is a 53 y.o. M who presents 03/16/2024 with RLE disseminated cellulitis/Tibialis posterior tenosynovitis. S/p multiple debridements. Significant PMH: testicular CA, poorly controlled type 2 diabetes mellitus.     Clinical Impressions Pt admitted based on above, and was seen based on problem list below. PTA pt was independent with ADLs and IADLs. Today pt is requiring set up  to CGA for ADLs. Functional transfers are  CGA with RW. Pt mostly CGA, but limited by pain and is effortful.  Educated pt on compensatory techniques for LB dressing, but would benefit from continued practice. Anticipate pt will progress well, no follow up OT or DME needs. OT will continue to follow acutely to maximize functional independence.        If plan is discharge home, recommend the following:   A little help with walking and/or transfers;A little help with bathing/dressing/bathroom;Help with stairs or ramp for entrance     Functional Status Assessment   Patient has had a recent decline in their functional status and demonstrates the ability to make significant improvements in function in a reasonable and predictable amount of time.     Equipment Recommendations   None recommended by OT      Precautions/Restrictions   Precautions Precautions: Fall Recall of Precautions/Restrictions: Intact Precaution/Restrictions Comments: Wound vac Restrictions Weight Bearing Restrictions Per Provider Order: No RLE Weight Bearing Per Provider Order: Weight bearing as tolerated     Mobility Bed Mobility Overal bed mobility: Modified Independent     General bed mobility comments: Increased time, HOB elevated    Transfers Overall transfer level: Needs assistance Equipment used: Rolling walker (2 wheels) Transfers: Sit to/from  Stand Sit to Stand: Contact guard assist   General transfer comment: Use of momentum, cueing for technique. Increased time and effort for hop ~54ft      Balance Overall balance assessment: Needs assistance Sitting-balance support: Feet supported Sitting balance-Leahy Scale: Fair     Standing balance support: Bilateral upper extremity supported Standing balance-Leahy Scale: Poor Standing balance comment: Reliant on RW         ADL either performed or assessed with clinical judgement   ADL Overall ADL's : Needs assistance/impaired Eating/Feeding: Set up;Sitting   Grooming: Set up;Sitting     Upper Body Dressing : Set up;Sitting   Lower Body Dressing: Contact guard assist;Sitting/lateral leans;Sit to/from stand Lower Body Dressing Details (indicate cue type and reason): Pt primarily completing lateral leans seated EOB to don pants Toilet Transfer: Contact guard assist;Rolling walker (2 wheels) Toilet Transfer Details (indicate cue type and reason): CGA hop pivot simulated in room Toileting- Clothing Manipulation and Hygiene: Contact guard assist;Sit to/from stand       Functional mobility during ADLs: Contact guard assist;Rolling walker (2 wheels) General ADL Comments: Pain limiting     Vision Baseline Vision/History: 0 No visual deficits Patient Visual Report: No change from baseline Vision Assessment?: No apparent visual deficits            Pertinent Vitals/Pain Pain Assessment Pain Assessment: 0-10 Faces Pain Scale: Hurts even more Pain Location: RLE Pain Descriptors / Indicators: Grimacing, Guarding Pain Intervention(s): Monitored during session, Patient requesting pain meds-RN notified     Extremity/Trunk Assessment Upper Extremity Assessment Upper Extremity Assessment: Overall WFL for tasks assessed   Lower Extremity Assessment Lower Extremity Assessment: Defer to PT evaluation   Cervical / Trunk  Assessment Cervical / Trunk Assessment: Normal    Communication Communication Communication: No apparent difficulties   Cognition Arousal: Alert Behavior During Therapy: WFL for tasks assessed/performed Cognition: No apparent impairments       Following commands: Intact       Cueing  General Comments   Cueing Techniques: Verbal cues  Wound vac intact           Home Living Family/patient expects to be discharged to:: Private residence Living Arrangements: Non-relatives/Friends Available Help at Discharge: Friend(s) Type of Home: House Home Access: Level entry     Home Layout: Two level;1/2 bath on main level     Bathroom Shower/Tub: Tub/shower unit Manufacturing systems engineer)   Bathroom Toilet: Standard Bathroom Accessibility: No   Home Equipment: None          Prior Functioning/Environment Prior Level of Function : Independent/Modified Independent             Mobility Comments: no AD ADLs Comments: working    OT Problem List: Decreased strength;Decreased range of motion;Impaired balance (sitting and/or standing);Decreased activity tolerance;Pain   OT Treatment/Interventions: Self-care/ADL training;Therapeutic exercise;Energy conservation;DME and/or AE instruction;Therapeutic activities;Balance training;Patient/family education      OT Goals(Current goals can be found in the care plan section)   Acute Rehab OT Goals Patient Stated Goal: To get better OT Goal Formulation: With patient Time For Goal Achievement: 04/05/24 Potential to Achieve Goals: Good   OT Frequency:  Min 2X/week       AM-PAC OT 6 Clicks Daily Activity     Outcome Measure Help from another person eating meals?: None Help from another person taking care of personal grooming?: A Little Help from another person toileting, which includes using toliet, bedpan, or urinal?: A Little Help from another person bathing (including washing, rinsing, drying)?: A Little Help from another person to put on and taking off regular upper body clothing?:  A Little Help from another person to put on and taking off regular lower body clothing?: A Little 6 Click Score: 19   End of Session Equipment Utilized During Treatment: Gait belt;Rolling walker (2 wheels) Nurse Communication: Mobility status  Activity Tolerance: Patient limited by pain Patient left: in bed;with call bell/phone within reach  OT Visit Diagnosis: Unsteadiness on feet (R26.81);Other abnormalities of gait and mobility (R26.89);Muscle weakness (generalized) (M62.81)                Time: 8672-8654 OT Time Calculation (min): 18 min Charges:  OT General Charges $OT Visit: 1 Visit OT Evaluation $OT Eval Moderate Complexity: 1 Mod  Marque Rademaker C, OT  Acute Rehabilitation Services Office [phone] Secure chat preferred   Adrianne GORMAN Savers 03/22/2024, 2:06 PM

## 2024-03-22 NOTE — Progress Notes (Signed)
 PROGRESS NOTE  Zachary Schneider  FMW:[id number] DOB: [birth year] DOA: 03/16/2024 PCP: Pcp, No  Consultants  Brief Narrative: 53 y.o. male with medical history significant for testicular cancer and poorly controlled type 2 diabetes mellitus who presents with worsening redness, swelling, and pain involving the lower right leg.  Recently seen on 9/11 for calf tenderness swelling and erythema initiated on doxycycline  with worsening symptoms concerning for failure of outpatient therapy.  Initially admitted at [facility], transferred to [facility] per orthopedics recommendation for surgical debridement.   Assessment & Plan: RLE disseminated cellulitis/Tibialis posterior tenosynovitis  - Patient did not meet sepsis criteria.  Started on cefazolin , cellulitis did not improve, transferred to [facility] and underwent excisional debridement right calf and right foot, Soft tissue sent for cultures which is growing gram-positive cocci-->moderate staph aureus/Group B strep.  Susceptibilities pending.   - Wound vac in place, leukocytosis downtrending. - Now status post surgical debridement times 08/11/2015, 919. - Switching to linezolid  plus Augmentin  for coverage of his MRSA/group B strep grown from cultures. - able to get out of bed with PT yesterday.  Hasn't seen OT yet.  Feels his strength is slowly returning, but nowhere near baseline yet.     Type II DM, uncontrolled with hyperglycemia/history of noncompliance to medications: - A1c 12.3, nonadherent at home with insulin  and metformin .   - CBG still elevated here. - Increasing his Lantus  to 45 units today. - Continue SSI    Acute anemia:  - Hemoglobin was within normal range few days ago, trending downwards but now stable at 9's.  No history of hematemesis, hematochezia or melena.  Will check iron studies, B12, folate and FOBT.  Monitor closely.  Transfuse if less than 7.   Hypokalemia:  - Resolved after replacement.   Class II morbid obesity:  - BMI  almost 35.  Weight loss and diet modification counseled.  Deconditioning: - Patient has not been out of bed for the past several days.  Consulted PT OT today for recommendations at discharge for continued therapies. - PT saw patient, helped with transitions, etc - OT eval pending today - question is whether he would qualify for home health PT/OT or need it - plan for today is to continue strengthening, out of bed, etc today.  Ok to shower.  If does well and OT sees today, can likely go home tomorrow.   Hyponatremia: - Borderline low sodium since at least March of this year. - Sodium 133, within his range of this year. - Will continue to trend.  Patient asymptomatic.  Hypocalcemia: - Last albumin was 9/15.  Would not be surprised if low.  Unclear at this point if this is pseudo hypocalcemia - Will check ionized calcium  and replenish as low.     DVT prophylaxis:  holding heparin  x 3 days due to high risk of bleeding per Ortho rec's  Code Status:   Code Status: Full Code Level of care: Med-Surg Status is: Inpatient Dispo: Pending PT evaluation Consults called: Orthopedics   Subjective: Pt seen this AM, girlfriend in room.  Awake and alert, able to get out of bed, transfer to commode yesterday for first time.  Some pain in leg, but otherwise no complaints, doing well.    Objective: Vitals:   03/21/24 0354 03/21/24 1606 03/21/24 1900 03/22/24 0921  BP: 117/73 (!) 141/79 135/78 134/87  Pulse: 68 74 79 69  Resp: 16 18 18 18   Temp: 98.9 F (37.2 C) 98.2 F (36.8 C) 99.7 F (37.6  C) 98.2 F (36.8 C)  TempSrc: Oral Oral Oral   SpO2: 98% 95% 91% 94%  Weight:      Height:        Intake/Output Summary (Last 24 hours) at 03/22/2024 1113 Last data filed at 03/22/2024 0942 Gross per 24 hour  Intake 310 ml  Output 4000 ml  Net -3690 ml   Filed Weights   03/18/24 1249 03/18/24 1732 03/20/24 0909  Weight: 113.4 kg 119.9 kg 113.4 kg   Body mass index is 32.98 kg/m.  Gen: 53  y.o. male in no apparent distress.  Nontoxic Pulm: Non-labored breathing.  Clear to auscultation bilaterally.  CV: Regular rate and rhythm. No murmur, rub, or gallop. No JVD GI: Abdomen soft, non-tender, non-distended Ext: Warm.  LLE WNL, Right LE with bandage in place and wound vac with some blood in tubing/vac.   Skin: No rashes, lesions  Neuro: Alert and oriented. No focal neurological deficits. Psych: Calm  Judgement and insight appear normal. Mood & affect appropriate.    I have personally reviewed the following labs and images: CBC: Recent Labs  Lab 03/16/24 1657 03/17/24 0052 03/18/24 0341 03/19/24 0455 03/20/24 0402 03/21/24 0802 03/22/24 0308  WBC 13.9*   < > 12.7* 12.9* 10.1 11.9* 12.2*  NEUTROABS 9.9*  --   --   --   --   --   --   HGB 13.5   < > 11.1* 9.9* 9.9* 9.4* 9.4*  HCT 42.7   < > 35.0* 30.2* 30.2* 28.9* 28.6*  MCV 83.6   < > 85.4 83.9 83.7 84.3 82.4  PLT 309   < > 272 289 285 280 315   < > = values in this interval not displayed.   BMP &GFR Recent Labs  Lab 03/18/24 0341 03/19/24 0455 03/20/24 0402 03/21/24 0802 03/22/24 0308  NA 136 132* 136 133* 133*  K 3.6 3.3* 3.8 3.8 3.3*  CL 101 96* 100 98 97*  CO2 24 25 22 25 27   GLUCOSE 268* 255* 206* 170* 171*  BUN 6 7 5* <5* <5*  CREATININE 0.53* 0.64 0.42* 0.56* 0.53*  CALCIUM  8.2* 7.6* 8.1* 7.7* 7.7*   Estimated Creatinine Clearance: 140.9 mL/min (A) (by C-G formula based on SCr of 0.53 mg/dL (L)). Liver & Pancreas: Recent Labs  Lab 03/16/24 1657  AST 13*  ALT 12  ALKPHOS 91  BILITOT 0.6  PROT 7.0  ALBUMIN 3.3*   No results for input(s): LIPASE, AMYLASE in the last 168 hours. No results for input(s): AMMONIA in the last 168 hours. Diabetic: No results for input(s): HGBA1C in the last 72 hours. Recent Labs  Lab 03/21/24 0827 03/21/24 1134 03/21/24 1605 03/21/24 2142 03/22/24 0920  GLUCAP 168* 176* 179* 218* 200*   Cardiac Enzymes: No results for input(s): CKTOTAL, CKMB,  CKMBINDEX, TROPONINI in the last 168 hours. No results for input(s): PROBNP in the last 8760 hours. Coagulation Profile: Recent Labs  Lab 03/16/24 1657  INR 1.2   Thyroid Function Tests: No results for input(s): TSH, T4TOTAL, FREET4, T3FREE, THYROIDAB in the last 72 hours. Lipid Profile: No results for input(s): CHOL, HDL, LDLCALC, TRIG, CHOLHDL, LDLDIRECT in the last 72 hours. Anemia Panel: No results for input(s): VITAMINB12, FOLATE, FERRITIN, TIBC, IRON, RETICCTPCT in the last 72 hours.  Urine analysis:    Component Value Date/Time   COLORURINE STRAW (A) 08/31/2023 1732   APPEARANCEUR CLEAR 08/31/2023 1732   LABSPEC 1.029 08/31/2023 1732   PHURINE 6.0 08/31/2023 1732   GLUCOSEU >=  500 (A) 08/31/2023 1732   HGBUR MODERATE (A) 08/31/2023 1732   BILIRUBINUR NEGATIVE 08/31/2023 1732   KETONESUR NEGATIVE 08/31/2023 1732   PROTEINUR NEGATIVE 08/31/2023 1732   NITRITE NEGATIVE 08/31/2023 1732   LEUKOCYTESUR NEGATIVE 08/31/2023 1732   Sepsis Labs: Invalid input(s): PROCALCITONIN, LACTICIDVEN  Microbiology: Recent Results (from the past 240 hours)  Blood Culture (routine x 2)     Status: None   Collection Time: 03/16/24  4:57 PM   Specimen: BLOOD RIGHT ARM  Result Value Ref Range Status   Specimen Description   Final    BLOOD RIGHT ARM Performed at [facility], [address], [place], [place] [address]    Special Requests   Final    BOTTLES DRAWN AEROBIC AND ANAEROBIC Blood Culture adequate volume Performed at [facility], [address] [place], [place] [address]    Culture   Final    NO GROWTH 5 DAYS Performed at [facility], [address] [place], [place] [address]    Report Status 03/21/2024 FINAL  Final  Blood Culture (routine x 2)     Status: None   Collection Time: 03/16/24 10:18 PM   Specimen: BLOOD  Result Value Ref Range Status   Specimen Description   Final    BLOOD RIGHT  ANTECUBITAL Performed at [facility], [address] [place], [place] [address]    Special Requests   Final    Blood Culture results may not be optimal due to an inadequate volume of blood received in culture bottles BOTTLES DRAWN AEROBIC AND ANAEROBIC Performed at [facility], [address] [place], [place] [address]    Culture   Final    NO GROWTH 5 DAYS Performed at [facility], [address], [place], [place] [address]    Report Status 03/22/2024 FINAL  Final  Aerobic/Anaerobic Culture w Gram Stain (surgical/deep wound)     Status: None (Preliminary result)   Collection Time: 03/18/24  3:08 PM   Specimen: Leg, Right; Tissue  Result Value Ref Range Status   Specimen Description TISSUE RIGHT LEG  Final   Special Requests NONE  Final   Gram Stain   Final    FEW WBC PRESENT, PREDOMINANTLY PMN RARE GRAM POSITIVE COCCI IN PAIRS Performed at [facility], [address] [place], [place] [address]    Culture   Final    MODERATE METHICILLIN RESISTANT STAPHYLOCOCCUS AUREUS MODERATE GROUP B STREP(S.AGALACTIAE)ISOLATED TESTING AGAINST S. AGALACTIAE NOT ROUTINELY PERFORMED DUE TO PREDICTABILITY OF AMP/PEN/VAN SUSCEPTIBILITY. NO ANAEROBES ISOLATED; CULTURE IN PROGRESS FOR 5 DAYS    Report Status PENDING  Incomplete   Organism ID, Bacteria METHICILLIN RESISTANT STAPHYLOCOCCUS AUREUS  Final      Susceptibility   Methicillin resistant staphylococcus aureus - MIC*    CIPROFLOXACIN  <=0.5 SENSITIVE Sensitive     ERYTHROMYCIN  <=0.25 SENSITIVE Sensitive     GENTAMICIN  <=0.5 SENSITIVE Sensitive     OXACILLIN >=4 RESISTANT Resistant     TETRACYCLINE <=1 SENSITIVE Sensitive     VANCOMYCIN  1 SENSITIVE Sensitive     TRIMETH /SULFA  <=10 SENSITIVE Sensitive     CLINDAMYCIN  <=0.25 SENSITIVE Sensitive     RIFAMPIN <=0.5 SENSITIVE Sensitive     Inducible Clindamycin  NEGATIVE Sensitive     LINEZOLID  2 SENSITIVE Sensitive     * MODERATE  METHICILLIN RESISTANT STAPHYLOCOCCUS AUREUS    Radiology Studies: No results found.  Scheduled Meds:  amoxicillin -clavulanate  1 tablet Oral Q12H  influenza vac split trivalent PF  0.5 mL Intramuscular Tomorrow-1000   insulin  aspart  0-5 Units Subcutaneous QHS   insulin  aspart  0-6 Units Subcutaneous TID WC   insulin  glargine  45 Units Subcutaneous Daily   linezolid   600 mg Oral Q12H   sodium chloride  flush  3 mL Intravenous Q12H   Continuous Infusions:  lactated ringers  100 mL/hr at 03/18/24 1452     LOS: 6 days   35 minutes with more than 50% spent in reviewing records, counseling patient/family and coordinating care.  Reyes VEAR Gaw, MD [facility] [email or web address] 03/22/2024, 11:13 AM

## 2024-03-22 NOTE — Plan of Care (Signed)
  Problem: Coping: Goal: Ability to adjust to condition or change in health will improve 03/22/2024 0034 by Jackquline Leonette CROME, RN Outcome: Progressing 03/22/2024 0034 by Jackquline Leonette CROME, RN Outcome: Progressing   Problem: Nutritional: Goal: Maintenance of adequate nutrition will improve 03/22/2024 0034 by Jackquline Leonette CROME, RN Outcome: Progressing 03/22/2024 0034 by Jackquline Leonette CROME, RN Outcome: Progressing Goal: Progress toward achieving an optimal weight will improve 03/22/2024 0034 by Jackquline Leonette CROME, RN Outcome: Progressing 03/22/2024 0034 by Jackquline Leonette CROME, RN Outcome: Progressing   Problem: Education: Goal: Knowledge of General Education information will improve Description: Including pain rating scale, medication(s)/side effects and non-pharmacologic comfort measures 03/22/2024 0034 by Jackquline Leonette CROME, RN Outcome: Progressing 03/22/2024 0034 by Jackquline Leonette CROME, RN Outcome: Progressing   Problem: Clinical Measurements: Goal: Ability to maintain clinical measurements within normal limits will improve 03/22/2024 0034 by Jackquline Leonette CROME, RN Outcome: Progressing 03/22/2024 0034 by Jackquline Leonette CROME, RN Outcome: Progressing Goal: Will remain free from infection 03/22/2024 0034 by Jackquline Leonette CROME, RN Outcome: Progressing 03/22/2024 0034 by Jackquline Leonette CROME, RN Outcome: Progressing Goal: Diagnostic test results will improve 03/22/2024 0034 by Jackquline Leonette CROME, RN Outcome: Progressing 03/22/2024 0034 by Jackquline Leonette CROME, RN Outcome: Progressing Goal: Respiratory complications will improve 03/22/2024 0034 by Jackquline Leonette CROME, RN Outcome: Progressing 03/22/2024 0034 by Jackquline Leonette CROME, RN Outcome: Progressing Goal: Cardiovascular complication will be avoided 03/22/2024 0034 by Jackquline Leonette CROME, RN Outcome: Progressing 03/22/2024 0034 by Jackquline Leonette CROME, RN Outcome: Progressing

## 2024-03-22 NOTE — Plan of Care (Signed)
 Pt tried to pivot to bsc on his own. Called nursing station and said he almost fell but caught himself.  Bhe stated he did not fall or hit his head. bed alarm on. Pt education provided. Wife was on phone also and encouraged him to call us  first.    Problem: Health Behavior/Discharge Planning: Goal: Ability to identify and utilize available resources and services will improve Outcome: Progressing Goal: Ability to manage health-related needs will improve Outcome: Progressing   Problem: Coping: Goal: Ability to adjust to condition or change in health will improve Outcome: Progressing   Problem: Education: Goal: Ability to describe self-care measures that may prevent or decrease complications (Diabetes Survival Skills Education) will improve Outcome: Progressing Goal: Individualized Educational Video(s) Outcome: Progressing   Problem: Metabolic: Goal: Ability to maintain appropriate glucose levels will improve Outcome: Progressing   Problem: Nutritional: Goal: Maintenance of adequate nutrition will improve Outcome: Progressing Goal: Progress toward achieving an optimal weight will improve Outcome: Progressing   Problem: Tissue Perfusion: Goal: Adequacy of tissue perfusion will improve Outcome: Progressing   Problem: Education: Goal: Knowledge of General Education information will improve Description: Including pain rating scale, medication(s)/side effects and non-pharmacologic comfort measures Outcome: Progressing

## 2024-03-23 ENCOUNTER — Encounter (HOSPITAL_COMMUNITY): Payer: Self-pay | Admitting: Orthopedic Surgery

## 2024-03-23 ENCOUNTER — Encounter (HOSPITAL_COMMUNITY): Payer: Self-pay

## 2024-03-23 LAB — BASIC METABOLIC PANEL WITH GFR
Anion gap: 11 (ref 5–15)
BUN: 5 mg/dL — ABNORMAL LOW (ref 6–20)
CO2: 27 mmol/L (ref 22–32)
Calcium: 8 mg/dL — ABNORMAL LOW (ref 8.9–10.3)
Chloride: 96 mmol/L — ABNORMAL LOW (ref 98–111)
Creatinine, Ser: 0.6 mg/dL — ABNORMAL LOW (ref 0.61–1.24)
GFR, Estimated: >60 mL/min (ref >60)
Glucose, Bld: 198 mg/dL — ABNORMAL HIGH (ref 70–99)
Potassium: 3.5 mmol/L (ref 3.5–5.1)
Sodium: 134 mmol/L — ABNORMAL LOW (ref 135–145)

## 2024-03-23 LAB — AEROBIC/ANAEROBIC CULTURE W GRAM STAIN (SURGICAL/DEEP WOUND)

## 2024-03-23 LAB — GLUCOSE, CAPILLARY
Glucose-Capillary: 187 mg/dL — ABNORMAL HIGH (ref 70–99)
Glucose-Capillary: 169 mg/dL — ABNORMAL HIGH (ref 70–99)
Glucose-Capillary: 162 mg/dL — ABNORMAL HIGH (ref 70–99)

## 2024-03-23 LAB — CBC
HCT: 32 % — ABNORMAL LOW (ref 39.0–52.0)
Hemoglobin: 10.3 g/dL — ABNORMAL LOW (ref 13.0–17.0)
MCH: 26.9 pg (ref 26.0–34.0)
MCHC: 32.2 g/dL (ref 30.0–36.0)
MCV: 83.6 fL (ref 80.0–100.0)
Platelets: 395 K/uL (ref 150–400)
RBC: 3.83 MIL/uL — ABNORMAL LOW (ref 4.22–5.81)
RDW: 14.4 % (ref 11.5–15.5)
WBC: 13.4 K/uL — ABNORMAL HIGH (ref 4.0–10.5)
nRBC: 0 % (ref 0.0–0.2)

## 2024-03-23 MED ORDER — AMOXICILLIN-POT CLAVULANATE 875-125 MG PO TABS: 1.0000 | ORAL_TABLET | Freq: Two times a day (BID) | ORAL | 0 refills | Status: AC

## 2024-03-23 MED ORDER — OXYCODONE HCL 5 MG PO TABS: 5.0000 mg | ORAL_TABLET | ORAL | 0 refills | Status: DC | PRN

## 2024-03-23 MED ORDER — LINEZOLID 600 MG PO TABS: 600.0000 mg | ORAL_TABLET | Freq: Two times a day (BID) | ORAL | 0 refills | Status: AC

## 2024-03-23 NOTE — Progress Notes (Signed)
 Discharge Nurse Summary: DC order noted per MD. DC RN at bedside with patient. Patient agreeable with discharge plan, wife present for pickup.  AVS printed/reviewed. PIV removed, skin intact. No DME needs. No home/TOC meds. CP/Edu resolved. Telemonitor not present on assessment. All belongings accounted for including prevena wound vac. Wound vac exchange completed successfully with demonstration of charge instruction and s/s to report to MD. Dressing to wound, CDI w/o bleeding or drainage. See LDAs. Patient wheeled downstairs for discharge by private auto.   Rosario EMERSON Lund, RN

## 2024-03-23 NOTE — Progress Notes (Signed)
 Physical Therapy Treatment Patient Details Name: Zachary Schneider MRN: [id number] DOB: [birth year] Today's Date: 03/23/2024   History of Present Illness Pt is a 53 y.o. M who presents 03/16/2024 with RLE disseminated cellulitis/Tibialis posterior tenosynovitis. S/p multiple debridements. Significant PMH: testicular CA, poorly controlled type 2 diabetes mellitus.    PT Comments  Pt seen for PT tx with pt received in bed, girlfriend Stoney) present throughout session. Pt with limited conversation/interaction during session but [name] asking appropriate questions. Pt with decreased awareness of hand placement during transfers, ambulates to bathroom with RW & CGA, maintaining NWB throughout. Educated [name] on DME recommendations, provided them with HEP handout per request.  Will continue to follow pt acutely to progress mobility as able.   If plan is discharge home, recommend the following: A little help with walking and/or transfers;A little help with bathing/dressing/bathroom;Assistance with cooking/housework;Assist for transportation;Help with stairs or ramp for entrance   Can travel by private vehicle        Equipment Recommendations  Rolling walker (2 wheels);BSC/3in1;Wheelchair (measurements PT);Wheelchair cushion (measurements PT)    Recommendations for Other Services       Precautions / Restrictions Precautions Precautions: Fall Precaution/Restrictions Comments: RLE Wound vac Restrictions Weight Bearing Restrictions Per Provider Order: Yes RLE Weight Bearing Per Provider Order: Weight bearing as tolerated     Mobility  Bed Mobility Overal bed mobility: Modified Independent Bed Mobility: Supine to Sit     Supine to sit: Modified independent (Device/Increase time), HOB elevated, Used rails (exit L side of bed)          Transfers Overall transfer level: Needs assistance Equipment used: Rolling walker (2 wheels) Transfers: Sit to/from Stand Sit to Stand: Supervision            General transfer comment: Education re: hand placement to push to standing with pt verbalizing understanding but then moving BUE back to RW vs pushing to stand, using momentum to assist with powering up.    Ambulation/Gait Ambulation/Gait assistance: Contact guard assist Gait Distance (Feet): 12 Feet Assistive device: Rolling walker (2 wheels) Gait Pattern/deviations: Decreased step length - left, Step-to pattern, Decreased stride length Gait velocity: decreased     General Gait Details: Pt maintains NWB RLE throughout gait with fair balance, PT providing cuing for ability to transition to WBAT RLE.   Stairs             Wheelchair Mobility     Tilt Bed    Modified Rankin (Stroke Patients Only)       Balance Overall balance assessment: Needs assistance Sitting-balance support: Feet supported Sitting balance-Leahy Scale: Good     Standing balance support: During functional activity, Bilateral upper extremity supported, Reliant on assistive device for balance Standing balance-Leahy Scale: Fair                              Hotel manager: No apparent difficulties  Cognition Arousal: Alert Behavior During Therapy: Flat affect   PT - Cognitive impairments: No apparent impairments                       PT - Cognition Comments: Pt with minimal conversation with PT, reports frustration re: MRSA diagnosis, not very receptive of PT efforts Following commands: Intact      Cueing Cueing Techniques: Verbal cues  Exercises      General Comments General comments (skin integrity, edema, etc.): educated girlfriend on how pt  should negotiate step into shower.      Pertinent Vitals/Pain Pain Assessment Pain Assessment: 0-10 Pain Score: 2  Pain Location: RLE Pain Descriptors / Indicators: Grimacing, Guarding Pain Intervention(s): Monitored during session    Home Living                           Prior Function            PT Goals (current goals can now be found in the care plan section) Acute Rehab PT Goals Patient Stated Goal: less pain PT Goal Formulation: With patient Time For Goal Achievement: 04/04/24 Potential to Achieve Goals: Good Progress towards PT goals: Progressing toward goals    Frequency    Min 2X/week      PT Plan      Co-evaluation              AM-PAC PT 6 Clicks Mobility   Outcome Measure  Help needed turning from your back to your side while in a flat bed without using bedrails?: None Help needed moving from lying on your back to sitting on the side of a flat bed without using bedrails?: A Little Help needed moving to and from a bed to a chair (including a wheelchair)?: A Little Help needed standing up from a chair using your arms (e.g., wheelchair or bedside chair)?: A Little Help needed to walk in hospital room?: A Little Help needed climbing 3-5 steps with a railing? : A Lot 6 Click Score: 18    End of Session   Activity Tolerance:  (limited by pt wanting to take a shower) Patient left:  (on toilet, girlfriend assisting, nurse aware)   PT Visit Diagnosis: Difficulty in walking, not elsewhere classified (R26.2);Pain;Other abnormalities of gait and mobility (R26.89) Pain - Right/Left: Right Pain - part of body: Leg     Time: 8862-8852 PT Time Calculation (min) (ACUTE ONLY): 10 min  Charges:    $Therapeutic Activity: 8-22 mins PT General Charges $$ ACUTE PT VISIT: 1 Visit                     Richerd Pinal, PT, DPT 03/23/24, 12:34 PM   Richerd CHRISTELLA Pinal 03/23/2024, 12:33 PM

## 2024-03-23 NOTE — TOC Progression Note (Addendum)
 Transition of Care [facility]) - Progression Note    Patient Details  Name: Zachary Schneider MRN: [id number] Date of Birth: 02-20-1971  Transition of Care [facility]) CM/SW Contact  Roxie KANDICE Stain, RN Phone Number: 03/23/2024, 1:35 PM  Clinical Narrative:    Unable to secure home health RN, PT due to no insurance.  Patient agreeable to go to OP PT @ [facility]. Referral sent. Notified Patient he will need to follow up with Dr. Harden for wound vac change.  Dr. Elpidio aware.  ICM (Inpatient Care Management) will continue to follow.     Expected Discharge Plan: OP Rehab Barriers to Discharge: Barriers Resolved               Expected Discharge Plan and Services   Discharge Planning Services: CM Consult Post Acute Care Choice: Durable Medical Equipment Living arrangements for the past 2 months: Single Family Home                 DME Arranged: Walker rolling DME Agency: [employer] Date DME Agency Contacted: 03/23/24 Time DME Agency Contacted: [phone] Representative spoke with at DME Agency: Zack             Social Drivers of Health (SDOH) Interventions SDOH Screenings   Food Insecurity: No Food Insecurity (03/16/2024)  Housing: Low Risk  (03/16/2024)  Transportation Needs: No Transportation Needs (03/16/2024)  Utilities: Not At Risk (03/16/2024)  Social Connections: Moderately Isolated (03/16/2024)  Tobacco Use: High Risk (03/20/2024)    Readmission Risk Interventions     No data to display

## 2024-03-23 NOTE — TOC Transition Note (Signed)
 Transition of Care [facility]) - Discharge Note   Patient Details  Name: Zachary Schneider MRN: [id number] Date of Birth: 1971-05-10  Transition of Care [facility]) CM/SW Contact:  Roxie KANDICE Stain, RN Phone Number: 03/23/2024, 3:02 PM   Clinical Narrative:    Patient stable for discharge.  [name], girl friend, found walker at [employer] therefore he doesn't need one from [employer].  Patient has PCP apt on AVS.    Final next level of care: OP Rehab Barriers to Discharge: Barriers Resolved   Patient Goals and CMS Choice Patient states their goals for this hospitalization and ongoing recovery are:: stay with [facility] CMS Medicare.gov Compare Post Acute Care list provided to:: Patient Choice offered to / list presented to : Patient      Discharge Placement               home        Discharge Plan and Services Additional resources added to the After Visit Summary for     Discharge Planning Services: CM Consult Post Acute Care Choice: Durable Medical Equipment          DME Arranged: Vannie rolling DME Agency: [employer] Date DME Agency Contacted: 03/23/24 Time DME Agency Contacted: [phone] Representative spoke with at DME Agency: Zack            Social Drivers of Health (SDOH) Interventions SDOH Screenings   Food Insecurity: No Food Insecurity (03/16/2024)  Housing: Low Risk  (03/16/2024)  Transportation Needs: No Transportation Needs (03/16/2024)  Utilities: Not At Risk (03/16/2024)  Social Connections: Moderately Isolated (03/16/2024)  Tobacco Use: High Risk (03/20/2024)     Readmission Risk Interventions    03/23/2024    2:59 PM  Readmission Risk Prevention Plan  Post Dischage Appt Complete  Medication Screening Complete  Transportation Screening Complete

## 2024-03-23 NOTE — Discharge Summary (Signed)
 Physician Discharge Summary   Patient: Zachary Schneider MRN: [id number] DOB: 31-Oct-1970  Admit date:     03/16/2024  Discharge date: 03/23/24  Discharge Physician: Reyes VEAR Gaw   PCP: Pcp, No   Recommendations at discharge:   Recommended follow-up with Dr. Harden within 1 week for recommendations for managing his wound VAC. He was discharged home on 7 further days of linezolid  plus Augmentin . Recommended to have repeat CBC once wound VAC removed and cellulitis completely resolved to ensure that his leukocytosis is also resolved.  Discharge Diagnoses: Principal Problem:   Cellulitis of right lower extremity Active Problems:   Uncontrolled type 2 diabetes mellitus with hyperglycemia (HCC)   Abscess of left lower leg  Resolved Problems:   * No resolved hospital problems. *  Hospital Course: 53 y.o. male with medical history significant for testicular cancer and poorly controlled type 2 diabetes mellitus who presents with worsening redness, swelling, and pain involving the lower right leg.  Recently seen on 9/11 for calf tenderness swelling and erythema initiated on doxycycline  with worsening symptoms concerning for failure of outpatient therapy.  Initially admitted at [facility], transferred to [facility] per orthopedics recommendation for surgical debridement.    Patient underwent surgical debridement on 9/17.  He was taken back for repeat debridement on 9/19.  Wound VAC was placed and he has tolerated this well while in house.  He is also being treated for surrounding cellulitis and tenosynovitis with linezolid  and Augmentin .  Did well with both of these and continue to improve.  Patient was stable to discharge home on Monday but we did were not able to get everything arranged from wound VAC/home health standpoint and therefore he was with us  through today 9/22/08/2023.  Due to the degree of improvement he was able to be discharged on 03/23/2024 with recommendations to follow-up with  orthopedist office for further management of his wound VAC.     Assessment & Plan: RLE disseminated cellulitis/Tibialis posterior tenosynovitis  - Patient did not meet sepsis criteria.  Started on cefazolin , cellulitis did not improve, transferred to [facility] and underwent excisional debridement right calf and right foot, Soft tissue sent for cultures which is growing gram-positive cocci-->moderate staph aureus/Group B strep.  Susceptibilities pending.   - Wound vac in place, leukocytosis downtrending. - Now status post surgical debridement times 08/11/2015, 919. - Switching to linezolid  plus Augmentin  for coverage of his MRSA/group B strep grown from cultures. - able to get out of bed with PT yesterday.  Did not qualify for home health PT/OT. -Per case management the type of wound VAC he has will not need changing until he sees his orthopedist in the next week.   Type II DM, uncontrolled with hyperglycemia/history of noncompliance to medications: - A1c 12.3, nonadherent at home with insulin  and metformin .   - CBG still elevated here. - Increasing his Lantus  to 45 units today. - Continue SSI    Acute anemia:  - Hemoglobin was within normal range few days ago, trending downwards but now stable at 9's.  No history of hematemesis, hematochezia or melena.  Will check iron studies, B12, folate and FOBT.  Monitor closely.  Transfuse if less than 7. -Remained stable for last several days prior to discharge.   Hypokalemia:  - Resolved after replacement.   Class II morbid obesity:  - BMI almost 35.  Weight loss and diet modification counseled.   Deconditioning: - Patient has not been out of bed for the past several days.  Consulted PT OT today for recommendations at discharge for continued therapies. - PT saw patient, helped with transitions, etc - OT eval pending today - question is whether he would qualify for home health PT/OT or need it - plan for today is to continue strengthening, out of  bed, etc today.  Ok to shower.  If does well and OT sees today, can likely go home tomorrow.    Hyponatremia: - Borderline low sodium since at least March of this year. - Sodium 133, within his range of this year. - Will continue to trend.  Patient asymptomatic.  Leukocytosis: - Should have follow-up CBC outpatient once cellulitis is complete resolved to ensure this is also resolved.      Consultants: Orthopedic surgery Procedures performed: Surgical debridement x 2 Disposition: Home Diet recommendation:  Discharge Diet Orders (From admission, onward)     Start     Ordered   03/23/24 0000  Diet - low sodium heart healthy        03/23/24 1423           Regular diet DISCHARGE MEDICATION: Allergies as of 03/23/2024       Reactions   Bee Venom Anaphylaxis, Swelling   [place] Root (wolfiporia Cocos) Diarrhea, Nausea And Vomiting   Mushroom Diarrhea   Mushroom Extract Complex (obsolete) Nausea And Vomiting        Medication List     STOP taking these medications    doxycycline  100 MG capsule Commonly known as: VIBRAMYCIN        TAKE these medications    acyclovir  400 MG tablet Commonly known as: ZOVIRAX  Take 1 tablet (400 mg total) by mouth 4 (four) times daily as needed. Please take if you start to have a cold sore   amoxicillin -clavulanate 875-125 MG tablet Commonly known as: AUGMENTIN  Take 1 tablet by mouth every 12 (twelve) hours for 7 days.   Ibuprofen  200 MG Caps Take 600 mg by mouth every 6 (six) hours as needed (for pain).   insulin  aspart protamine- aspart (70-30) 100 UNIT/ML injection Commonly known as: NOVOLOG  MIX 70/30 Inject 0.75 mLs (75 Units total) into the skin 2 (two) times daily with a meal.   insulin  aspart protamine- aspart (70-30) 100 UNIT/ML injection Commonly known as: NovoLOG  Mix 70/30 Inject 0.75 mLs (75 Units total) into the skin 2 (two) times daily with a meal.   linezolid  600 MG tablet Commonly known as: ZYVOX  Take 1 tablet  (600 mg total) by mouth every 12 (twelve) hours for 7 days.   metFORMIN  500 MG tablet Commonly known as: Glucophage  Take 1 tablet (500 mg total) by mouth daily with breakfast.   metFORMIN  500 MG tablet Commonly known as: GLUCOPHAGE  Take 1 tablet (500 mg total) by mouth daily with breakfast.   oxyCODONE  5 MG immediate release tablet Commonly known as: Oxy IR/ROXICODONE  Take 1 tablet (5 mg total) by mouth every 4 (four) hours as needed for moderate pain (pain score 4-6).               Discharge Care Instructions  (From admission, onward)           Start     Ordered   03/23/24 0000  Discharge wound care:       Comments: Continue wound VAC until you see Dr. Harden   03/23/24 1423            Follow-up Information     [facility] Follow up.   Why: Appointment:   Tuesday,  March 24, 2024 at 11:00am   Please bring photo ID, Insurance ID or money to cover appointment.   If you are unable to keep this appointment or need to reschedule, please call clinic at least 24 hours in advance. Contact information: [address] [place]  [address] [phone]        Harden Jerona GAILS, MD Follow up in 1 week(s).   Specialty: Orthopedic Surgery Why: Call to schedule apt for wound vac change Contact information: [address]  [place] [address] [phone]         [facility]. Schedule an appointment as soon as possible for a visit.   Specialty: Rehabilitation Why: Call to schedule apt for outpatient physical thearpy Contact information: [address] [place]   [address] [phone]               Discharge Exam: Fredricka Weights   03/18/24 1249 03/18/24 1732 03/20/24 0909  Weight: 113.4 kg 119.9 kg 113.4 kg   Gen: 53 y.o. male in no apparent distress.  Nontoxic Pulm: Non-labored breathing.  Clear to auscultation bilaterally.  CV: Regular rate and rhythm. No  murmur, rub, or gallop. No JVD GI: Abdomen soft, non-tender, non-distended Ext: Warm.  LLE WNL, Right LE with bandage in place and wound vac with some blood in tubing/vac.   Skin: No rashes, lesions  Neuro: Alert and oriented. No focal neurological deficits. Psych: Calm  Judgement and insight appear normal. Mood & affect appropriate.      Condition at discharge: good  The results of significant diagnostics from this hospitalization (including imaging, microbiology, ancillary and laboratory) are listed below for reference.   Imaging Studies: MR TIBIA FIBULA RIGHT WO CONTRAST Result Date: 03/18/2024 CLINICAL DATA:  Right lower leg soft tissue infection with erythema and swelling laterally EXAM: MRI OF LOWER RIGHT EXTREMITY WITHOUT CONTRAST TECHNIQUE: Multiplanar, multisequence MR imaging of the right tibia/fibula was performed. No intravenous contrast was administered. COMPARISON:  Radiographs 03/16/2024 FINDINGS: Despite efforts by the technologist and patient, motion artifact is present on today's exam and could not be eliminated. This reduces exam sensitivity and specificity. Bones/Joint/Cartilage Small knee nonfragmented osteochondral lesions laterally along the patellofemoral articulation. No findings of osteomyelitis. Ligaments Large field of view precludes sensitive assessment. No obvious collateral ligament injury in the knee. Muscles and Tendons No intramuscular abscess or substantial intramuscular edema. Tibialis posterior tenosynovitis posterior to the medial malleolus. Soft tissues Infiltrative subcutaneous edema laterally and posteriorly at the knee joint and continuing anterolaterally and posterolaterally in the mid calf. In the distal calf there is some mild cutaneous irregularity anterolaterally which could be from ulceration or wound. Focal subcutaneous fluid infiltration along the medial distal calf noted on image 64 series 13 potentially with mild cutaneous blistering. At the ankle  level, there is relatively circumferential subcutaneous edema although sparing the posterior ankle. No drainable abscess observed. Cellulitis is not excluded. IMPRESSION: 1. Subcutaneous edema in the calf, ankle, and knee, with some mild cutaneous irregularity anterolaterally in the distal calf which could be from ulceration or wound. No drainable abscess observed. Cellulitis is not excluded. 2. Tibialis posterior tenosynovitis posterior to the medial malleolus. 3. Small nonfragmented osteochondral lesions laterally along the patellofemoral articulation. Small knee effusion. Electronically Signed   By: Ryan Salvage M.D.   On: 03/18/2024 08:38   DG Tibia/Fibula Right Result Date: 03/16/2024 CLINICAL DATA:  right toe lac/cellulitis EXAM: RIGHT TIBIA AND FIBULA - 2 VIEW COMPARISON:  March 12, 2024  FINDINGS: No acute fracture or dislocation. Redemonstrated sequelae of remote trauma along the medial femoral condyle. There is no evidence of arthropathy or other focal bone abnormality. Moderate subcutaneous edema about the calf. No radiopaque gas or radiopaque foreign body. IMPRESSION: Moderate subcutaneous edema about the calf. No subcutaneous gas or radiographic findings of osteomyelitis, at this time. Electronically Signed   By: Rogelia Myers M.D.   On: 03/16/2024 16:49   DG Foot 2 Views Right Result Date: 03/16/2024 CLINICAL DATA:  right toe lac/cellulitis EXAM: RIGHT FOOT - 2 VIEW COMPARISON:  12/17/2017 FINDINGS: Interval amputation of the great toe.No acute fracture or dislocation. Degenerative spurring along the midfoot. Moderate soft tissue swelling. No subcutaneous gas. IMPRESSION: Moderate soft tissue swelling about the foot. No subcutaneous gas or radiographic findings of osteomyelitis, at this time. Electronically Signed   By: Rogelia Myers M.D.   On: 03/16/2024 16:47   VAS US  LOWER EXTREMITY VENOUS (DVT) (ONLY [facility] & [facility]) Result Date: 03/12/2024  Lower Venous DVT Study Patient Name:  KYLOR VALVERDE  Date of Exam:   03/12/2024 Medical Rec #: [id number]        Accession #:    [id number] Date of Birth: [birth year]        Patient Gender: M Patient Age:   21 years Exam Location:  [facility] Procedure:      VAS US  LOWER EXTREMITY VENOUS (DVT) Referring Phys: [place] --------------------------------------------------------------------------------  Indications: Pain, Swelling, and Edema.  Comparison Study: No prior exam. Performing Technologist: Edilia Elden Appl  Examination Guidelines: A complete evaluation includes B-mode imaging, spectral Doppler, color Doppler, and power Doppler as needed of all accessible portions of each vessel. Bilateral testing is considered an integral part of a complete examination. Limited examinations for reoccurring indications may be performed as noted. The reflux portion of the exam is performed with the patient in reverse Trendelenburg.  +---------+---------------+---------+-----------+----------+--------------+ RIGHT    CompressibilityPhasicitySpontaneityPropertiesThrombus Aging +---------+---------------+---------+-----------+----------+--------------+ CFV      Full           Yes      Yes                                 +---------+---------------+---------+-----------+----------+--------------+ SFJ      Full           Yes      Yes                                 +---------+---------------+---------+-----------+----------+--------------+ FV Prox  Full                                                        +---------+---------------+---------+-----------+----------+--------------+ FV Mid   Full                                                        +---------+---------------+---------+-----------+----------+--------------+ FV DistalFull                                                        +---------+---------------+---------+-----------+----------+--------------+  PFV      Full                                                         +---------+---------------+---------+-----------+----------+--------------+ POP      Full           Yes      Yes                                 +---------+---------------+---------+-----------+----------+--------------+ PTV      Full                                                        +---------+---------------+---------+-----------+----------+--------------+ PERO     Full                                                        +---------+---------------+---------+-----------+----------+--------------+   +----+---------------+---------+-----------+----------+--------------+ LEFTCompressibilityPhasicitySpontaneityPropertiesThrombus Aging +----+---------------+---------+-----------+----------+--------------+ CFV Full           Yes      Yes                                 +----+---------------+---------+-----------+----------+--------------+ SFJ Full           Yes      Yes                                 +----+---------------+---------+-----------+----------+--------------+     Summary: RIGHT: - There is no evidence of deep vein thrombosis in the lower extremity.  - No cystic structure found in the popliteal fossa.  LEFT: - No evidence of common femoral vein obstruction.   *See table(s) above for measurements and observations. Electronically signed by Debby Robertson on 03/12/2024 at 11:42:43 AM.    Final    DG Tibia/Fibula Right Result Date: 03/12/2024 CLINICAL DATA:  Right leg and calf pain EXAM: RIGHT TIBIA AND FIBULA - 2 VIEW COMPARISON:  None Available. FINDINGS: There is no evidence of fracture. Well corticated curvilinear ossification projects over the medial femoral condyle, which may reflect sequela of remote injury. Degenerative changes of the knee and ankle. Diffuse subcutaneous soft tissue reticulation of the distal lower leg. Partially imaged prior great toe amputation. IMPRESSION: 1. No acute fracture or dislocation. 2. Diffuse  subcutaneous soft tissue reticulation of the distal lower leg, which may reflect edema or cellulitis. Electronically Signed   By: Limin  Xu M.D.   On: 03/12/2024 09:47    Microbiology: Results for orders placed or performed during the hospital encounter of 03/16/24  Blood Culture (routine x 2)     Status: None   Collection Time: 03/16/24  4:57 PM   Specimen: BLOOD RIGHT ARM  Result Value Ref Range Status   Specimen Description   Final    BLOOD RIGHT ARM Performed at [facility],  [address], [place], [place] [address]    Special Requests   Final    BOTTLES DRAWN AEROBIC AND ANAEROBIC Blood Culture adequate volume Performed at [facility], [address], [place], [place] [address]    Culture   Final    NO GROWTH 5 DAYS Performed at [facility], [address] [place], [place] [address]    Report Status 03/21/2024 FINAL  Final  Blood Culture (routine x 2)     Status: None   Collection Time: 03/16/24 10:18 PM   Specimen: BLOOD  Result Value Ref Range Status   Specimen Description   Final    BLOOD RIGHT ANTECUBITAL Performed at [facility], [address], [place], [place] [address]    Special Requests   Final    Blood Culture results may not be optimal due to an inadequate volume of blood received in culture bottles BOTTLES DRAWN AEROBIC AND ANAEROBIC Performed at [facility], [address], [place], [place] [address]    Culture   Final    NO GROWTH 5 DAYS Performed at [facility], [address], [place], [place] [address]    Report Status 03/22/2024 FINAL  Final  Aerobic/Anaerobic Culture w Gram Stain (surgical/deep wound)     Status: None (Preliminary result)   Collection Time: 03/18/24  3:08 PM   Specimen: Leg, Right; Tissue  Result Value Ref Range Status   Specimen Description TISSUE RIGHT LEG  Final   Special Requests NONE  Final   Gram Stain   Final    FEW WBC PRESENT,  PREDOMINANTLY PMN RARE GRAM POSITIVE COCCI IN PAIRS Performed at [facility], [address], [place], [place] [address]    Culture   Final    MODERATE METHICILLIN RESISTANT STAPHYLOCOCCUS AUREUS MODERATE GROUP B STREP(S.AGALACTIAE)ISOLATED TESTING AGAINST S. AGALACTIAE NOT ROUTINELY PERFORMED DUE TO PREDICTABILITY OF AMP/PEN/VAN SUSCEPTIBILITY. NO ANAEROBES ISOLATED; CULTURE IN PROGRESS FOR 5 DAYS    Report Status PENDING  Incomplete   Organism ID, Bacteria METHICILLIN RESISTANT STAPHYLOCOCCUS AUREUS  Final      Susceptibility   Methicillin resistant staphylococcus aureus - MIC*    CIPROFLOXACIN  <=0.5 SENSITIVE Sensitive     ERYTHROMYCIN  <=0.25 SENSITIVE Sensitive     GENTAMICIN  <=0.5 SENSITIVE Sensitive     OXACILLIN >=4 RESISTANT Resistant     TETRACYCLINE <=1 SENSITIVE Sensitive     VANCOMYCIN  1 SENSITIVE Sensitive     TRIMETH /SULFA  <=10 SENSITIVE Sensitive     CLINDAMYCIN  <=0.25 SENSITIVE Sensitive     RIFAMPIN <=0.5 SENSITIVE Sensitive     Inducible Clindamycin  NEGATIVE Sensitive     LINEZOLID  2 SENSITIVE Sensitive     * MODERATE METHICILLIN RESISTANT STAPHYLOCOCCUS AUREUS    Labs: CBC: Recent Labs  Lab 03/16/24 1657 03/17/24 0052 03/19/24 0455 03/20/24 0402 03/21/24 0802 03/22/24 0308 03/23/24 0422  WBC 13.9*   < > 12.9* 10.1 11.9* 12.2* 13.4*  NEUTROABS 9.9*  --   --   --   --   --   --   HGB 13.5   < > 9.9* 9.9* 9.4* 9.4* 10.3*  HCT 42.7   < > 30.2* 30.2* 28.9* 28.6* 32.0*  MCV 83.6   < > 83.9 83.7 84.3 82.4 83.6  PLT 309   < > 289 285 280 315 395   < > = values in this interval not displayed.   Basic Metabolic Panel: Recent Labs  Lab 03/19/24 0455 03/20/24 0402  03/21/24 0802 03/22/24 0308 03/23/24 0422  NA 132* 136 133* 133* 134*  K 3.3* 3.8 3.8 3.3* 3.5  CL 96* 100 98 97* 96*  CO2 25 22 25 27 27   GLUCOSE 255* 206* 170* 171* 198*  BUN 7 5* <5* <5* 5*  CREATININE 0.64 0.42* 0.56* 0.53* 0.60*  CALCIUM  7.6* 8.1* 7.7* 7.7* 8.0*   Liver  Function Tests: Recent Labs  Lab 03/16/24 1657  AST 13*  ALT 12  ALKPHOS 91  BILITOT 0.6  PROT 7.0  ALBUMIN 3.3*   CBG: Recent Labs  Lab 03/22/24 1202 03/22/24 1627 03/22/24 2140 03/23/24 0811 03/23/24 1213  GLUCAP 189* 192* 164* 187* 169*    Discharge time spent: less than 30 minutes.  Signed: Reyes VEAR Gaw, MD [facility] 03/23/2024

## 2024-03-23 NOTE — Progress Notes (Signed)
 Pt ambulated to bathroom with assistance  and had a large bowel movement

## 2024-03-24 ENCOUNTER — Ambulatory Visit (HOSPITAL_COMMUNITY): Admission: RE | Admit: 2024-03-24 | Payer: Self-pay | Source: Ambulatory Visit

## 2024-03-24 LAB — CALCIUM, IONIZED: Calcium, Ionized, Serum: 4.9 mg/dL (ref 4.5–5.6)

## 2024-03-27 ENCOUNTER — Encounter: Payer: Self-pay | Admitting: Family

## 2024-03-27 ENCOUNTER — Ambulatory Visit: Payer: Self-pay | Admitting: Family

## 2024-03-27 DIAGNOSIS — E11628 Type 2 diabetes mellitus with other skin complications: Secondary | ICD-10-CM

## 2024-03-27 DIAGNOSIS — L02416 Cutaneous abscess of left lower limb: Secondary | ICD-10-CM

## 2024-03-27 DIAGNOSIS — L089 Local infection of the skin and subcutaneous tissue, unspecified: Secondary | ICD-10-CM

## 2024-03-27 DIAGNOSIS — A419 Sepsis, unspecified organism: Secondary | ICD-10-CM

## 2024-03-27 MED ORDER — INSULIN GLARGINE 100 UNIT/ML ~~LOC~~ SOLN: 45.0000 [IU] | Freq: Every day | SUBCUTANEOUS | 11 refills | Status: AC

## 2024-03-27 MED ORDER — OXYCODONE HCL 5 MG PO TABS: 5.0000 mg | ORAL_TABLET | Freq: Four times a day (QID) | ORAL | 0 refills | Status: AC | PRN

## 2024-03-27 NOTE — Progress Notes (Signed)
 Post-Op Visit Note   Patient: Zachary Schneider           Date of Birth: 1971/04/27           MRN: [id number] Visit Date: 03/27/2024 PCP: Pcp, No  Chief Complaint:  Chief Complaint  Patient presents with   Right Leg - Routine Post Op    03/20/2024 I&D RLE    HPI:  HPI The patient is a 53 year old gentleman who presents status post I&D of the right lower extremity September 19. Wound VAC removed today. Ortho Exam On examination right lower extremity the incision is well-approximated with sutures proximally and distally centrally the area of ulceration that was left open is filled in with full granulation tissue there is no surrounding maceration erythema no ischemic changes  Visit Diagnoses: No diagnosis found.  Plan: Instructed on cleansing with Vashe or Dial soap they will pack open the ulcer with Vashe soaked gauze  Given some supplies for the first 2 days he will follow-up in the office with Dr. Harden in 1 week.  Continue with amoxicillin  and Zyvox  as prescribed  Follow-Up Instructions: No follow-ups on file.   Imaging: No results found.  Orders:  No orders of the defined types were placed in this encounter.  No orders of the defined types were placed in this encounter.    PMFS History: Patient Active Problem List   Diagnosis Date Noted   Abscess of left lower leg 03/18/2024   Hyperglycemia    Cellulitis of right lower extremity    Uncontrolled type 2 diabetes mellitus with hyperglycemia (HCC) 12/18/2017   Diabetic foot infection (HCC) 12/18/2017   Sepsis due to undetermined organism (HCC) 12/18/2017   Diabetic foot ulcer (HCC) 12/16/2017   Type 2 diabetes mellitus (HCC) 12/16/2017   Tobacco use 12/16/2017   BMI 39.0-39.9,adult 12/16/2017   SBO (small bowel obstruction) (HCC) 07/14/2017   Ileus (HCC) 07/14/2017   Past Medical History:  Diagnosis Date   Cancer (HCC)    testicular   Diabetes mellitus without complication (HCC)    Obese    Tobacco use      Family History  Problem Relation Age of Onset   Breast cancer Mother    Multiple sclerosis Father    Diabetes Mellitus II Paternal Grandmother    Lupus Paternal Grandmother     Past Surgical History:  Procedure Laterality Date   INCISION AND DRAINAGE OF DEEP ABSCESS, CALF Right 03/20/2024   Procedure: INCISION AND DRAINAGE OF DEEP ABSCESS, CALF;  Surgeon: Harden Jerona GAILS, MD;  Location: [facility] OR;  Service: Orthopedics;  Laterality: Right;  IRRIGATION AND DEBRIDEMENT WOUND RIGHT LEG   INCISION AND DRAINAGE OF WOUND Right 12/18/2017   Procedure: DEBRIDEMENT OF INFECTED DIABETIC FOOT ULCER RIGHT FOOT;  Surgeon: Blinda Katz, DPM;  Location: [facility];  Service: Podiatry;  Laterality: Right;   INCISION AND DRAINAGE OF WOUND Right 03/18/2024   Procedure: IRRIGATION AND DEBRIDEMENT WOUND;  Surgeon: Harden Jerona GAILS, MD;  Location: [facility] OR;  Service: Orthopedics;  Laterality: Right;  IRRIGATION AND DEBRIDEMENT RIGHT LEG   KNEE SURGERY     TOE SURGERY Right    Social History   Occupational History   Not on file  Tobacco Use   Smoking status: Some Days    Current packs/day: 0.50    Types: Cigarettes   Smokeless tobacco: Never  Vaping Use   Vaping status: Never Used  Substance and Sexual Activity   Alcohol use: No   Drug use: No  Sexual activity: Not on file

## 2024-03-30 ENCOUNTER — Encounter: Payer: Self-pay | Admitting: Physician Assistant

## 2024-04-06 ENCOUNTER — Ambulatory Visit: Payer: Self-pay | Admitting: Orthopedic Surgery

## 2024-04-06 DIAGNOSIS — E11628 Type 2 diabetes mellitus with other skin complications: Secondary | ICD-10-CM

## 2024-04-06 DIAGNOSIS — L089 Local infection of the skin and subcutaneous tissue, unspecified: Secondary | ICD-10-CM

## 2024-04-07 ENCOUNTER — Encounter: Payer: Self-pay | Admitting: Orthopedic Surgery

## 2024-04-07 NOTE — Progress Notes (Signed)
 Office Visit Note   Patient: Zachary Schneider           Date of Birth: Apr 28, 1971           MRN: [id number] Visit Date: 04/06/2024              Requested by: No referring provider defined for this encounter. PCP: Pcp, No  Chief Complaint  Patient presents with   Right Leg - Routine Post Op    03/20/2024 I&D RLE      HPI: Discussed the use of AI scribe software for clinical note transcription with the patient, who gave verbal consent to proceed.  History of Present Illness Zachary Schneider is a 53 year old male who presents with concerns about wound healing and foot care following a previous infection.  He is concerned about a wound with necrotic tissue, presenting as black spots surrounded by healthy tissue. There is swelling. The wound is located on the bottom of his foot and is not healing well. The skin is dry and cracked.  He has completed a course of antibiotics and is currently not on any.  He uses a blue bottle of Vash for dressing changes but is running low and requires a refill.  He is encouraged to keep his foot elevated above his heart when not moving to reduce swelling. He is also encouraged to move his ankle and toes to prevent stiffness and can use a towel or sheet to assist with stretching if needed. Walking is encouraged to help pump the muscles, but standing or sitting with the foot on the ground is discouraged.     Assessment & Plan: Visit Diagnoses: No diagnosis found.  Plan: Assessment and Plan Assessment & Plan Non-healing foot wound with tissue necrosis and granulation tissue The wound showed necrotic tissue and healthy granulation. Necrosis due to prior infection and swelling affected circulation. Healthy surrounding tissue indicates good circulation. Expected to heal with tissue grafts. - Remove stitches to prevent infection from dead space. - Continue foot elevation above heart level when inactive. - Encourage ankle and toe movement to reduce  swelling. - Consider additional tissue grafts as needed. - Continue Vash dressing changes.  Tinea pedis (foot fungus) Dry, cracked skin due to tinea pedis. Keeping the foot dry is crucial. - Keep the foot dry. - Use special socks to eliminate the fungus.      Follow-Up Instructions: No follow-ups on file.   Ortho Exam  Patient is alert, oriented, no adenopathy, well-dressed, normal affect, normal respiratory effort. Physical Exam SKIN: Wound bed with healthy granulation tissue. Mild ischemic changes around wound edges, appearing superficial. Large open area of healthy granulation tissue.      Imaging: No results found. No images are attached to the encounter.  Labs: Lab Results  Component Value Date   HGBA1C 12.3 (H) 03/17/2024   HGBA1C 13.1 (H) 12/17/2017   HGBA1C 12.2 (H) 07/15/2017   ESRSEDRATE 65 (H) 03/17/2024   ESRSEDRATE 17 (H) 01/10/2018   ESRSEDRATE 24 (H) 12/16/2017   CRP 10.2 (H) 03/17/2024   CRP 2.9 (H) 01/10/2018   CRP 18.8 (H) 12/16/2017   REPTSTATUS 03/23/2024 FINAL 03/18/2024   GRAMSTAIN  03/18/2024    FEW WBC PRESENT, PREDOMINANTLY PMN RARE GRAM POSITIVE COCCI IN PAIRS    CULT  03/18/2024    MODERATE METHICILLIN RESISTANT STAPHYLOCOCCUS AUREUS MODERATE GROUP B STREP(S.AGALACTIAE)ISOLATED TESTING AGAINST S. AGALACTIAE NOT ROUTINELY PERFORMED DUE TO PREDICTABILITY OF AMP/PEN/VAN SUSCEPTIBILITY. NO ANAEROBES ISOLATED Performed at [facility], [address] [place], [place] [address]    LABORGA METHICILLIN RESISTANT STAPHYLOCOCCUS AUREUS 03/18/2024     Lab Results  Component Value Date   ALBUMIN 3.3 (L) 03/16/2024   ALBUMIN 3.3 (L) 08/31/2023   ALBUMIN 3.6 12/16/2017   PREALBUMIN 16.9 (L) 12/16/2017    Lab Results  Component Value Date   MG 1.9 12/20/2017   MG 2.0 12/16/2017   MG 2.0 05/01/2011   No results found for: [facility]  Lab Results  Component Value Date   PREALBUMIN 16.9 (L) 12/16/2017      Latest Ref Rng &  Units 03/23/2024    4:22 AM 03/22/2024    3:08 AM 03/21/2024    8:02 AM  CBC EXTENDED  WBC 4.0 - 10.5 K/uL 13.4  12.2  11.9   RBC 4.22 - 5.81 MIL/uL 3.83  3.47  3.43   Hemoglobin 13.0 - 17.0 g/dL 89.6  9.4  9.4   HCT 60.9 - 52.0 % 32.0  28.6  28.9   Platelets 150 - 400 K/uL 395  315  280      There is no height or weight on file to calculate BMI.  Orders:  No orders of the defined types were placed in this encounter.  No orders of the defined types were placed in this encounter.    Procedures: No procedures performed  Clinical Data: No additional findings.  ROS:  All other systems negative, except as noted in the HPI. Review of Systems  Objective: Vital Signs: There were no vitals taken for this visit.  Specialty Comments:  No specialty comments available.  PMFS History: Patient Active Problem List   Diagnosis Date Noted   Abscess of left lower leg 03/18/2024   Hyperglycemia    Cellulitis of right lower extremity    Uncontrolled type 2 diabetes mellitus with hyperglycemia (HCC) 12/18/2017   Diabetic foot infection (HCC) 12/18/2017   Sepsis due to undetermined organism (HCC) 12/18/2017   Diabetic foot ulcer (HCC) 12/16/2017   Type 2 diabetes mellitus (HCC) 12/16/2017   Tobacco use 12/16/2017   BMI 39.0-39.9,adult 12/16/2017   SBO (small bowel obstruction) (HCC) 07/14/2017   Ileus (HCC) 07/14/2017   Past Medical History:  Diagnosis Date   Cancer (HCC)    testicular   Diabetes mellitus without complication (HCC)    Obese    Tobacco use     Family History  Problem Relation Age of Onset   Breast cancer Mother    Multiple sclerosis Father    Diabetes Mellitus II Paternal Grandmother    Lupus Paternal Grandmother     Past Surgical History:  Procedure Laterality Date   INCISION AND DRAINAGE OF DEEP ABSCESS, CALF Right 03/20/2024   Procedure: INCISION AND DRAINAGE OF DEEP ABSCESS, CALF;  Surgeon: Harden Jerona GAILS, MD;  Location: [facility] OR;  Service: Orthopedics;   Laterality: Right;  IRRIGATION AND DEBRIDEMENT WOUND RIGHT LEG   INCISION AND DRAINAGE OF WOUND Right 12/18/2017   Procedure: DEBRIDEMENT OF INFECTED DIABETIC FOOT ULCER RIGHT FOOT;  Surgeon: Blinda Katz, DPM;  Location: [facility];  Service: Podiatry;  Laterality: Right;   INCISION AND DRAINAGE OF WOUND Right 03/18/2024   Procedure: IRRIGATION AND DEBRIDEMENT WOUND;  Surgeon: Harden Jerona GAILS, MD;  Location: [facility] OR;  Service: Orthopedics;  Laterality: Right;  IRRIGATION AND DEBRIDEMENT RIGHT LEG   KNEE SURGERY     TOE SURGERY Right    Social History   Occupational History   Not on file  Tobacco Use  Smoking status: Some Days    Current packs/day: 0.50    Types: Cigarettes   Smokeless tobacco: Never  Vaping Use   Vaping status: Never Used  Substance and Sexual Activity   Alcohol use: No   Drug use: No   Sexual activity: Not on file

## 2024-04-10 ENCOUNTER — Encounter: Payer: Self-pay | Admitting: Family

## 2024-04-13 ENCOUNTER — Ambulatory Visit: Payer: Self-pay | Admitting: Orthopedic Surgery

## 2024-04-13 DIAGNOSIS — S81801A Unspecified open wound, right lower leg, initial encounter: Secondary | ICD-10-CM

## 2024-04-14 ENCOUNTER — Encounter: Payer: Self-pay | Admitting: Orthopedic Surgery

## 2024-04-14 ENCOUNTER — Ambulatory Visit: Payer: Self-pay | Attending: Family Medicine

## 2024-04-14 DIAGNOSIS — L089 Local infection of the skin and subcutaneous tissue, unspecified: Secondary | ICD-10-CM | POA: Insufficient documentation

## 2024-04-14 DIAGNOSIS — E11628 Type 2 diabetes mellitus with other skin complications: Secondary | ICD-10-CM | POA: Insufficient documentation

## 2024-04-14 DIAGNOSIS — L03115 Cellulitis of right lower limb: Secondary | ICD-10-CM | POA: Insufficient documentation

## 2024-04-14 DIAGNOSIS — L02415 Cutaneous abscess of right lower limb: Secondary | ICD-10-CM | POA: Insufficient documentation

## 2024-04-14 DIAGNOSIS — L02416 Cutaneous abscess of left lower limb: Secondary | ICD-10-CM | POA: Insufficient documentation

## 2024-04-14 NOTE — Progress Notes (Signed)
 Office Visit Note   Patient: JOBIE POPP           Date of Birth: 13-Jul-1970           MRN: [id number] Visit Date: 04/13/2024              Requested by: No referring provider defined for this encounter. PCP: Pcp, No  Chief Complaint  Patient presents with   Right Leg - Routine Post Op    03/20/2024 I&D RLE      HPI: Discussed the use of AI scribe software for clinical note transcription with the patient, who gave verbal consent to proceed.  History of Present Illness TERRELLE RUFFOLO is a 53 year old male who presents for follow-up of a surgical wound three weeks post-operation.  He is three weeks post-surgery and is here for a follow-up on his surgical wound. He is currently using Vosh dressing changes once a day.  He is planning to return to work and drive.     Assessment & Plan: Visit Diagnoses:  1. Leg wound, right, initial encounter     Plan: Assessment and Plan Assessment & Plan Postoperative right lower extremity wound care Wound healing well with healthy granulation tissue, no infection or complications. - Continue leg elevation and compression. - Continue daily Vosh dressing changes. - Allow return to work and driving with activity caution. - Follow-up in three weeks.      Follow-Up Instructions: Return in about 3 weeks (around 05/04/2024).   Ortho Exam  Patient is alert, oriented, no adenopathy, well-dressed, normal affect, normal respiratory effort. Physical Exam SKIN: Wound bed with flat, healthy granulation tissue. Wound size 10x4 cm. Proximal incision well approximated.      Imaging: No results found.    Labs: Lab Results  Component Value Date   HGBA1C 12.3 (H) 03/17/2024   HGBA1C 13.1 (H) 12/17/2017   HGBA1C 12.2 (H) 07/15/2017   ESRSEDRATE 65 (H) 03/17/2024   ESRSEDRATE 17 (H) 01/10/2018   ESRSEDRATE 24 (H) 12/16/2017   CRP 10.2 (H) 03/17/2024   CRP 2.9 (H) 01/10/2018   CRP 18.8 (H) 12/16/2017   REPTSTATUS 03/23/2024 FINAL  03/18/2024   GRAMSTAIN  03/18/2024    FEW WBC PRESENT, PREDOMINANTLY PMN RARE GRAM POSITIVE COCCI IN PAIRS    CULT  03/18/2024    MODERATE METHICILLIN RESISTANT STAPHYLOCOCCUS AUREUS MODERATE GROUP B STREP(S.AGALACTIAE)ISOLATED TESTING AGAINST S. AGALACTIAE NOT ROUTINELY PERFORMED DUE TO PREDICTABILITY OF AMP/PEN/VAN SUSCEPTIBILITY. NO ANAEROBES ISOLATED Performed at [facility], [address] [place], [place] [address]    LABORGA METHICILLIN RESISTANT STAPHYLOCOCCUS AUREUS 03/18/2024     Lab Results  Component Value Date   ALBUMIN 3.3 (L) 03/16/2024   ALBUMIN 3.3 (L) 08/31/2023   ALBUMIN 3.6 12/16/2017   PREALBUMIN 16.9 (L) 12/16/2017    Lab Results  Component Value Date   MG 1.9 12/20/2017   MG 2.0 12/16/2017   MG 2.0 05/01/2011   No results found for: [facility]  Lab Results  Component Value Date   PREALBUMIN 16.9 (L) 12/16/2017      Latest Ref Rng & Units 03/23/2024    4:22 AM 03/22/2024    3:08 AM 03/21/2024    8:02 AM  CBC EXTENDED  WBC 4.0 - 10.5 K/uL 13.4  12.2  11.9   RBC 4.22 - 5.81 MIL/uL 3.83  3.47  3.43   Hemoglobin 13.0 - 17.0 g/dL 89.6  9.4  9.4   HCT 60.9 - 52.0 % 32.0  28.6  28.9   Platelets 150 - 400 K/uL 395  315  280      There is no height or weight on file to calculate BMI.  Orders:  No orders of the defined types were placed in this encounter.  No orders of the defined types were placed in this encounter.    Procedures: No procedures performed  Clinical Data: No additional findings.  ROS:  All other systems negative, except as noted in the HPI. Review of Systems  Objective: Vital Signs: There were no vitals taken for this visit.  Specialty Comments:  No specialty comments available.  PMFS History: Patient Active Problem List   Diagnosis Date Noted   Abscess of left lower leg 03/18/2024   Hyperglycemia    Cellulitis of right lower extremity    Uncontrolled type 2 diabetes mellitus with hyperglycemia (HCC)  12/18/2017   Diabetic foot infection (HCC) 12/18/2017   Sepsis due to undetermined organism (HCC) 12/18/2017   Diabetic foot ulcer (HCC) 12/16/2017   Type 2 diabetes mellitus (HCC) 12/16/2017   Tobacco use 12/16/2017   BMI 39.0-39.9,adult 12/16/2017   SBO (small bowel obstruction) (HCC) 07/14/2017   Ileus (HCC) 07/14/2017   Past Medical History:  Diagnosis Date   Cancer (HCC)    testicular   Diabetes mellitus without complication (HCC)    Obese    Tobacco use     Family History  Problem Relation Age of Onset   Breast cancer Mother    Multiple sclerosis Father    Diabetes Mellitus II Paternal Grandmother    Lupus Paternal Grandmother     Past Surgical History:  Procedure Laterality Date   INCISION AND DRAINAGE OF DEEP ABSCESS, CALF Right 03/20/2024   Procedure: INCISION AND DRAINAGE OF DEEP ABSCESS, CALF;  Surgeon: Harden Jerona GAILS, MD;  Location: [facility];  Service: Orthopedics;  Laterality: Right;  IRRIGATION AND DEBRIDEMENT WOUND RIGHT LEG   INCISION AND DRAINAGE OF WOUND Right 12/18/2017   Procedure: DEBRIDEMENT OF INFECTED DIABETIC FOOT ULCER RIGHT FOOT;  Surgeon: Blinda Katz, DPM;  Location: [facility];  Service: Podiatry;  Laterality: Right;   INCISION AND DRAINAGE OF WOUND Right 03/18/2024   Procedure: IRRIGATION AND DEBRIDEMENT WOUND;  Surgeon: Harden Jerona GAILS, MD;  Location: [facility] OR;  Service: Orthopedics;  Laterality: Right;  IRRIGATION AND DEBRIDEMENT RIGHT LEG   KNEE SURGERY     TOE SURGERY Right    Social History   Occupational History   Not on file  Tobacco Use   Smoking status: Some Days    Current packs/day: 0.50    Types: Cigarettes   Smokeless tobacco: Never  Vaping Use   Vaping status: Never Used  Substance and Sexual Activity   Alcohol use: No   Drug use: No   Sexual activity: Not on file

## 2024-04-14 NOTE — Therapy (Signed)
 OUTPATIENT PHYSICAL THERAPY LOWER EXTREMITY EVALUATION   Patient Name: Zachary Schneider MRN: [id number] DOB:04-20-1971, 53 y.o., male Today's Date: 04/14/2024  END OF SESSION:  PT End of Session - 04/14/24 1023     Visit Number 1    Authorization Type Self Pay    PT Start Time 1023    PT Stop Time 1100    PT Time Calculation (min) 37 min          Past Medical History:  Diagnosis Date   Cancer (HCC)    testicular   Diabetes mellitus without complication (HCC)    Obese    Tobacco use    Past Surgical History:  Procedure Laterality Date   INCISION AND DRAINAGE OF DEEP ABSCESS, CALF Right 03/20/2024   Procedure: INCISION AND DRAINAGE OF DEEP ABSCESS, CALF;  Surgeon: Harden Jerona GAILS, MD;  Location: [facility] OR;  Service: Orthopedics;  Laterality: Right;  IRRIGATION AND DEBRIDEMENT WOUND RIGHT LEG   INCISION AND DRAINAGE OF WOUND Right 12/18/2017   Procedure: DEBRIDEMENT OF INFECTED DIABETIC FOOT ULCER RIGHT FOOT;  Surgeon: Blinda Katz, DPM;  Location: [facility];  Service: Podiatry;  Laterality: Right;   INCISION AND DRAINAGE OF WOUND Right 03/18/2024   Procedure: IRRIGATION AND DEBRIDEMENT WOUND;  Surgeon: Harden Jerona GAILS, MD;  Location: [facility] OR;  Service: Orthopedics;  Laterality: Right;  IRRIGATION AND DEBRIDEMENT RIGHT LEG   KNEE SURGERY     TOE SURGERY Right    Patient Active Problem List   Diagnosis Date Noted   Abscess of left lower leg 03/18/2024   Hyperglycemia    Cellulitis of right lower extremity    Uncontrolled type 2 diabetes mellitus with hyperglycemia (HCC) 12/18/2017   Diabetic foot infection (HCC) 12/18/2017   Sepsis due to undetermined organism (HCC) 12/18/2017   Diabetic foot ulcer (HCC) 12/16/2017   Type 2 diabetes mellitus (HCC) 12/16/2017   Tobacco use 12/16/2017   BMI 39.0-39.9,adult 12/16/2017   SBO (small bowel obstruction) (HCC) 07/14/2017   Ileus (HCC) 07/14/2017    PCP: No PCP  REFERRING PROVIDER: Juliane Gaw  REFERRING DIAG:  L03.115  (ICD-10-CM) - Cellulitis of right lower extremity  E11.628,L08.9 (ICD-10-CM) - Diabetic foot infection (HCC)  L02.416 (ICD-10-CM) - Abscess of left lower leg    THERAPY DIAG:  No diagnosis found.  Rationale for Evaluation and Treatment: Rehabilitation  ONSET DATE: 03/11/24  SUBJECTIVE:   SUBJECTIVE STATEMENT: I had cellulitis and it got infected. They cut me from my knee down to my foot, there is still a hole above my ankle. The doctor said there is also a spot on my foot but I don't see that one. I don't have either big toe.   PERTINENT HISTORY: DM, hx of cancer 93' 2022 R big toe amputation L toe amputation  PAIN:  Are you having pain? No  PRECAUTIONS: Fall  RED FLAGS: None   WEIGHT BEARING RESTRICTIONS: No  FALLS:  Has patient fallen in last 6 months? 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  DIAGNOSTIC FINDINGS:  IMPRESSION: 1. Subcutaneous edema in the calf, ankle,  and knee, with some mild cutaneous irregularity anterolaterally in the distal calf which could be from ulceration or wound. No drainable abscess observed. Cellulitis is not excluded. 2. Tibialis posterior tenosynovitis posterior to the medial malleolus. 3. Small nonfragmented osteochondral lesions laterally along the patellofemoral articulation. Small knee effusion.   Moderate soft tissue swelling about the foot. No subcutaneous gas or radiographic findings of osteomyelitis, at this time.    COGNITION: Overall cognitive status: Within functional limits for tasks  assessed     SENSATION: WFL  MUSCLE LENGTH: Tightness in hip, HS, and calves  POSTURE: rounded shoulders  LOWER EXTREMITY ROM: grossly WFL, some limitation in R foot DF might be due to ongoing healing of wound   LOWER EXTREMITY MMT: 5/5 BLE   FUNCTIONAL TESTS:  5 times sit to stand: 19s Timed up and go (TUG): 10.43s BERG 51/56  GAIT: Distance walked: in clinic distances Assistive device utilized: None Level of assistance: Complete Independence Comments: walks with a limp due to missing toes bilaterally, decreased push off                                                                                                                                 TREATMENT DATE: 04/14/24- EVAL    PATIENT EDUCATION:  Education details: POC, HEP Person educated: Patient Education method: Medical illustrator Education comprehension: verbalized understanding  HOME EXERCISE PROGRAM: Access Code: [email or web address] URL: [email or web address] Date: 04/14/2024 Prepared by: Almetta Fam  Exercises - Supine Active Straight Leg Raise  - 1 x daily - 7 x weekly - 2 sets - 10 reps - Sit to Stand  - 1 x daily - 7 x weekly - 2 sets - 10 reps - Gastroc Stretch on Wall  - 1 x daily - 7 x weekly - 2 reps - 15 hold - Heel Toe Raises with Counter Support  - 1 x daily - 7 x weekly - 2 sets - 10 reps - Standing 3-Way Leg Reach with Resistance at Ankles and Counter Support  - 1 x daily - 7 x weekly - 2 sets - 10 reps - Tandem Walking  - 1 x daily - 7 x weekly - 2 sets - 10 reps - Single Leg Stance  - 1 x daily - 7 x weekly - 2 sets - 5 reps - 10 hold  ASSESSMENT:  CLINICAL IMPRESSION: Patient is a 53 y.o. male who was seen today for physical therapy evaluation and treatment for cellulitis and deep abscess on his calf. The wound seems to be healing, although slowly. He has had bilateral big toe amputations with deter his normal gait pattern and his balance. Overall he is functionally  doing well and has been cleared to return to work and drive. Due to absence of functional deficits and no insurance/being self pay skilled physical therapy services are not warranted at this time. PT discussed this w/ pt and he was receptive. Pt encouraged to  call back with any specific changes or development of limitations during functional activities, as well as to continue follow-up with physician, as needed.?      OBJECTIVE IMPAIRMENTS: Abnormal gait, decreased balance, and impaired flexibility.   ACTIVITY LIMITATIONS: locomotion level  PARTICIPATION LIMITATIONS: occupation and yard work  PERSONAL FACTORS: Age, Fitness, Time since onset of injury/illness/exacerbation, and 1 comorbidity: DM are also affecting patient's functional outcome.   REHAB POTENTIAL: Good  CLINICAL DECISION MAKING: Stable/uncomplicated  EVALUATION COMPLEXITY: Low   GOALS: Goals reviewed with patient? Yes  SHORT TERM GOALS: Target date: 04/14/24 Pt will verbalize understanding of role/purpose of physical therapy services and be able to independently identify if/when she may need to seek an add'l referral for services to assist w/ participation in functional activities Baseline: Goal status: MET   PLAN:  PT FREQUENCY: one time visit  PT DURATION: other: 1x visit  PLANNED INTERVENTIONS: 97110-Therapeutic exercises, 97530- Therapeutic activity, 97112- Neuromuscular re-education, 97535- Self Care, and Patient/Family education  PLAN FOR NEXT SESSION:  Skilled PT services are not warranted at this time; pt encouraged to call back with any changes in functional status or limitations.   Almetta Fam, PT 04/14/2024, 10:58 AM

## 2024-04-27 ENCOUNTER — Ambulatory Visit: Admitting: Physician Assistant

## 2024-04-28 ENCOUNTER — Ambulatory Visit: Admitting: Physician Assistant

## 2024-05-04 ENCOUNTER — Encounter: Payer: Self-pay | Admitting: Radiology

## 2024-05-04 ENCOUNTER — Encounter: Payer: Self-pay | Admitting: Orthopedic Surgery

## 2024-05-05 ENCOUNTER — Encounter: Payer: Self-pay | Admitting: Family

## 2024-05-05 ENCOUNTER — Ambulatory Visit (INDEPENDENT_AMBULATORY_CARE_PROVIDER_SITE_OTHER): Payer: Self-pay | Admitting: Family

## 2024-05-05 VITALS — Ht 73.0 in | Wt 250.0 lb

## 2024-05-05 DIAGNOSIS — S81801A Unspecified open wound, right lower leg, initial encounter: Secondary | ICD-10-CM

## 2024-05-05 DIAGNOSIS — L97521 Non-pressure chronic ulcer of other part of left foot limited to breakdown of skin: Secondary | ICD-10-CM

## 2024-05-05 DIAGNOSIS — L02416 Cutaneous abscess of left lower limb: Secondary | ICD-10-CM

## 2024-05-05 DIAGNOSIS — E11628 Type 2 diabetes mellitus with other skin complications: Secondary | ICD-10-CM

## 2024-05-05 DIAGNOSIS — L089 Local infection of the skin and subcutaneous tissue, unspecified: Secondary | ICD-10-CM

## 2024-05-05 DIAGNOSIS — L03032 Cellulitis of left toe: Secondary | ICD-10-CM

## 2024-05-05 MED ORDER — SULFAMETHOXAZOLE-TRIMETHOPRIM 800-160 MG PO TABS: 1.0000 | ORAL_TABLET | Freq: Two times a day (BID) | ORAL | 0 refills | Status: AC

## 2024-05-05 MED ORDER — MUPIROCIN 2 % EX OINT: 1.0000 | TOPICAL_OINTMENT | Freq: Every day | CUTANEOUS | 0 refills | Status: AC

## 2024-05-05 NOTE — Progress Notes (Signed)
 Post-Op Visit Note   Patient: Zachary Schneider           Date of Birth: 22-Oct-1970           MRN: [id number] Visit Date: 05/05/2024 PCP: Pcp, No  Chief Complaint:  Chief Complaint  Patient presents with   Right Leg - Routine Post Op    03/20/2024 I&D RLE    HPI:  HPI The patient is a 53 year old gentleman seen in follow-up status post I&D right leg in September of this year he has been doing Vashe to dry dressing changes has returned to work he has no concerns of the right lower extremity  The left lower extremity he reports he has had redness and peeling of the skin to the second toe for about a week now he denies any warmth or associated pain Ortho Exam On examination left foot he has Wagner grade 1 ulcer beneath the IP joint of the second toe this is 5 mm in diameter 3 mm deep there is hyperkeratotic tissue circumferentially.  He does have some mild erythema with peeling skin  On examination right lower extremity there is trace edema the ulcers are gradually improving the areas of eschar were debrided with the scalpel today.  Patient tolerated well.  Please see attached images the largest area of ulceration distally is now 10 cm x 3.2 cm no sign of cellulitis on the right  Visit Diagnoses: No diagnosis found.  Plan: Placed on Bactrim  he will use mupirocin  for the left second toe ulcer continue Vashe to dry dressing changes on the right and follow-up in 2 weeks  Follow-Up Instructions: Return in about 2 weeks (around 05/19/2024).   Imaging: No results found.  Orders:  No orders of the defined types were placed in this encounter.  No orders of the defined types were placed in this encounter.    PMFS History: Patient Active Problem List   Diagnosis Date Noted   Abscess of left lower leg 03/18/2024   Hyperglycemia    Cellulitis of right lower extremity    Uncontrolled type 2 diabetes mellitus with hyperglycemia (HCC) 12/18/2017   Diabetic foot infection (HCC) 12/18/2017    Sepsis due to undetermined organism (HCC) 12/18/2017   Diabetic foot ulcer (HCC) 12/16/2017   Type 2 diabetes mellitus (HCC) 12/16/2017   Tobacco use 12/16/2017   BMI 39.0-39.9,adult 12/16/2017   SBO (small bowel obstruction) (HCC) 07/14/2017   Ileus (HCC) 07/14/2017   Past Medical History:  Diagnosis Date   Cancer (HCC)    testicular   Diabetes mellitus without complication (HCC)    Obese    Tobacco use     Family History  Problem Relation Age of Onset   Breast cancer Mother    Multiple sclerosis Father    Diabetes Mellitus II Paternal Grandmother    Lupus Paternal Grandmother     Past Surgical History:  Procedure Laterality Date   INCISION AND DRAINAGE OF DEEP ABSCESS, CALF Right 03/20/2024   Procedure: INCISION AND DRAINAGE OF DEEP ABSCESS, CALF;  Surgeon: Harden Jerona GAILS, MD;  Location: [facility] OR;  Service: Orthopedics;  Laterality: Right;  IRRIGATION AND DEBRIDEMENT WOUND RIGHT LEG   INCISION AND DRAINAGE OF WOUND Right 12/18/2017   Procedure: DEBRIDEMENT OF INFECTED DIABETIC FOOT ULCER RIGHT FOOT;  Surgeon: Blinda Katz, DPM;  Location: [facility];  Service: Podiatry;  Laterality: Right;   INCISION AND DRAINAGE OF WOUND Right 03/18/2024   Procedure: IRRIGATION AND DEBRIDEMENT WOUND;  Surgeon: Harden Jerona GAILS,  MD;  Location: [facility] OR;  Service: Orthopedics;  Laterality: Right;  IRRIGATION AND DEBRIDEMENT RIGHT LEG   KNEE SURGERY     TOE SURGERY Right    Social History   Occupational History   Not on file  Tobacco Use   Smoking status: Some Days    Current packs/day: 0.50    Types: Cigarettes   Smokeless tobacco: Never  Vaping Use   Vaping status: Never Used  Substance and Sexual Activity   Alcohol use: No   Drug use: No   Sexual activity: Not on file

## 2024-05-19 ENCOUNTER — Ambulatory Visit (INDEPENDENT_AMBULATORY_CARE_PROVIDER_SITE_OTHER): Payer: Self-pay | Admitting: Family

## 2024-05-19 DIAGNOSIS — L03115 Cellulitis of right lower limb: Secondary | ICD-10-CM

## 2024-05-19 DIAGNOSIS — S81801A Unspecified open wound, right lower leg, initial encounter: Secondary | ICD-10-CM

## 2024-05-20 NOTE — Progress Notes (Signed)
 Post-Op Visit Note   Patient: Zachary Schneider           Date of Birth: Mar 22, 1971           MRN: [id number] Visit Date: 05/19/2024 PCP: Pcp, No  Chief Complaint:  Chief Complaint  Patient presents with   Right Leg - Follow-up    03/20/2024 I&D RLE    HPI:  HPI The patient is a 53 year old gentleman seen in follow-up status post I&D right leg in September of this year he has been doing Vashe to dry dressing changes has returned to work he has no concerns of the right lower extremity  Ortho Exam On examination right lower extremity there is trace edema the ulcers are gradually improving the areas of eschar were debrided with the scalpel today.  Patient tolerated well.  Please see attached images the largest area of ulceration distally is now 8.8 cm x 3.0 cm no sign of cellulitis on the right  Visit Diagnoses: No diagnosis found.  Plan: continue Vashe to dry dressing changes on the right and follow-up in 2 weeks  Follow-Up Instructions: No follow-ups on file.   Imaging: No results found.  Orders:  No orders of the defined types were placed in this encounter.  No orders of the defined types were placed in this encounter.    PMFS History: Patient Active Problem List   Diagnosis Date Noted   Abscess of left lower leg 03/18/2024   Hyperglycemia    Cellulitis of right lower extremity    Uncontrolled type 2 diabetes mellitus with hyperglycemia (HCC) 12/18/2017   Diabetic foot infection (HCC) 12/18/2017   Sepsis due to undetermined organism (HCC) 12/18/2017   Diabetic foot ulcer (HCC) 12/16/2017   Type 2 diabetes mellitus (HCC) 12/16/2017   Tobacco use 12/16/2017   BMI 39.0-39.9,adult 12/16/2017   SBO (small bowel obstruction) (HCC) 07/14/2017   Ileus (HCC) 07/14/2017   Past Medical History:  Diagnosis Date   Cancer (HCC)    testicular   Diabetes mellitus without complication (HCC)    Obese    Tobacco use     Family History  Problem Relation Age of Onset    Breast cancer Mother    Multiple sclerosis Father    Diabetes Mellitus II Paternal Grandmother    Lupus Paternal Grandmother     Past Surgical History:  Procedure Laterality Date   INCISION AND DRAINAGE OF DEEP ABSCESS, CALF Right 03/20/2024   Procedure: INCISION AND DRAINAGE OF DEEP ABSCESS, CALF;  Surgeon: Harden Jerona GAILS, MD;  Location: [facility] OR;  Service: Orthopedics;  Laterality: Right;  IRRIGATION AND DEBRIDEMENT WOUND RIGHT LEG   INCISION AND DRAINAGE OF WOUND Right 12/18/2017   Procedure: DEBRIDEMENT OF INFECTED DIABETIC FOOT ULCER RIGHT FOOT;  Surgeon: Blinda Katz, DPM;  Location: [facility];  Service: Podiatry;  Laterality: Right;   INCISION AND DRAINAGE OF WOUND Right 03/18/2024   Procedure: IRRIGATION AND DEBRIDEMENT WOUND;  Surgeon: Harden Jerona GAILS, MD;  Location: [facility];  Service: Orthopedics;  Laterality: Right;  IRRIGATION AND DEBRIDEMENT RIGHT LEG   KNEE SURGERY     TOE SURGERY Right    Social History   Occupational History   Not on file  Tobacco Use   Smoking status: Some Days    Current packs/day: 0.50    Types: Cigarettes   Smokeless tobacco: Never  Vaping Use   Vaping status: Never Used  Substance and Sexual Activity   Alcohol use: No   Drug use: No  Sexual activity: Not on file

## 2024-06-08 ENCOUNTER — Encounter: Payer: Self-pay | Admitting: Orthopedic Surgery

## 2024-08-06 ENCOUNTER — Ambulatory Visit: Payer: Self-pay | Admitting: Physician Assistant

## 2025-03-23 ENCOUNTER — Ambulatory Visit: Payer: Self-pay | Admitting: Physician Assistant
